# Patient Record
Sex: Female | Born: 1980
Health system: Southern US, Community
[De-identification: ages and names within clinical notes are randomized; demographics above are authoritative.]

## PROBLEM LIST (undated history)

## (undated) DIAGNOSIS — E7800 Pure hypercholesterolemia, unspecified: Secondary | ICD-10-CM

## (undated) DIAGNOSIS — Z789 Other specified health status: Secondary | ICD-10-CM

## (undated) HISTORY — PX: NO PAST SURGERIES: SHX2092

---

## 2007-06-09 ENCOUNTER — Inpatient Hospital Stay (HOSPITAL_COMMUNITY): Admission: AD | Admit: 2007-06-09 | Discharge: 2007-06-09 | Payer: Self-pay | Admitting: Obstetrics and Gynecology

## 2007-06-09 IMAGING — US US OB COMP LESS 14 WK
1 series · 14 of 28 positions shown · non-contrast
Comparison: none

OBSTETRICAL ULTRASOUND:

 This ultrasound exam was performed in the [HOSPITAL] Ultrasound Department.  The OB US report was generated in the AS system, and faxed to the ordering physician.  This report is also available in [REDACTED] PACS.

[Series 1: us ob comp less 14 wk · 0.26mm/px · 14 of 39 slices shown]
[im 2/39]
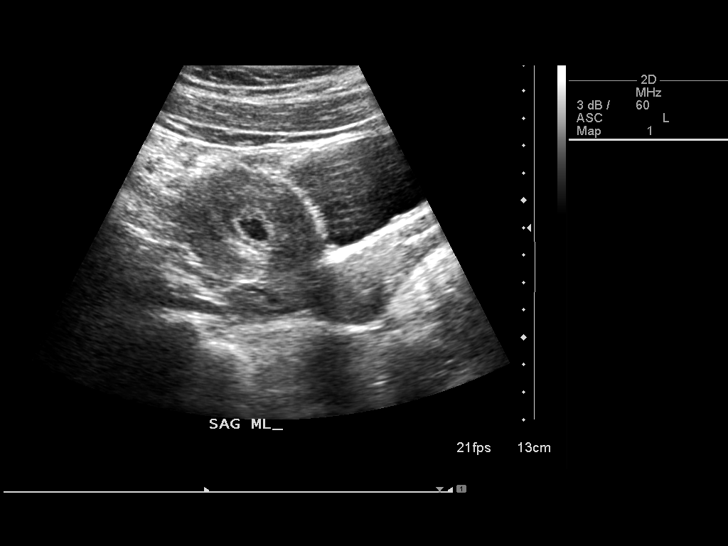
[im 5/39]
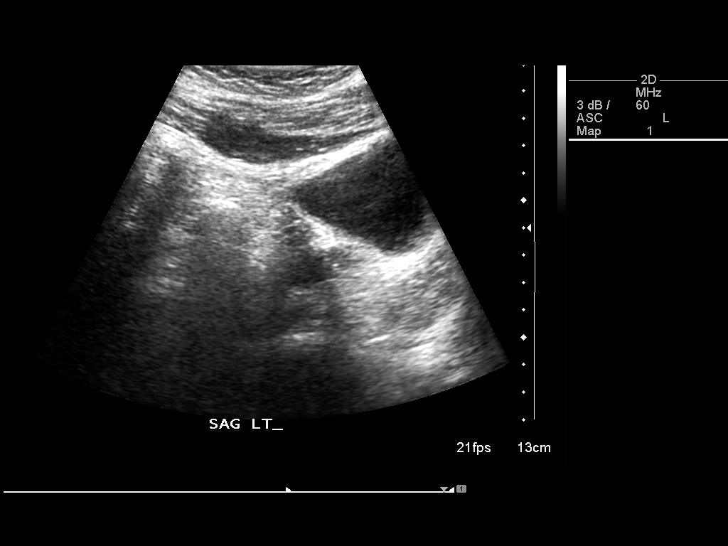
[im 8/39]
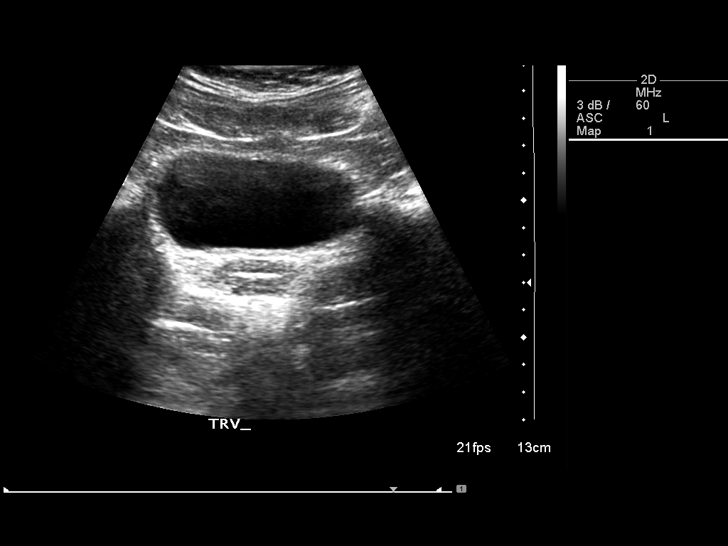
[im 10/39]
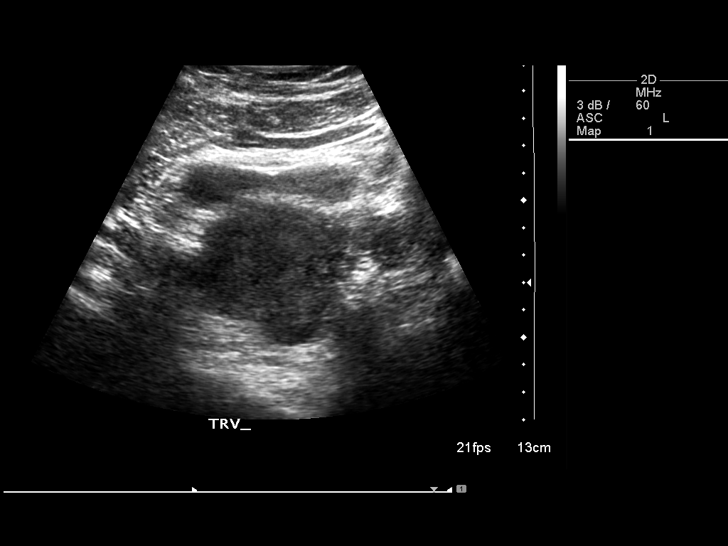
[im 13/39]
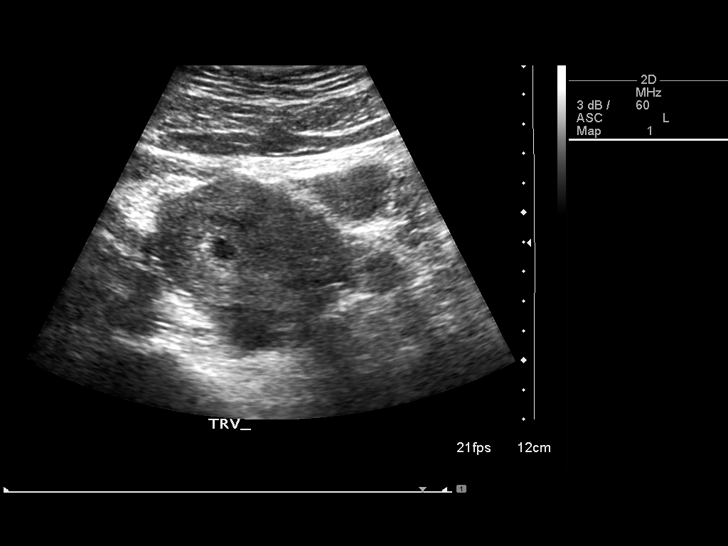
[im 16/39]
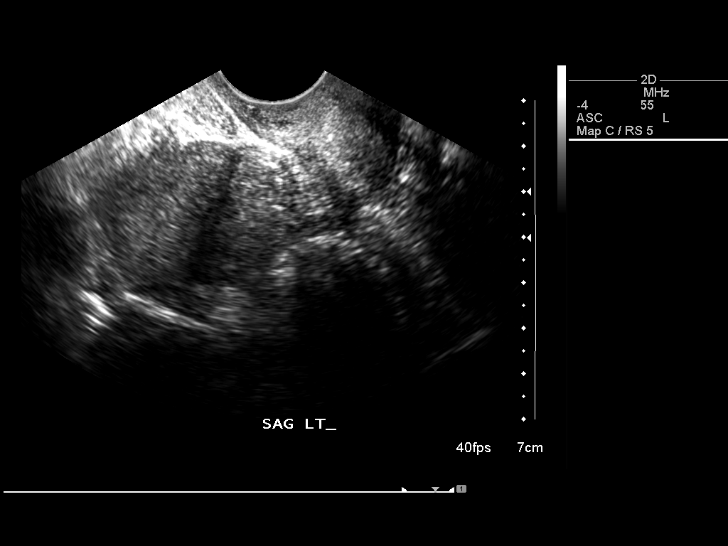
[im 19/39]
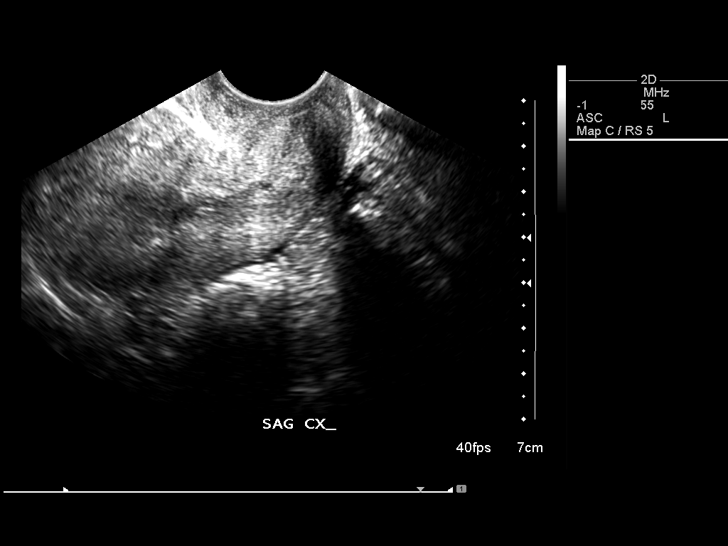
[im 22/39]
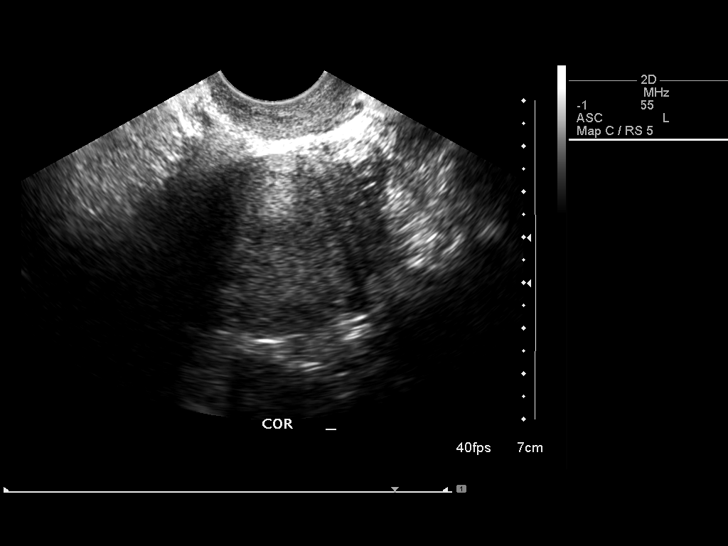
[im 24/39]
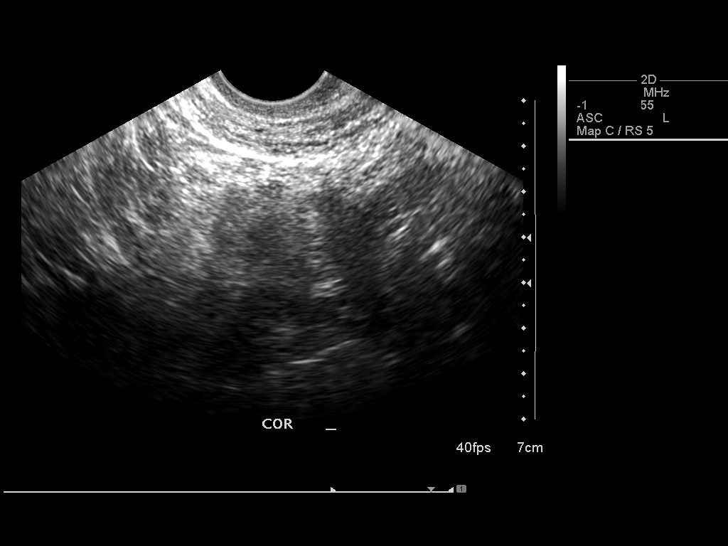
[im 27/39]
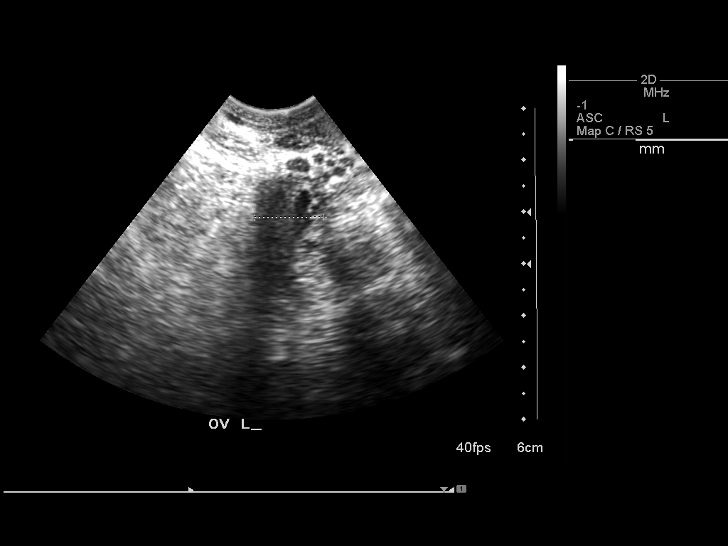
[im 30/39]
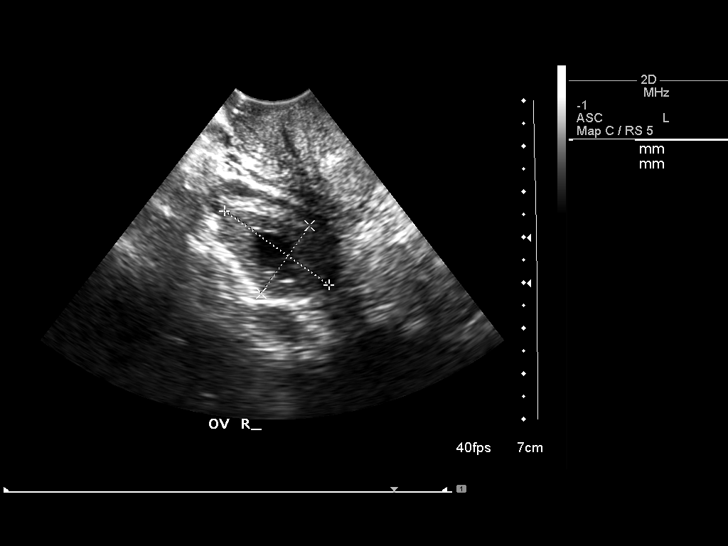
[im 33/39]
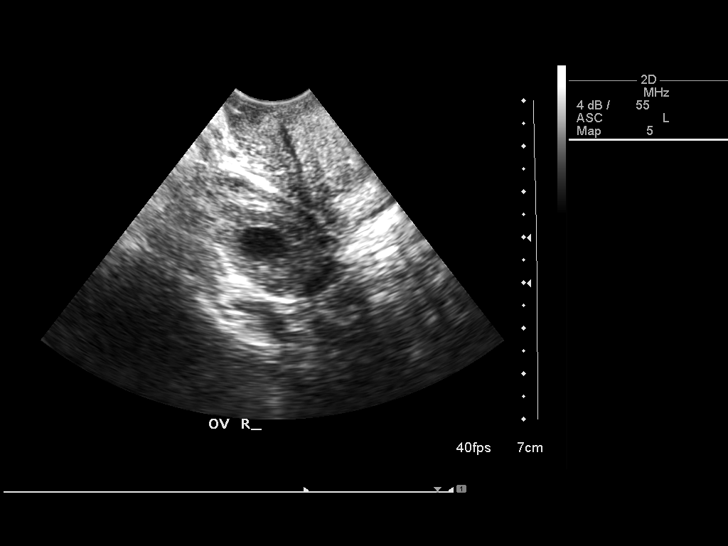
[im 36/39]
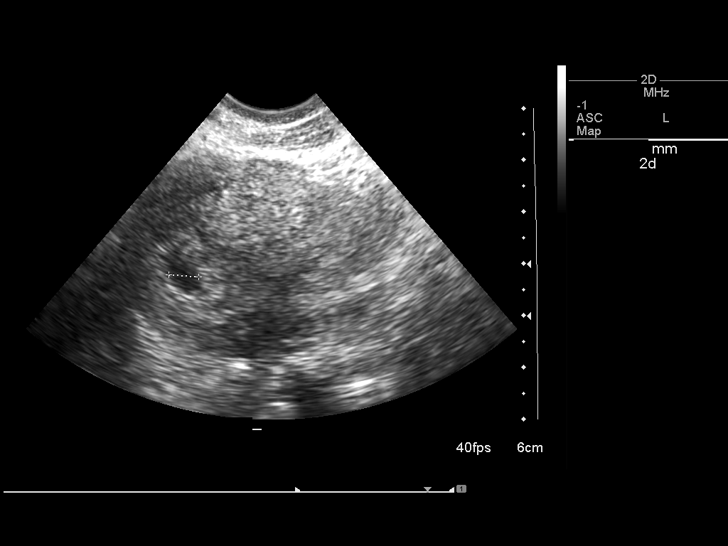
[im 39/39]
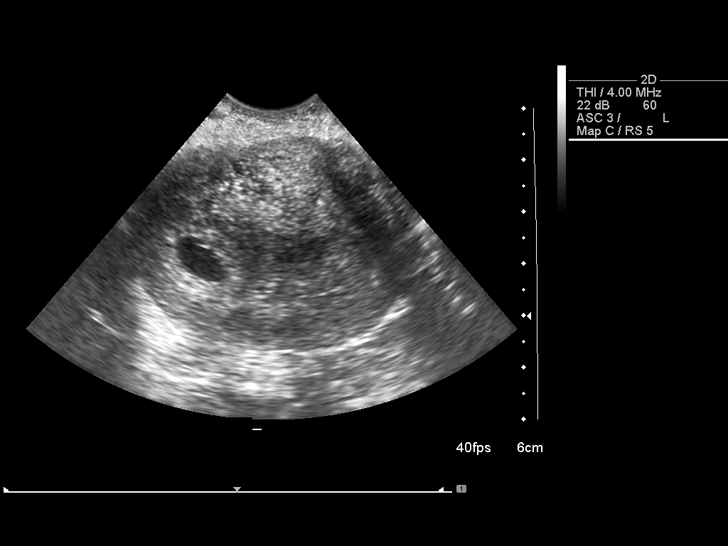

[14 of 28 positions shown; findings below may reference images not displayed]

IMPRESSION: See AS Obstetric US report.

## 2008-01-08 ENCOUNTER — Inpatient Hospital Stay (HOSPITAL_COMMUNITY): Admission: AD | Admit: 2008-01-08 | Discharge: 2008-01-11 | Payer: Self-pay | Admitting: Obstetrics

## 2009-11-13 ENCOUNTER — Emergency Department (HOSPITAL_COMMUNITY): Admission: EM | Admit: 2009-11-13 | Discharge: 2009-11-13 | Payer: Self-pay | Admitting: Emergency Medicine

## 2009-11-13 IMAGING — CR DG ABDOMEN 1V
1 series · 1 of 1 positions shown · non-contrast
Comparison: None

CLINICAL DATA: Left side abdominal pain for 2 years.

ABDOMEN - 1 VIEW

[view not recorded]
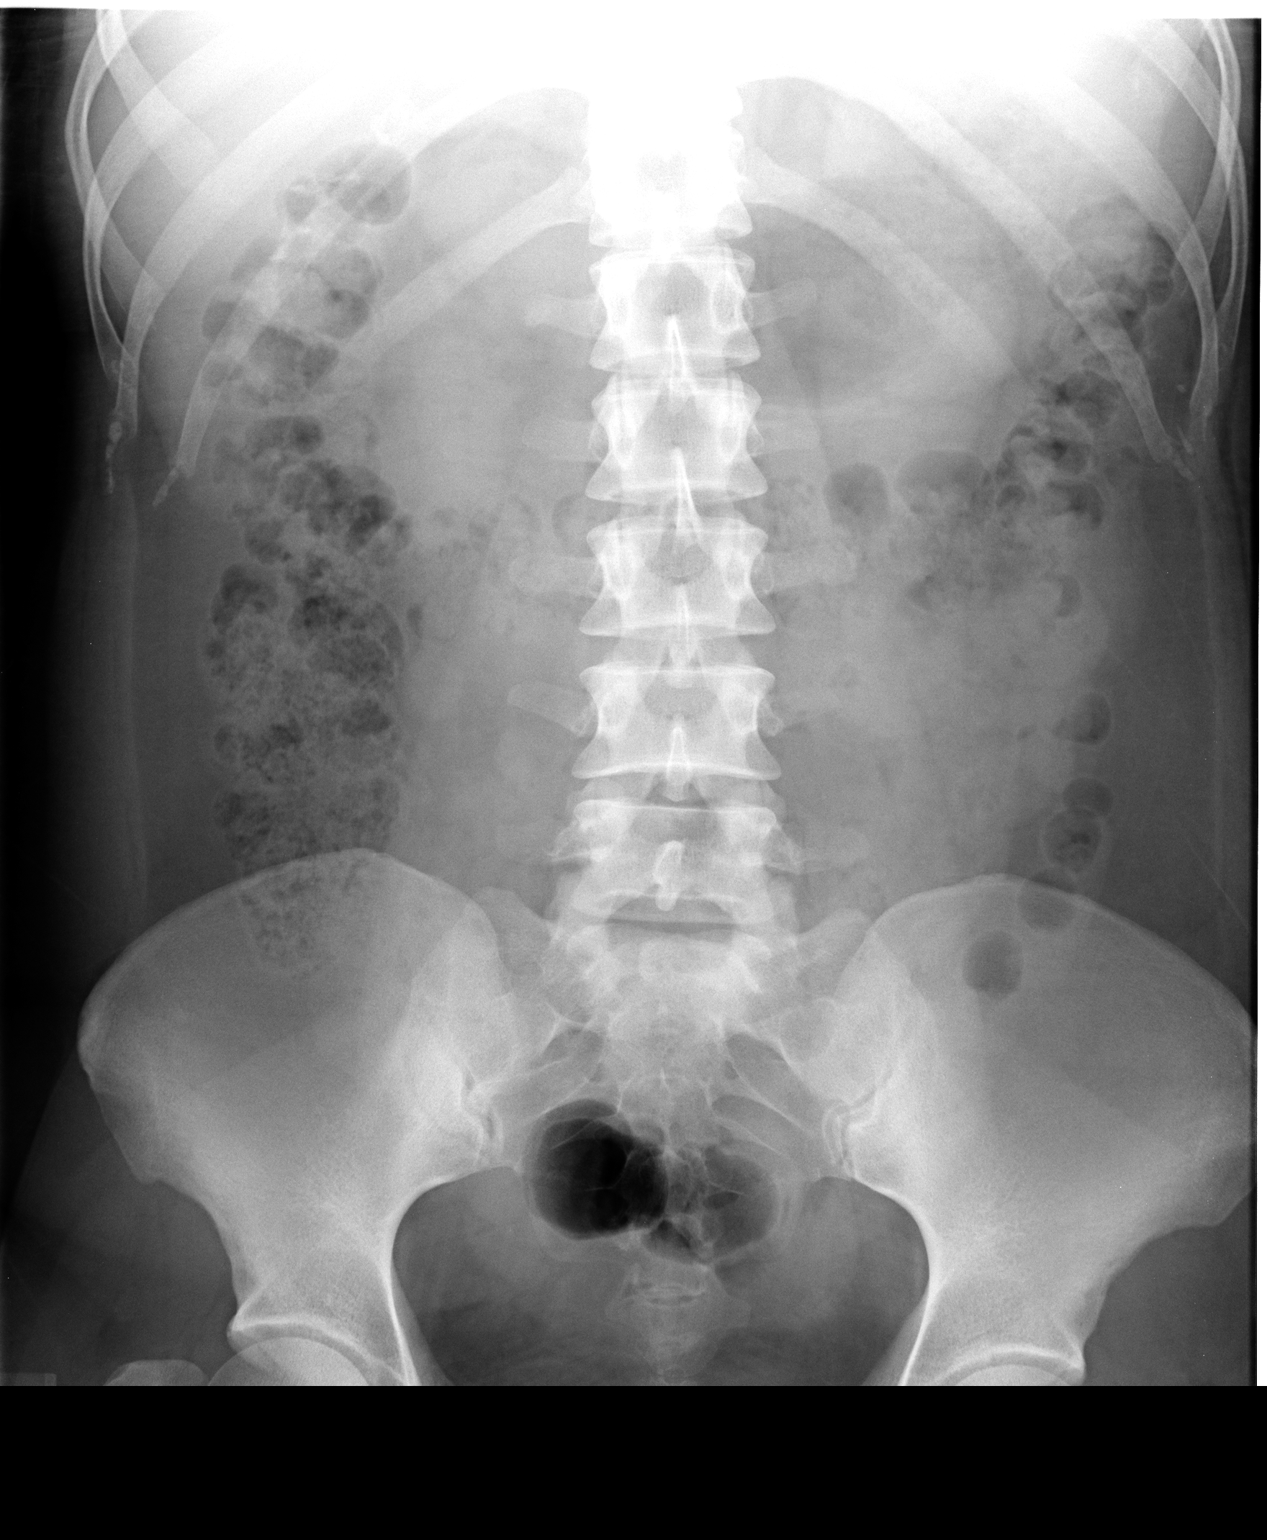

[1 of 1 positions shown; findings below may reference images not displayed]

FINDINGS: Bowel gas pattern is normal.  No ileus or bowel
obstruction.  Moderately generous amounts stool is present in the
colon.  No unusual calcification.  The properitoneal fat stripes
and psoas muscle margins are defined.
IMPRESSION: No acute abdominal process.

## 2010-03-13 ENCOUNTER — Encounter (INDEPENDENT_AMBULATORY_CARE_PROVIDER_SITE_OTHER): Payer: Self-pay | Admitting: Family Medicine

## 2010-03-13 ENCOUNTER — Ambulatory Visit: Payer: Self-pay | Admitting: Internal Medicine

## 2010-03-13 LAB — CONVERTED CEMR LAB
Chlamydia, Swab/Urine, PCR: NEGATIVE
GC Probe Amp, Urine: NEGATIVE

## 2010-03-18 ENCOUNTER — Ambulatory Visit (HOSPITAL_COMMUNITY): Admission: RE | Admit: 2010-03-18 | Discharge: 2010-03-18 | Payer: Self-pay | Admitting: Internal Medicine

## 2010-03-18 IMAGING — CT CT ABD-PELV W/O CM
1 of 2 series · 16 of 32 positions shown, 20 images · non-contrast
Comparison: None.

CLINICAL DATA: Left lower quadrant pain and hematuria.

CT ABDOMEN AND PELVIS WITHOUT CONTRAST
TECHNIQUE: Multidetector CT imaging of the abdomen and pelvis was
performed following the standard protocol without intravenous
contrast.

[Series 2: stone · axial · 0.75mm/px · z∈[-490,-40]mm · 16 of 98 slices shown, 20 images]
[im 4/98  soft-tissue]
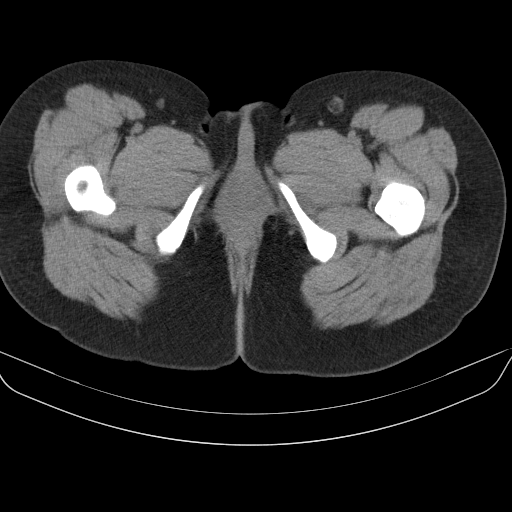
[im 4/98  bone]
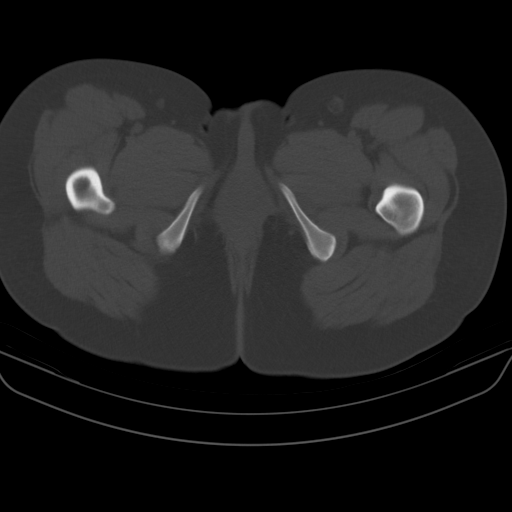
[im 12/98  soft-tissue]
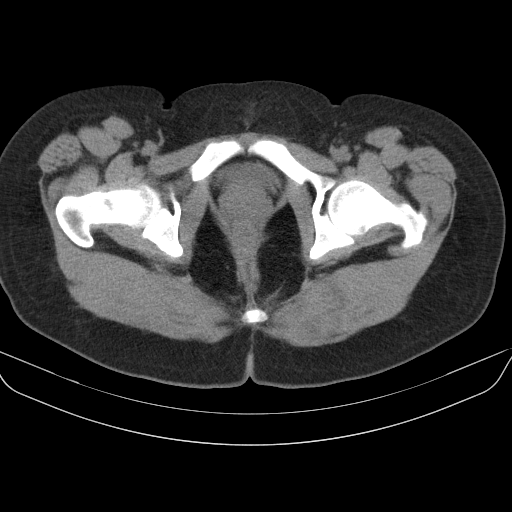
[im 19/98  soft-tissue]
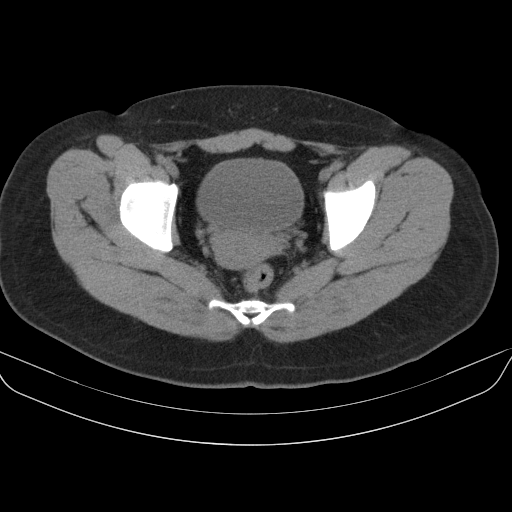
[im 27/98  soft-tissue]
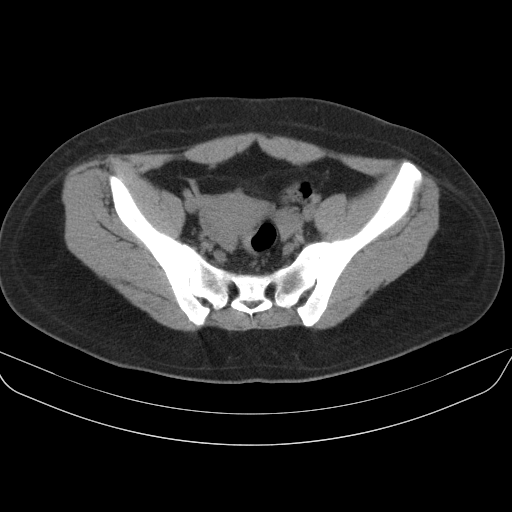
[im 34/98  soft-tissue]
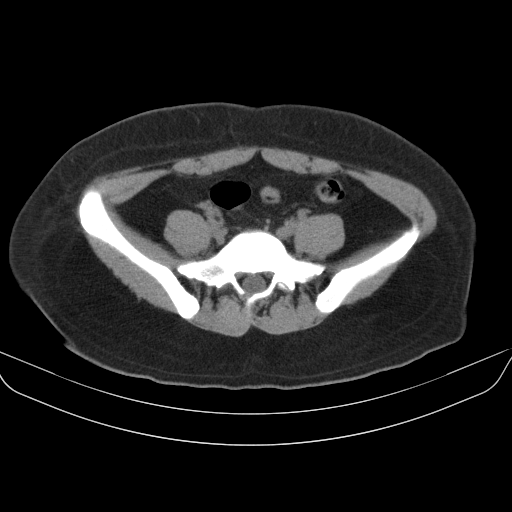
[im 38/98  soft-tissue]
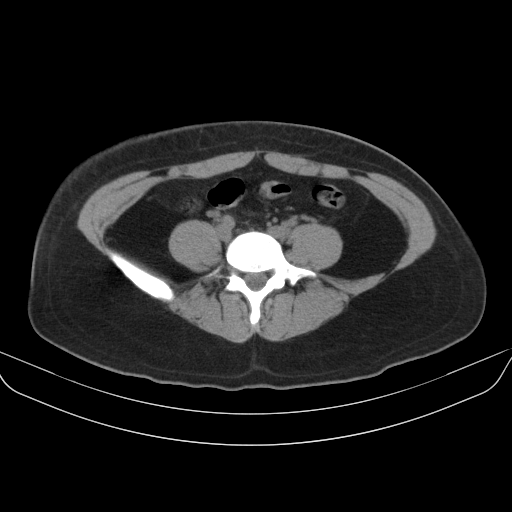
[im 45/98  soft-tissue]
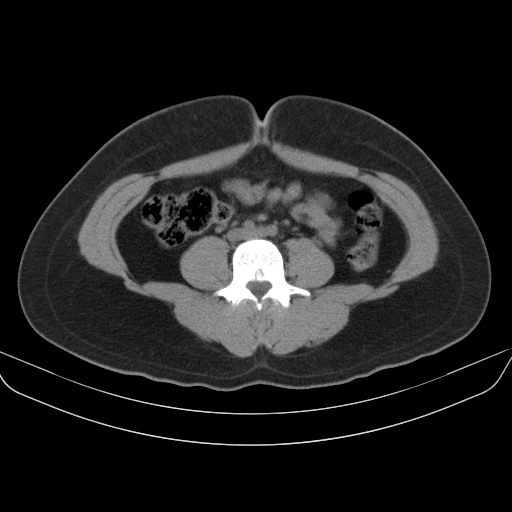
[im 53/98  soft-tissue]
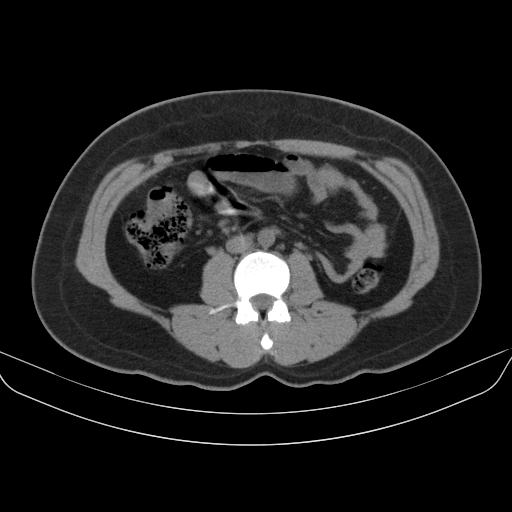
[im 60/98  soft-tissue]
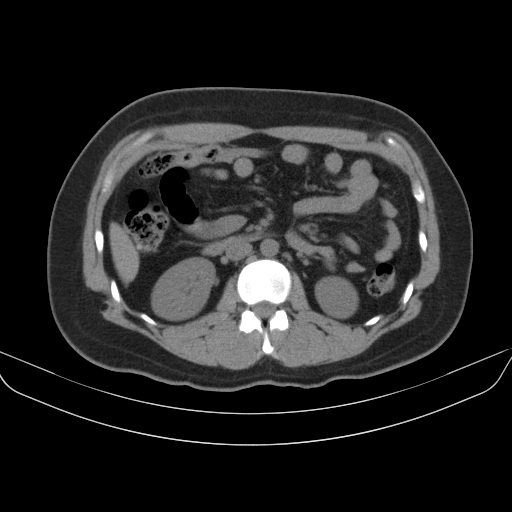
[im 60/98  bone]
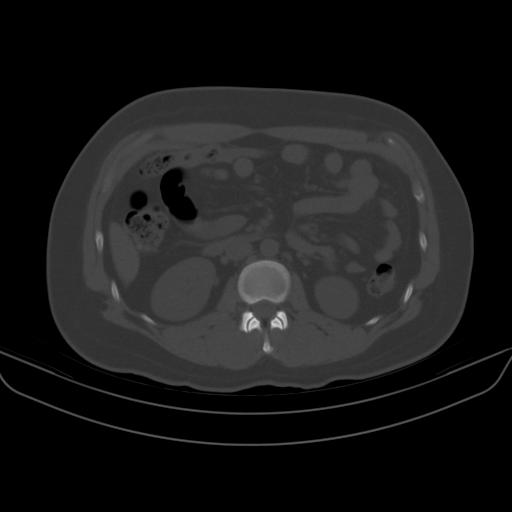
[im 64/98  soft-tissue]
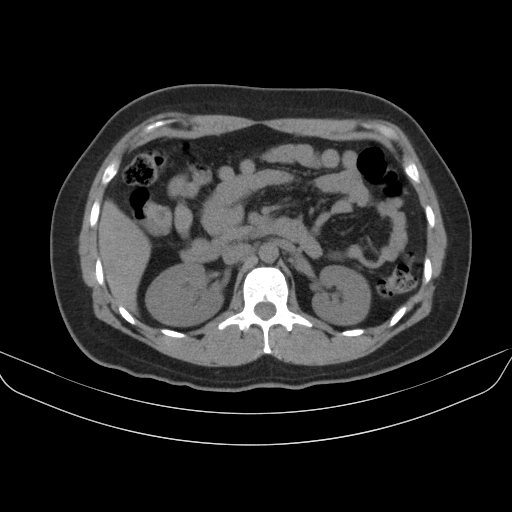
[im 71/98  soft-tissue]
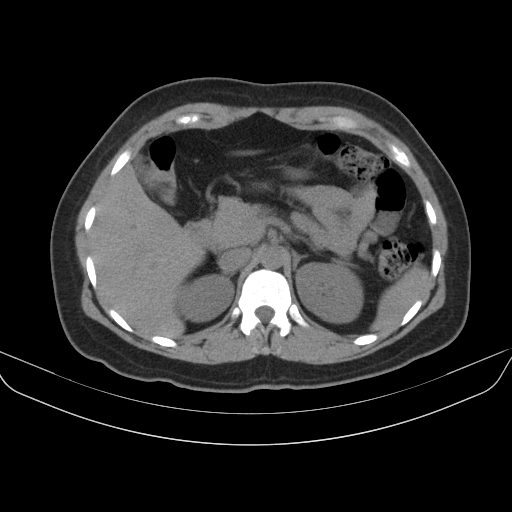
[im 79/98  soft-tissue]
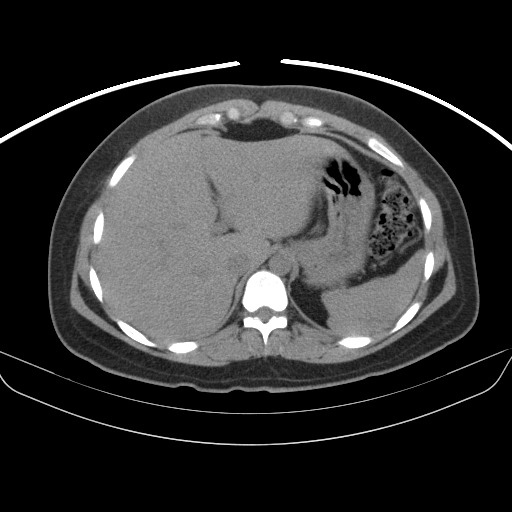
[im 83/98  lung]
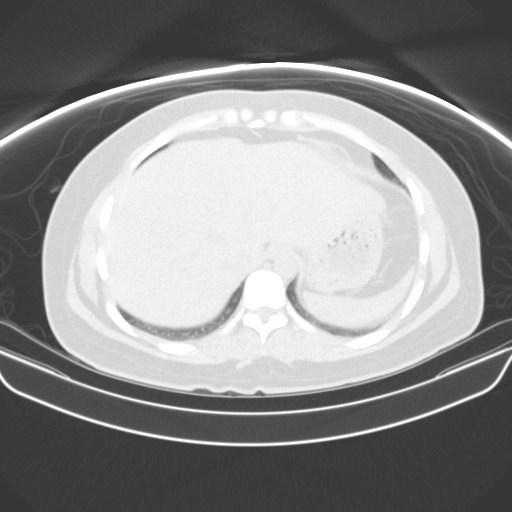
[im 86/98  soft-tissue]
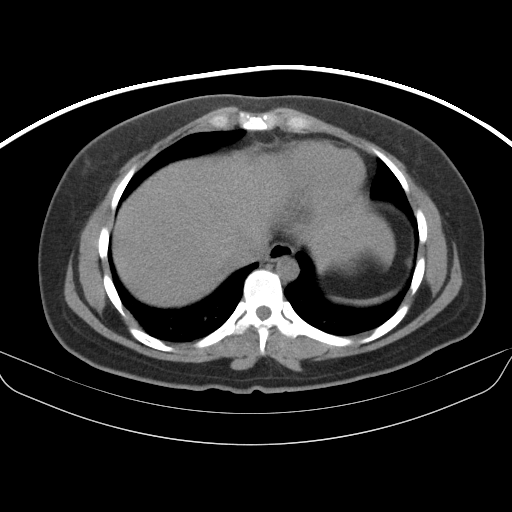
[im 86/98  lung]
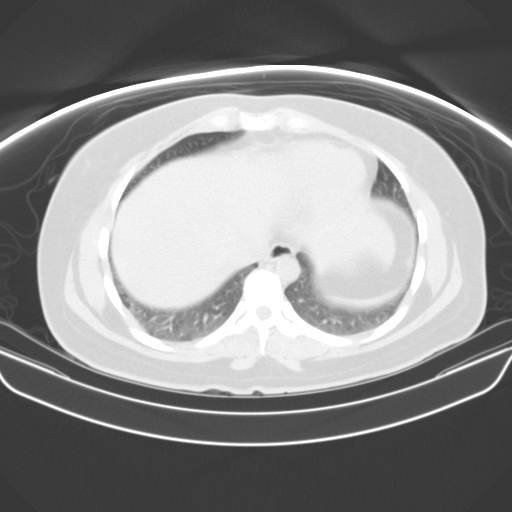
[im 90/98  lung]
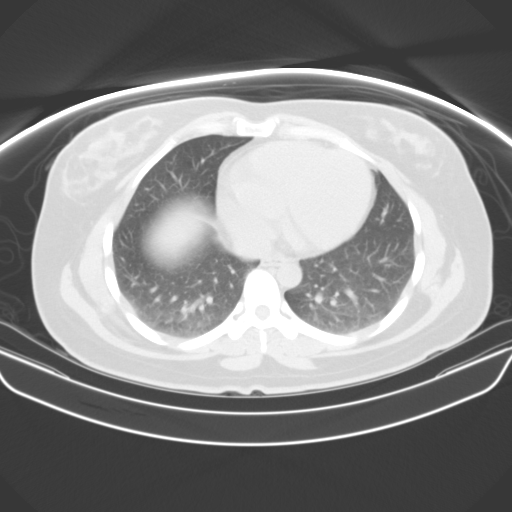
[im 94/98  soft-tissue]
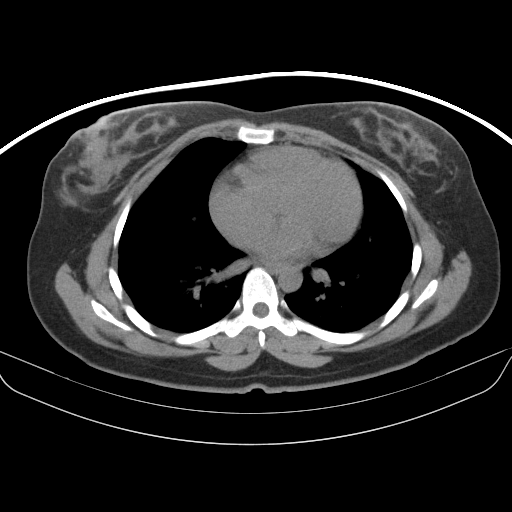
[im 94/98  lung]
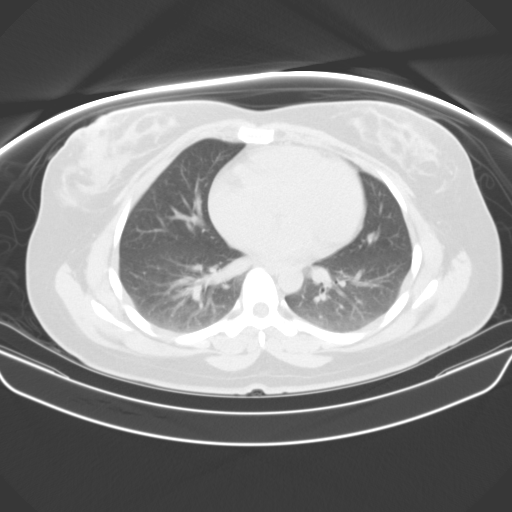

[16 of 32 positions shown; findings below may reference images not displayed]

FINDINGS: Lung bases show mild dependent atelectasis.  Heart is at
the upper limits of normal in size.  No pericardial or pleural
effusion.

Liver, gallbladder, adrenal glands, kidneys, spleen, pancreas,
stomach and bowel are unremarkable.  A 6 mm low attenuation lesion
in the region of the cervix may represent a Nabothian cyst.
Ovaries are visualized.  No pathologically enlarged lymph nodes.
No free fluid.  Bladder is unremarkable.  No worrisome lytic or
sclerotic lesions.
IMPRESSION: No acute findings.  No urinary stones or hydronephrosis.

## 2010-03-20 ENCOUNTER — Encounter (INDEPENDENT_AMBULATORY_CARE_PROVIDER_SITE_OTHER): Payer: Self-pay | Admitting: Family Medicine

## 2010-03-20 ENCOUNTER — Ambulatory Visit: Payer: Self-pay | Admitting: Internal Medicine

## 2010-03-20 LAB — CONVERTED CEMR LAB
ALT: 20 U/L
AST: 20 U/L
Albumin: 4.3 g/dL
Alkaline Phosphatase: 84 U/L
BUN: 13 mg/dL
Basophils Absolute: 0 K/uL
Basophils Relative: 0 %
CO2: 22 meq/L
Calcium: 8.9 mg/dL
Chloride: 105 meq/L
Cholesterol: 167 mg/dL
Creatinine, Ser: 0.71 mg/dL
Eosinophils Absolute: 0.1 K/uL
Eosinophils Relative: 2 %
Glucose, Bld: 90 mg/dL
HCT: 39.6 %
HDL: 43 mg/dL
Hemoglobin: 12.2 g/dL
LDL Cholesterol: 95 mg/dL
Lymphocytes Relative: 32 %
Lymphs Abs: 2.2 K/uL
MCHC: 30.8 g/dL
MCV: 87.6 fL
Monocytes Absolute: 0.6 K/uL
Monocytes Relative: 9 %
Neutro Abs: 4.1 K/uL
Neutrophils Relative %: 58 %
Platelets: 306 K/uL
Potassium: 4.2 meq/L
RBC: 4.52 M/uL
RDW: 14 %
Sed Rate: 8 mm/h
Sodium: 141 meq/L
Total Bilirubin: 0.3 mg/dL
Total CHOL/HDL Ratio: 3.9
Total Protein: 7.3 g/dL
Triglycerides: 145 mg/dL
VLDL: 29 mg/dL
WBC: 7 10*3/microliter

## 2010-03-26 ENCOUNTER — Encounter (INDEPENDENT_AMBULATORY_CARE_PROVIDER_SITE_OTHER): Payer: Self-pay | Admitting: Family Medicine

## 2010-03-26 ENCOUNTER — Ambulatory Visit: Payer: Self-pay | Admitting: Internal Medicine

## 2010-03-26 LAB — CONVERTED CEMR LAB
CRP: 3.6 mg/dL — ABNORMAL HIGH (ref <0.6)
Chlamydia, Swab/Urine, PCR: NEGATIVE
GC Probe Amp, Urine: NEGATIVE
Lipase: 21 units/L (ref 0–75)
Sed Rate: 20 mm/hr (ref 0–22)
TSH: 2.349 microintl units/mL (ref 0.350–4.500)

## 2010-04-03 ENCOUNTER — Ambulatory Visit (HOSPITAL_COMMUNITY): Admission: RE | Admit: 2010-04-03 | Discharge: 2010-04-03 | Payer: Self-pay | Admitting: Family Medicine

## 2010-04-03 IMAGING — US US PELVIS COMPLETE MODIFY
1 series · 13 of 25 positions shown · non-contrast
Comparison: None.

04/04/2010 - DUPLICATE COPY for exam association in [facility] – No change from original report.
CLINICAL DATA: Bilateral pelvic pain. Oligomenorrhea. LMP
 12/25/2009

 TRANSABDOMINAL AND TRANSVAGINAL ULTRASOUND OF PELVIS
TECHNIQUE: Both transabdominal and transvaginal ultrasound
 examinations of the pelvis were performed including evaluation of
 the uterus, ovaries, adnexal regions, and pelvic cul-de-sac.

[Series 1: us pelvis complete modify · 0.23mm/px · 13 of 60 slices shown]
[im 1/60]
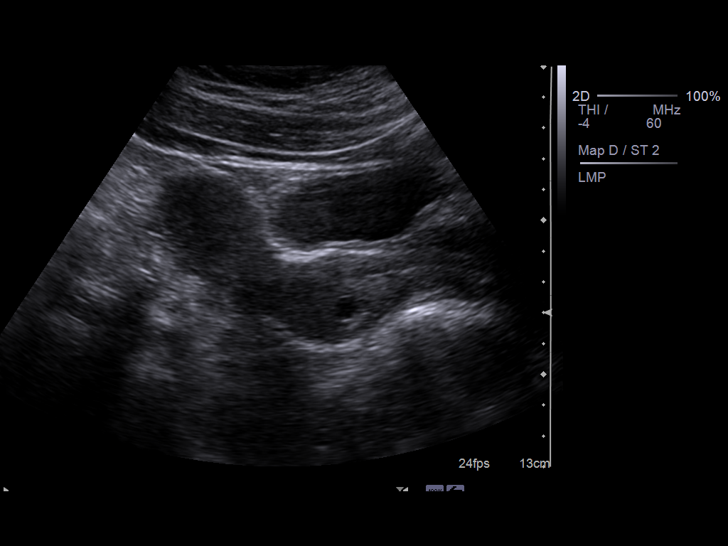
[im 5/60]
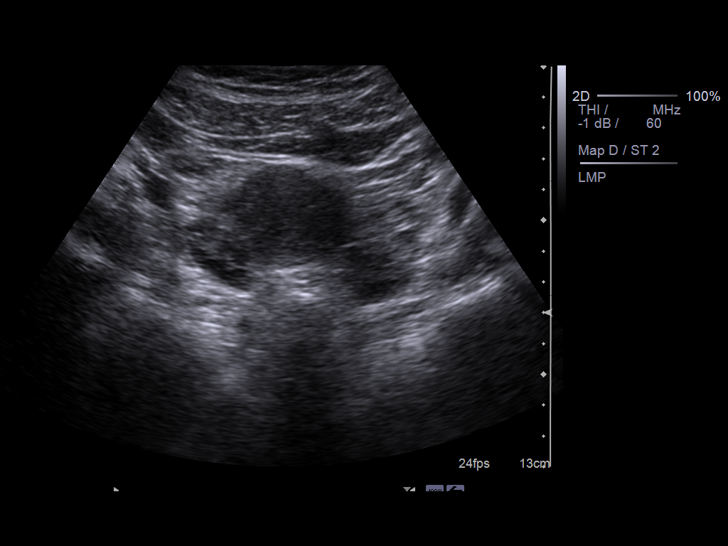
[im 10/60]
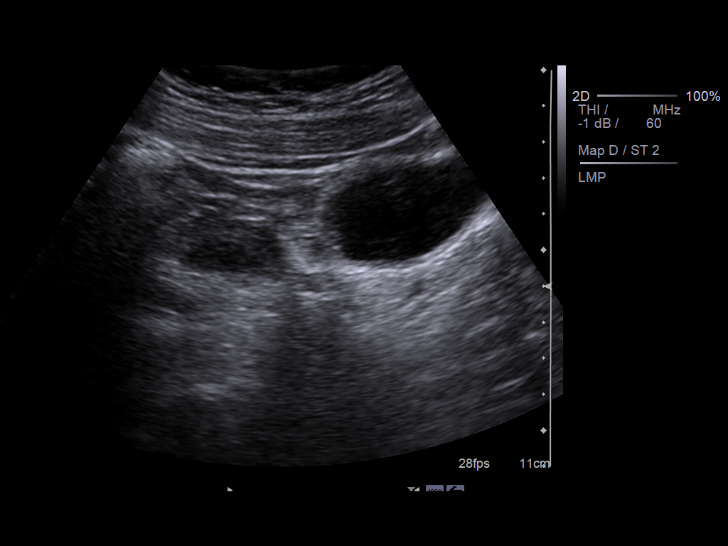
[im 15/60]
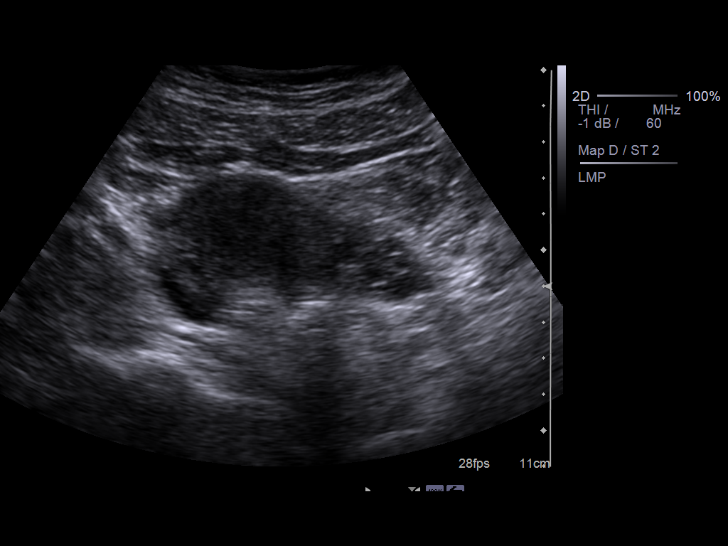
[im 20/60]
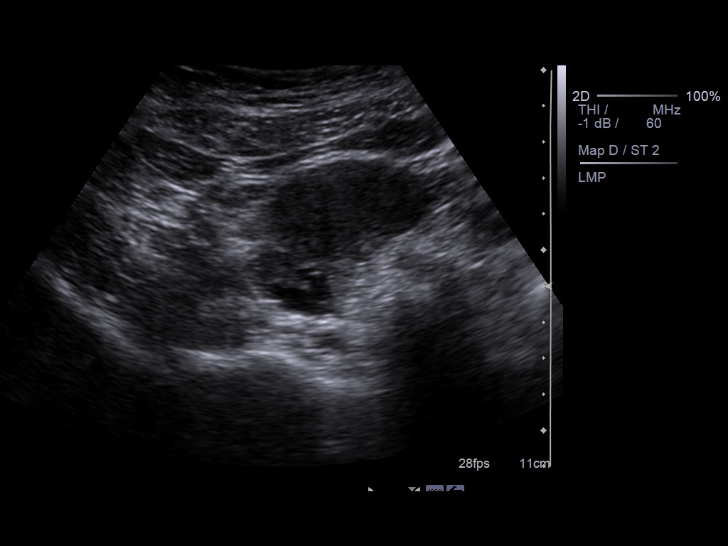
[im 25/60]
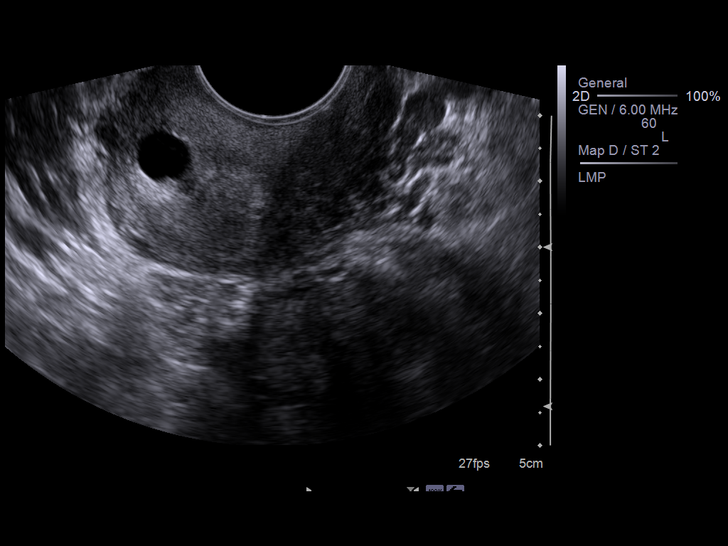
[im 30/60]
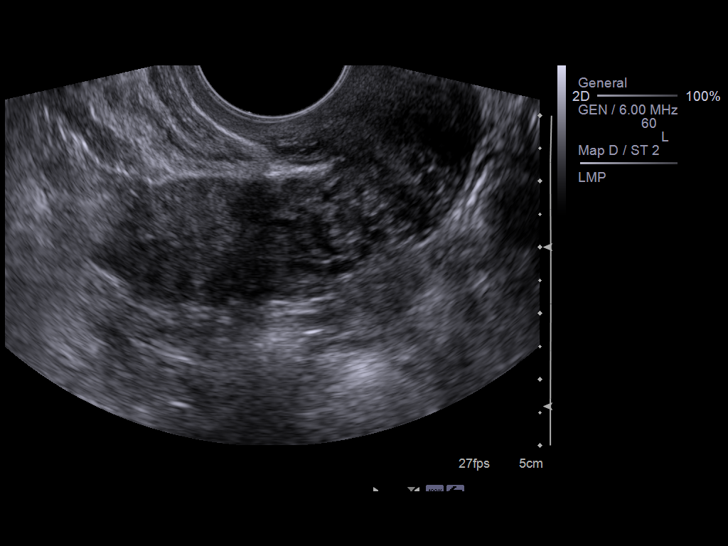
[im 35/60]
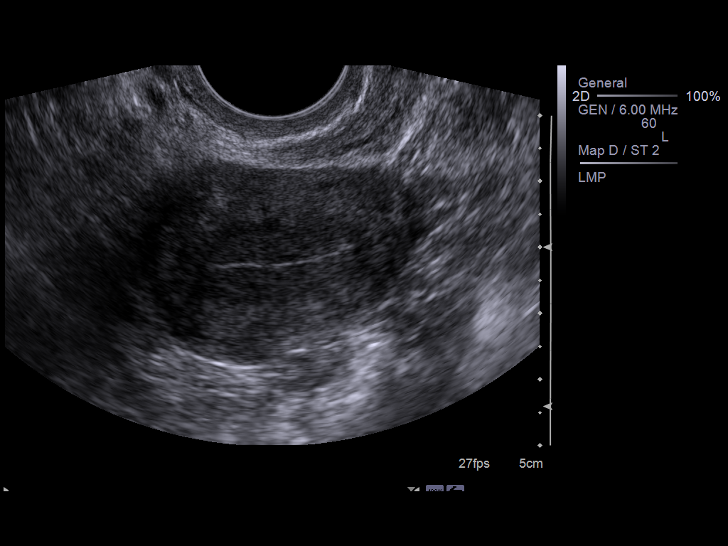
[im 40/60]
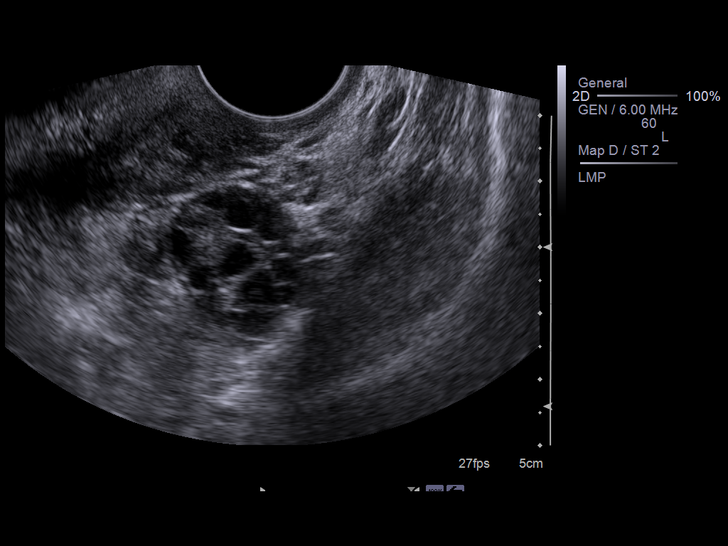
[im 45/60]
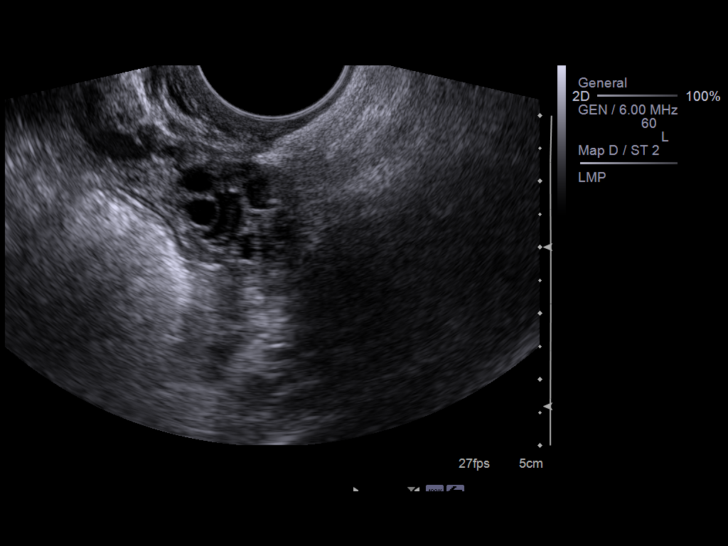
[im 50/60]
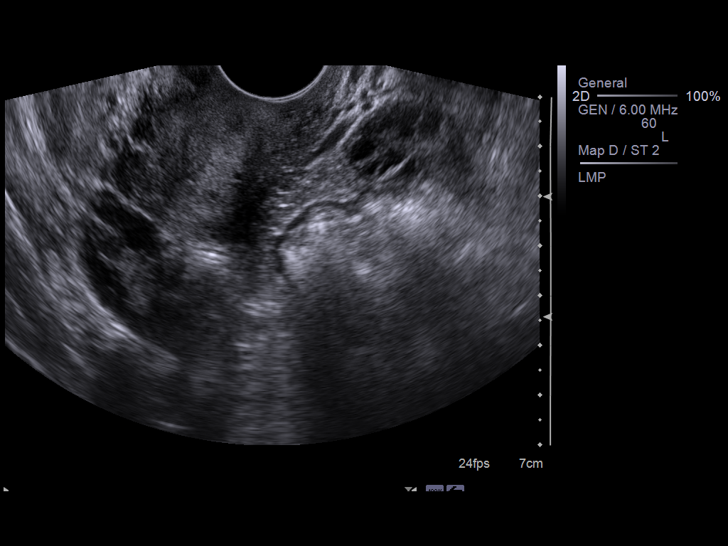
[im 55/60]
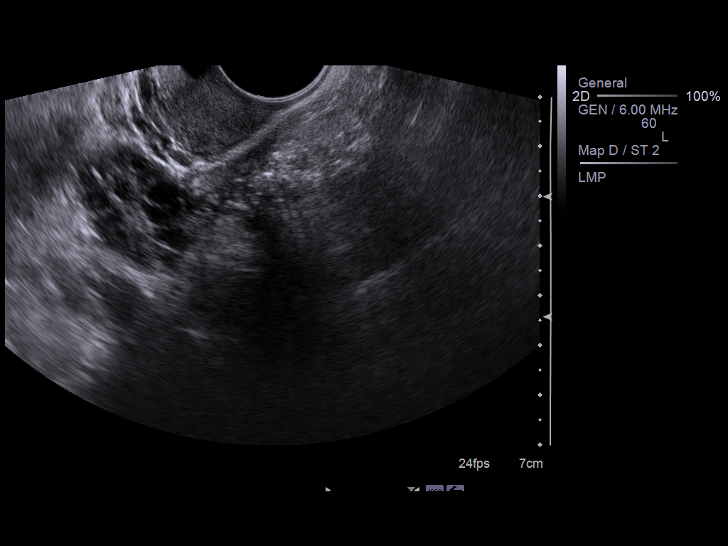
[im 60/60]
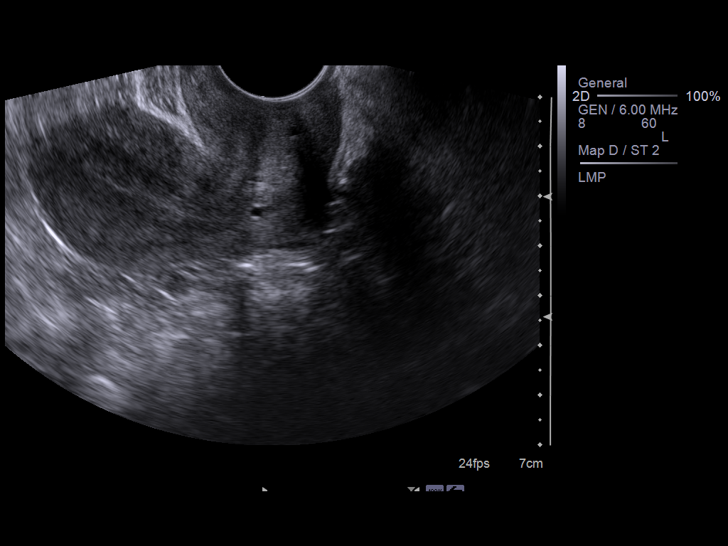

[13 of 25 positions shown; findings below may reference images not displayed]

FINDINGS: Uterus measures 7.1 x 3.0 x 4.0 cm. No fibroids or other uterine
 masses identified.

 Endometrium measures 8 mm in thickness. Within normal limits in
 appearance.

 Right Ovary measures 2.9 x 1.9 x 1.7 cm. Numerous small less than 1
 cm follicles, without dominant follicle or corpus luteum. No
 evidence of ovarian mass.

 Left Ovary measures 2.8 x 2.4 x 2.1 cm. Numerous small less than 1
 cm follicles, without dominant follicle or corpus luteum. No
 evidence of ovarian mass.

 Other Findings: No other abnormality identified.
IMPRESSION: 1. No evidence of pelvic mass or other acute findings.
 2. Numerous small bilateral ovarian follicles, without evidence of
 dominant follicle or corpus luteum. These findings can be seen
 with polycystic ovary syndrome; recommend clinical correlation and
 consider biochemical testing.

## 2010-05-20 ENCOUNTER — Ambulatory Visit: Payer: Self-pay | Admitting: Internal Medicine

## 2010-06-27 ENCOUNTER — Encounter (INDEPENDENT_AMBULATORY_CARE_PROVIDER_SITE_OTHER): Payer: Self-pay | Admitting: *Deleted

## 2010-06-27 ENCOUNTER — Ambulatory Visit: Payer: Self-pay | Admitting: Obstetrics and Gynecology

## 2010-06-27 LAB — CONVERTED CEMR LAB: hCG, Beta Chain, Quant, S: 137108.2 milliintl units/mL

## 2010-07-02 ENCOUNTER — Ambulatory Visit (HOSPITAL_COMMUNITY): Admission: RE | Admit: 2010-07-02 | Discharge: 2010-07-02 | Payer: Self-pay | Admitting: Family Medicine

## 2010-07-02 IMAGING — US US OB COMP LESS 14 WK
1 series · 14 of 28 positions shown · non-contrast
Comparison: none

OBSTETRICAL ULTRASOUND:
 This ultrasound exam was performed in the [HOSPITAL] Ultrasound Department.  The OB US report was generated in the AS system, and faxed to the ordering physician.  This report is also available in [HOSPITAL]?s Access[employer] and in [REDACTED] PACS.

[Series 1: us ob transvaginal modify · 0.17mm/px · 32 acquisitions, 14 frames shown]
[im 2/32]
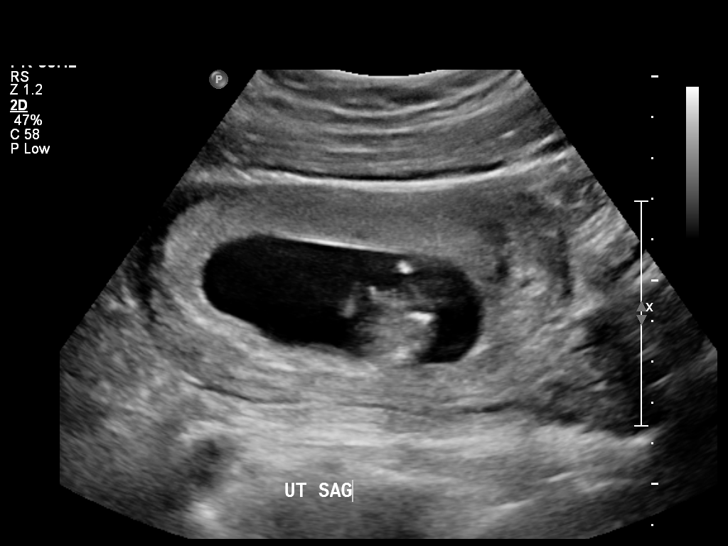
[im 4/32]
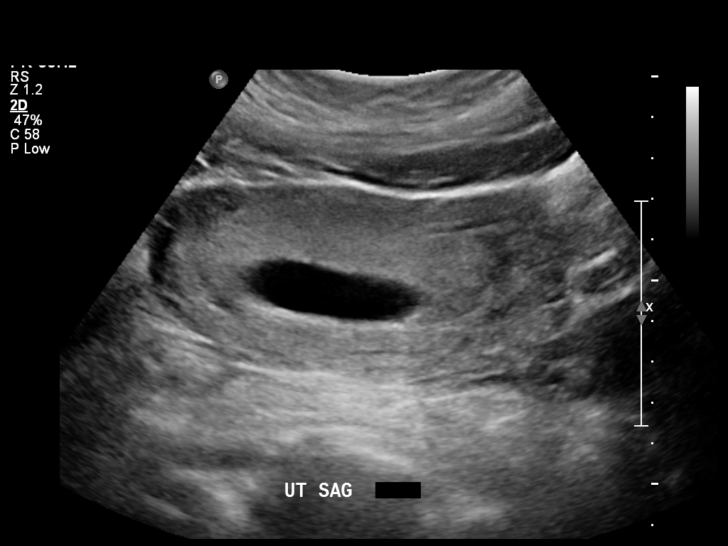
[im 6/32]
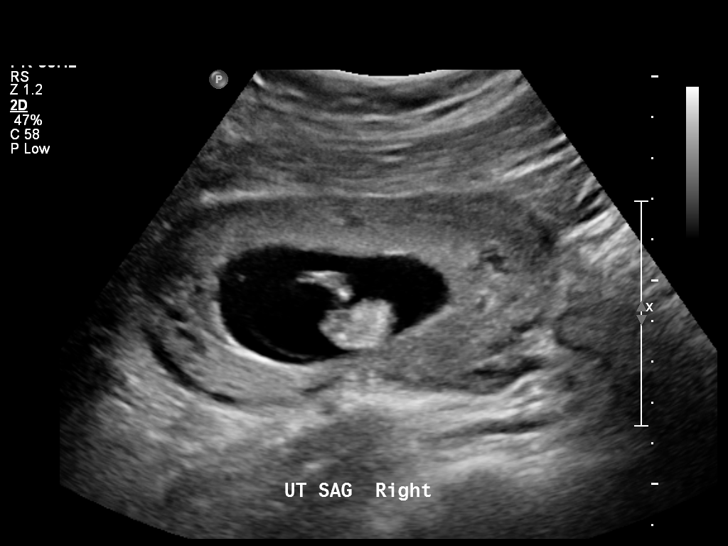
[im 9/32]
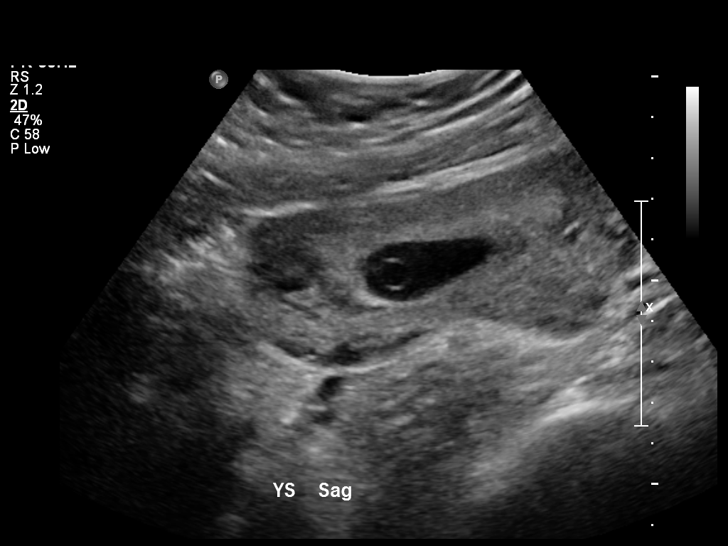
[im 11/32]
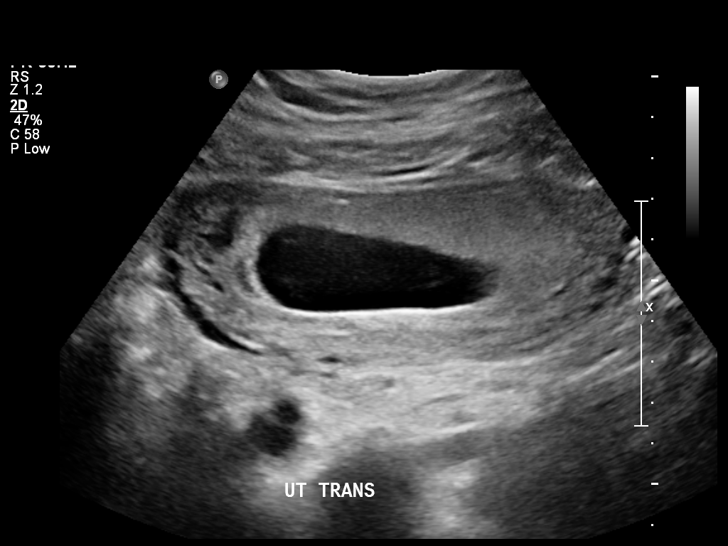
[im 13/32]
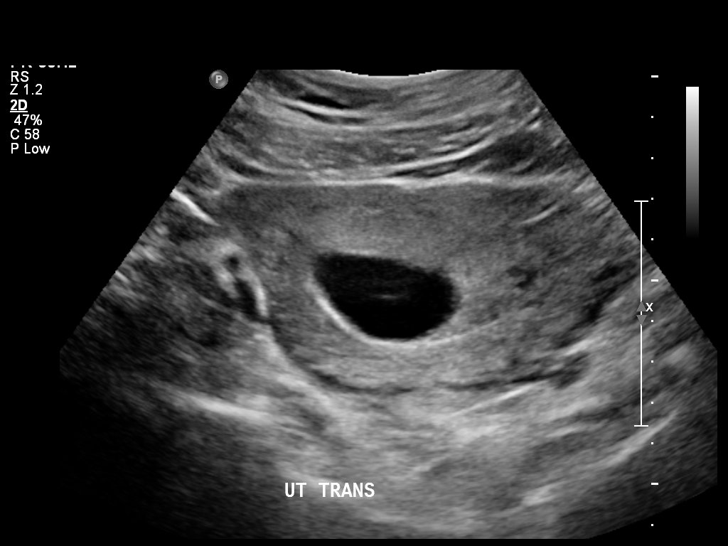
[im 15/32]
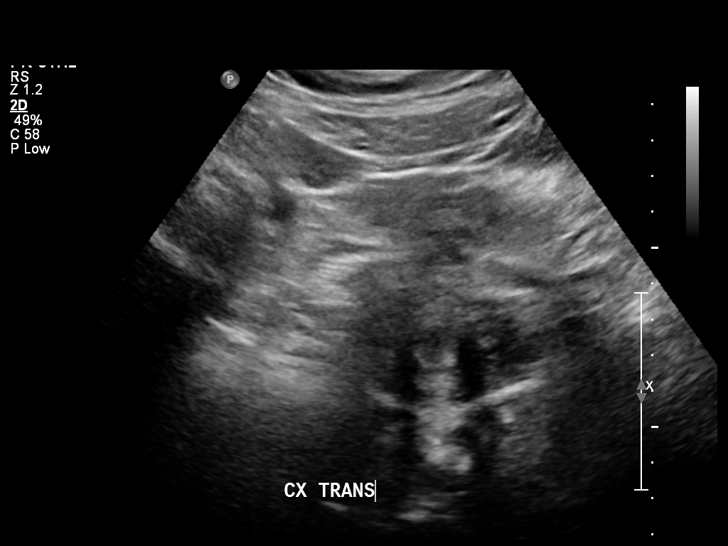
[im 18/32]
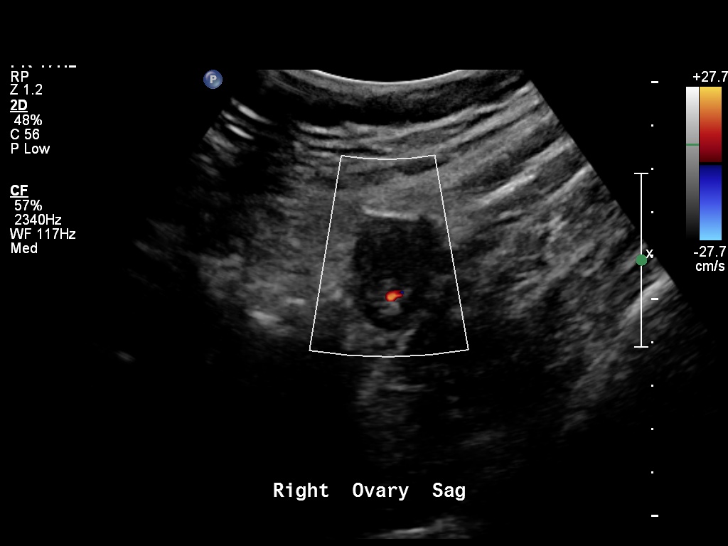
[im 20/32]
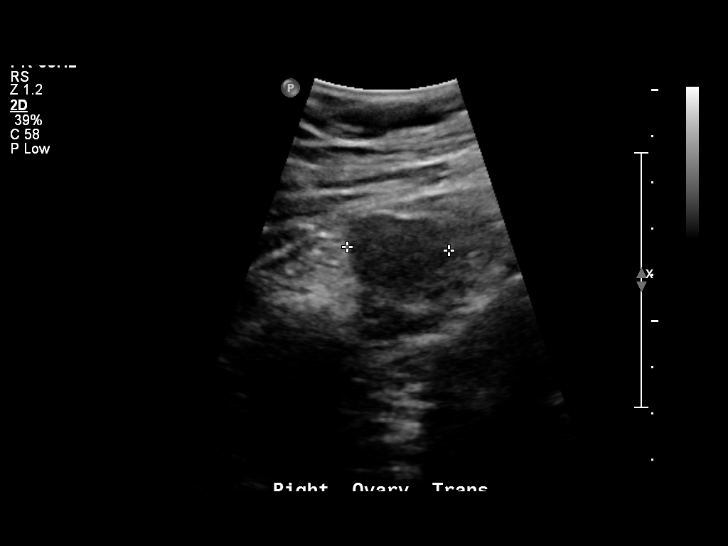
[im 22/32]
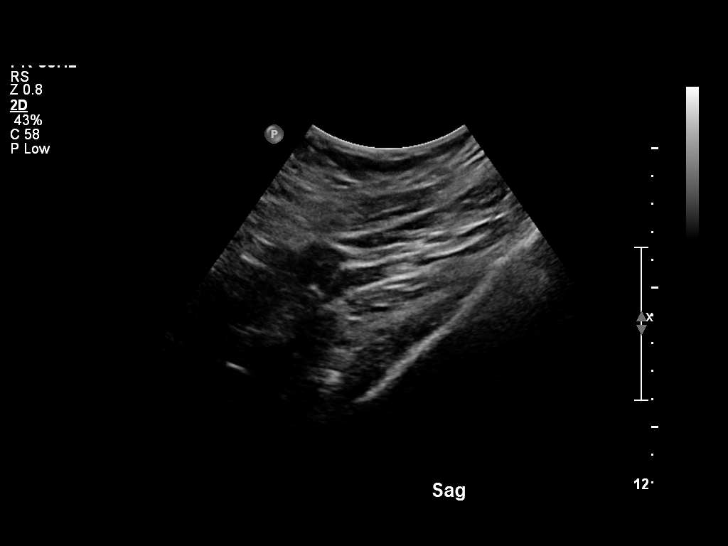
[im 25/32]
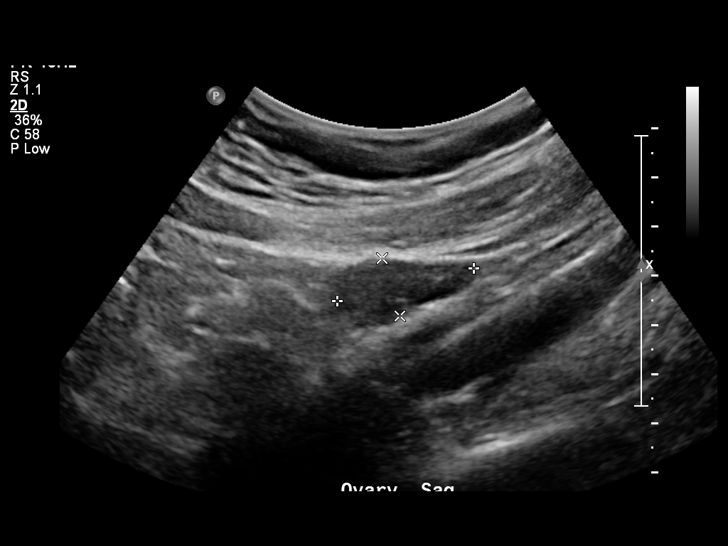
[im 27/32]
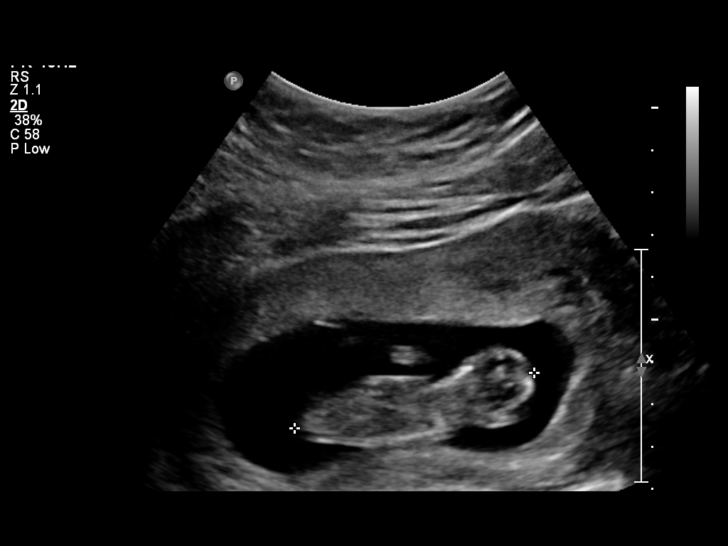
[im 29/32]
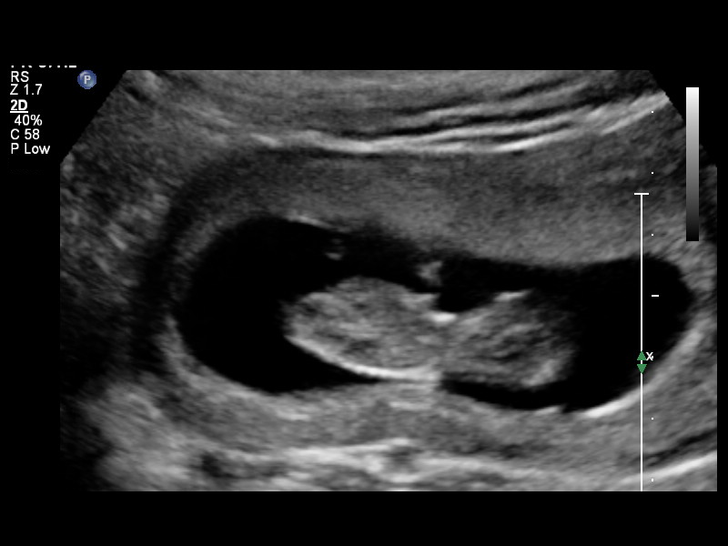
[im 32/32]
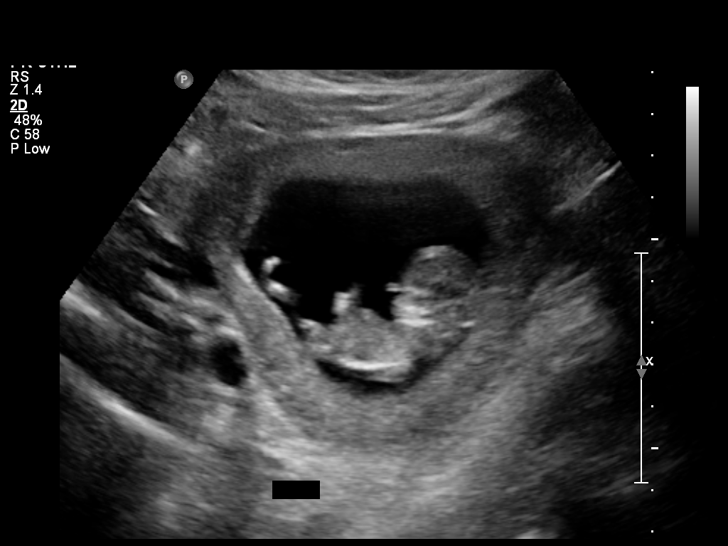

[14 of 28 positions shown; findings below may reference images not displayed]

IMPRESSION: See AS Obstetric US report.

## 2010-08-23 ENCOUNTER — Ambulatory Visit (HOSPITAL_COMMUNITY): Admission: RE | Admit: 2010-08-23 | Discharge: 2010-08-23 | Payer: Self-pay | Admitting: Family Medicine

## 2010-08-23 IMAGING — US US OB DETAIL+14 WK
1 series · 14 of 28 positions shown · non-contrast
Comparison: none

OBSTETRICAL ULTRASOUND:
 This ultrasound exam was performed in the [HOSPITAL] Ultrasound Department.  The OB US report was generated in the AS system, and faxed to the ordering physician.  This report is also available in [HOSPITAL]?s [employer] and in [REDACTED] PACS.

[Series 1: us ob detail +14 wk · 0.24mm/px · 43 acquisitions, 14 frames shown]
[im 2/43]
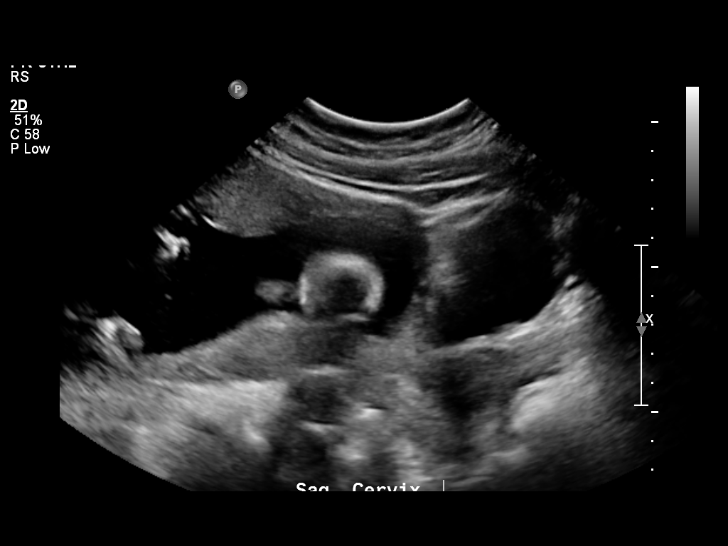
[im 5/43]
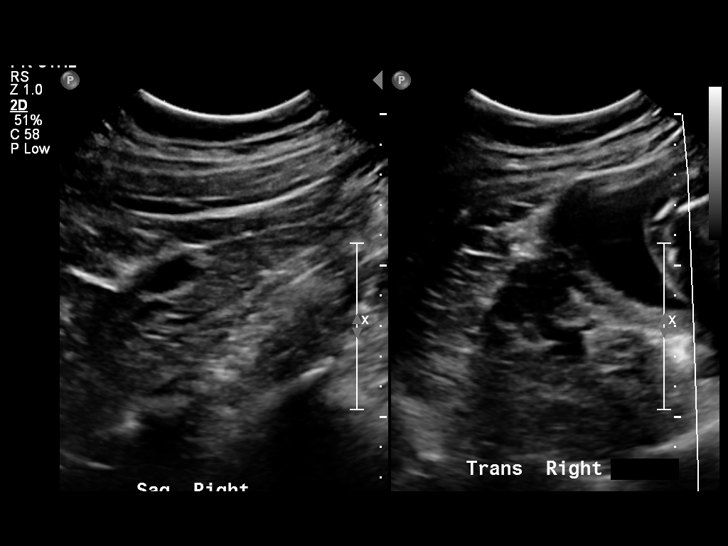
[im 8/43]
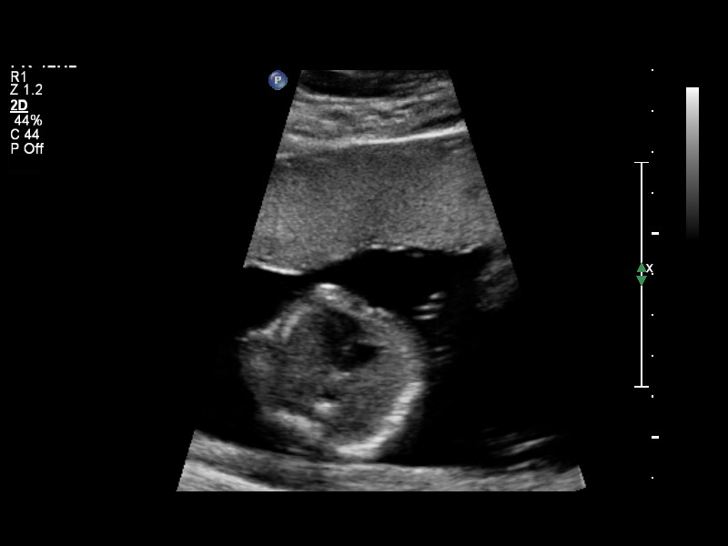
[im 11/43]
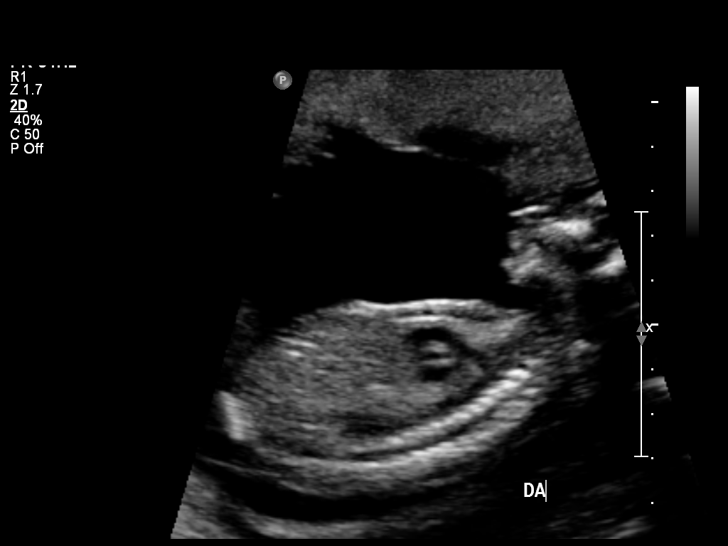
[im 15/43]
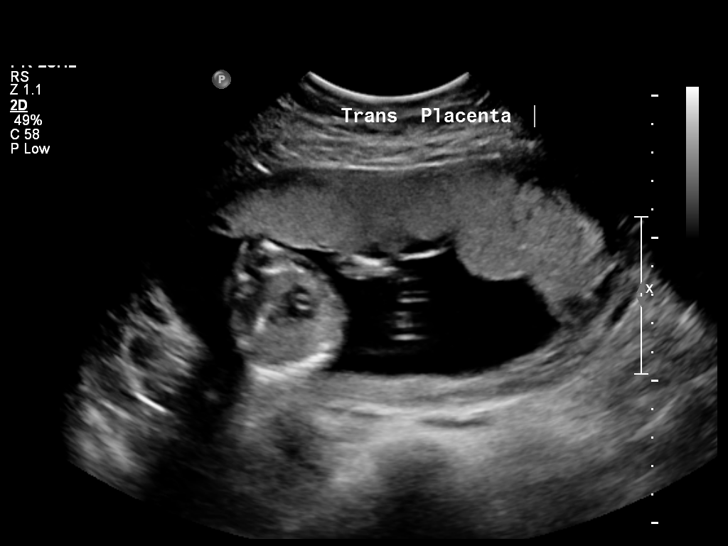
[im 18/43]
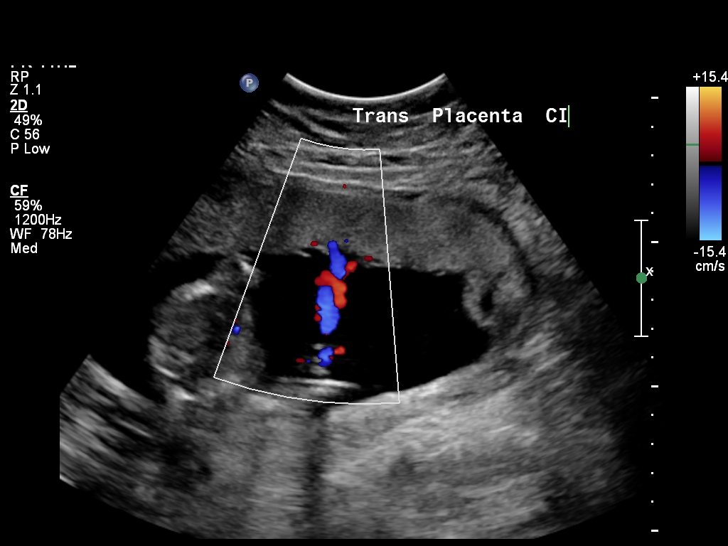
[im 21/43]
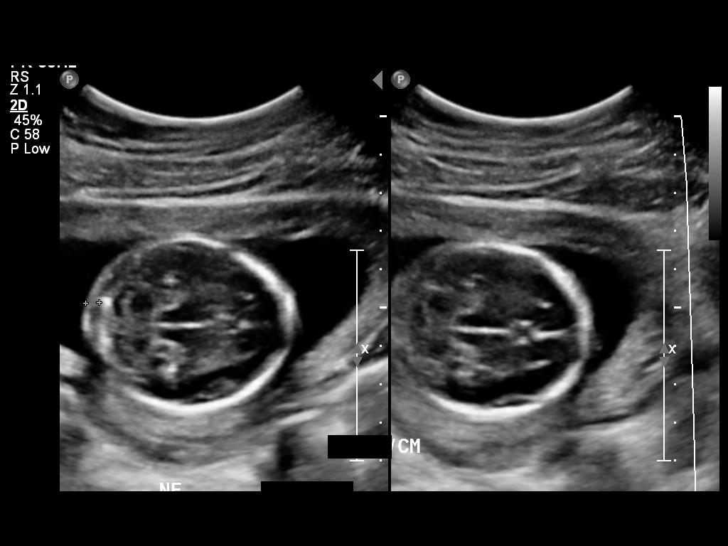
[im 24/43]
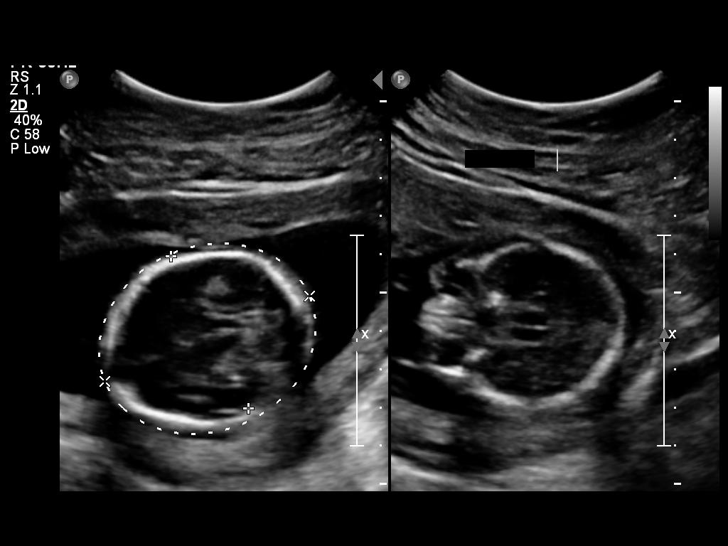
[im 27/43]
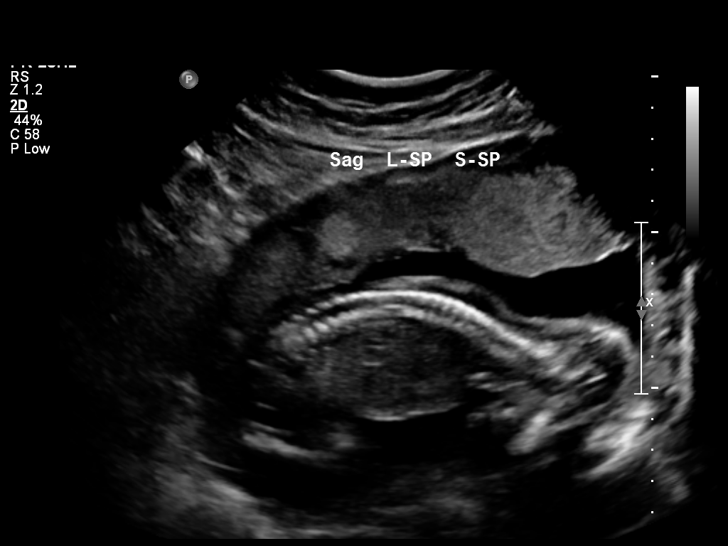
[im 30/43]
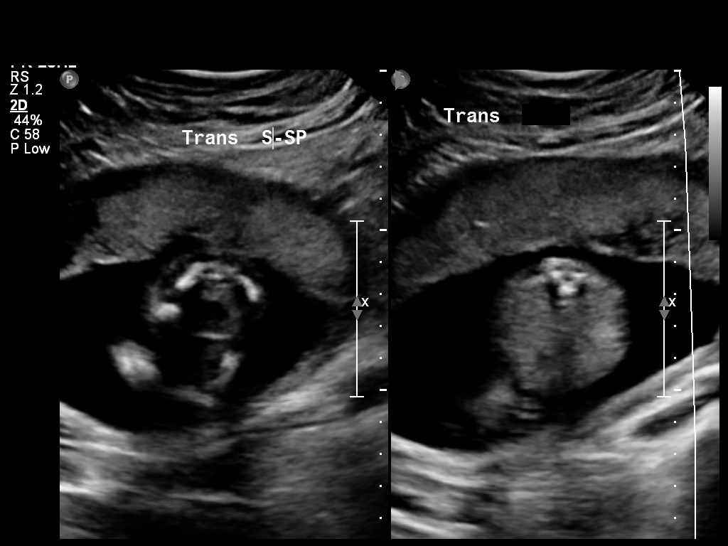
[im 33/43]
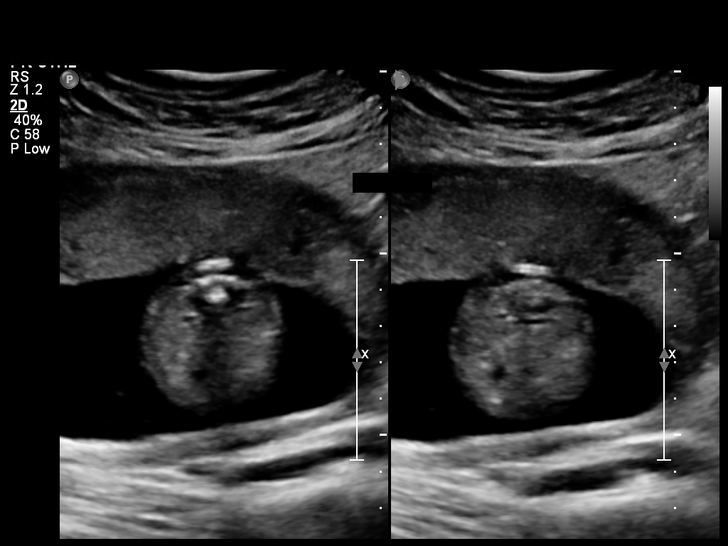
[im 36/43]
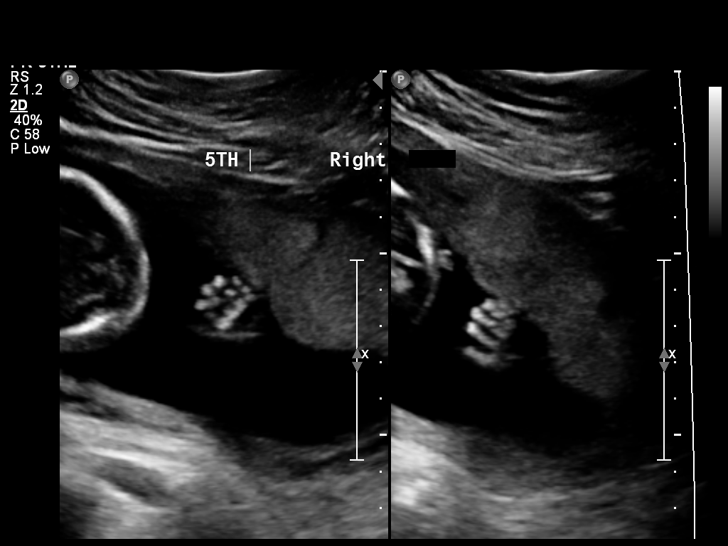
[im 39/43]
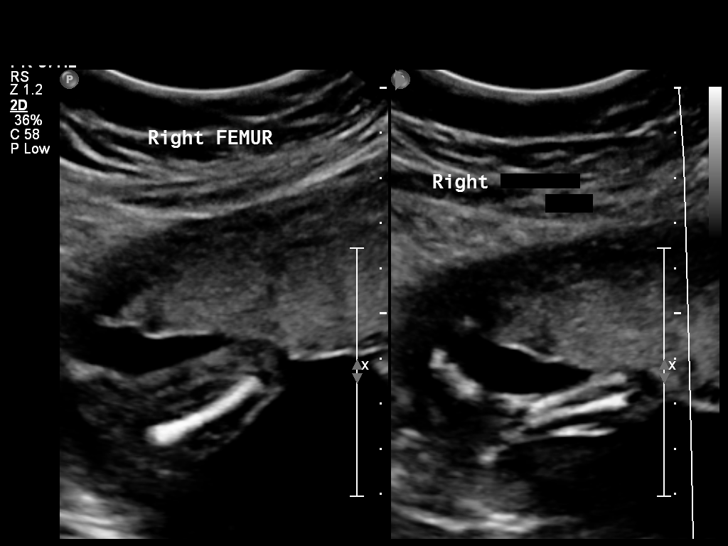
[im 43/43]
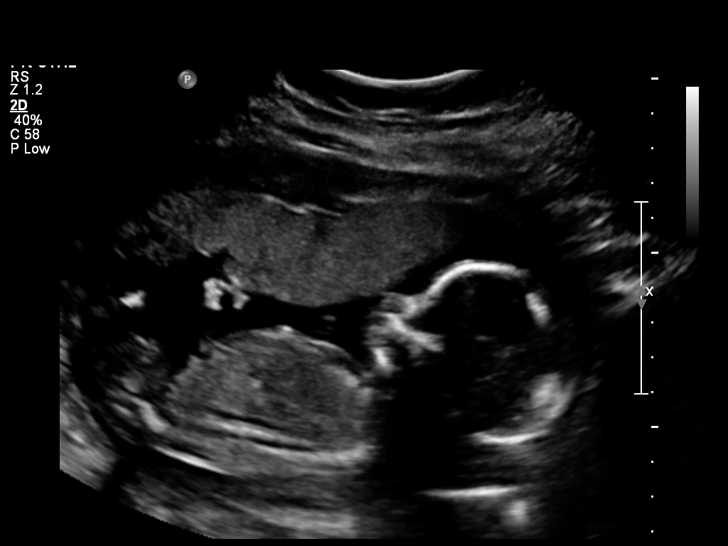

[14 of 28 positions shown; findings below may reference images not displayed]

IMPRESSION: See AS Obstetric US report.

## 2010-11-06 ENCOUNTER — Encounter: Payer: Self-pay | Admitting: Family Medicine

## 2010-11-06 ENCOUNTER — Ambulatory Visit (HOSPITAL_COMMUNITY)
Admission: RE | Admit: 2010-11-06 | Discharge: 2010-11-06 | Payer: Self-pay | Source: Home / Self Care | Admitting: Family Medicine

## 2010-11-06 IMAGING — US US OB FOLLOW-UP
1 series · 14 of 28 positions shown · non-contrast
Comparison: none

OBSTETRICAL ULTRASOUND:
 This ultrasound exam was performed in the [HOSPITAL] Ultrasound Department.  The OB US report was generated in the AS system, and faxed to the ordering physician.  This report is also available in [HOSPITAL]?s [employer] and in [REDACTED] PACS.

[Series 1: us ob follow up · 0.21mm/px · 14 of 33 slices shown]
[im 2/33]
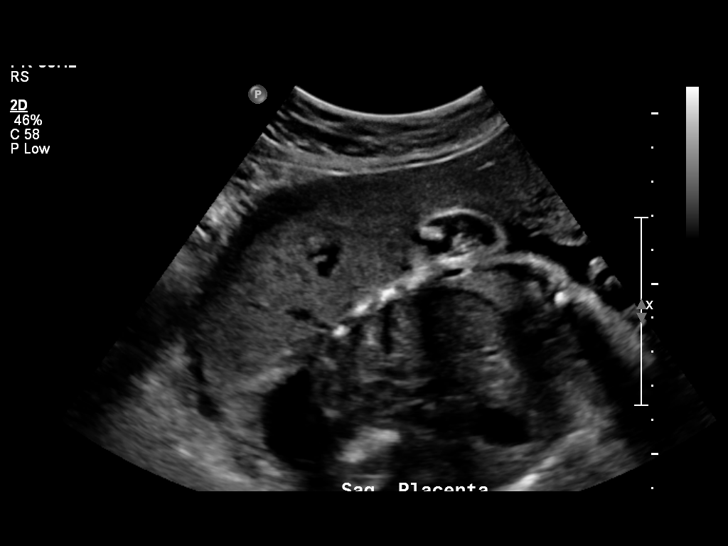
[im 4/33]
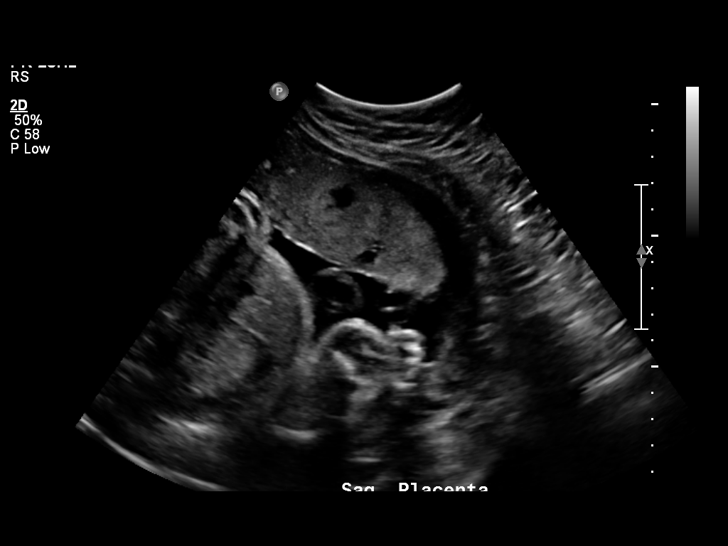
[im 6/33]
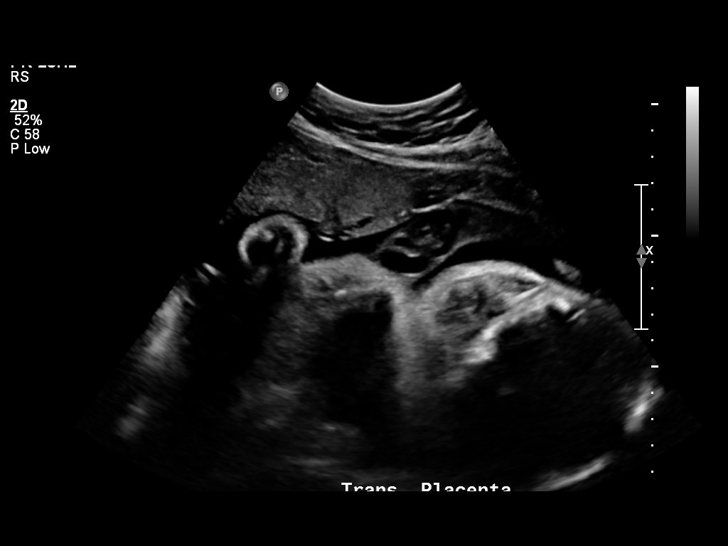
[im 9/33]
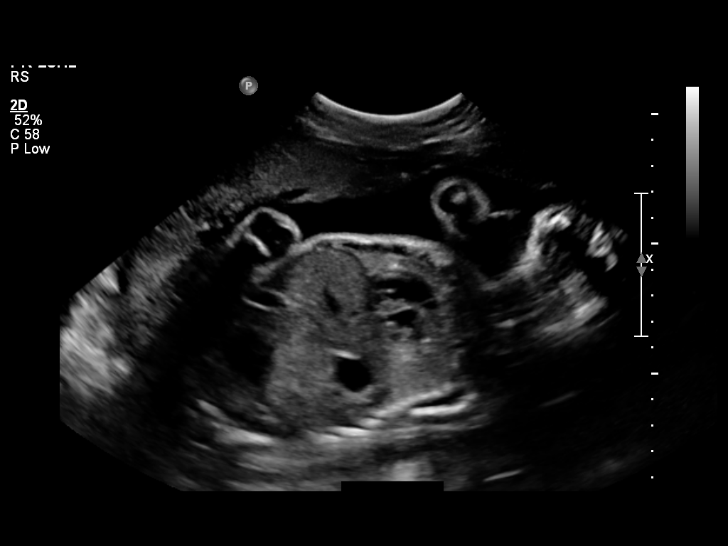
[im 11/33]
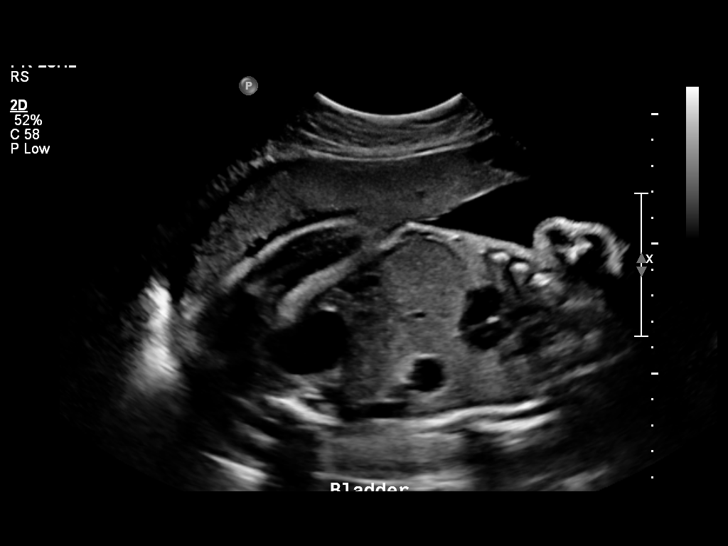
[im 14/33]
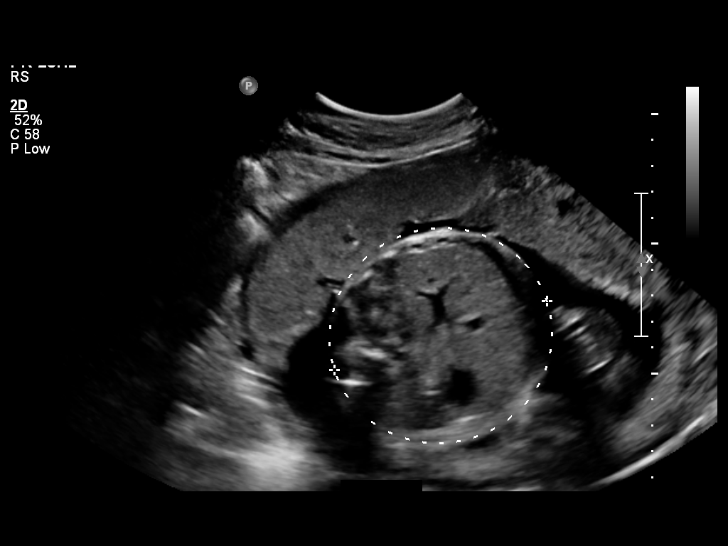
[im 16/33]
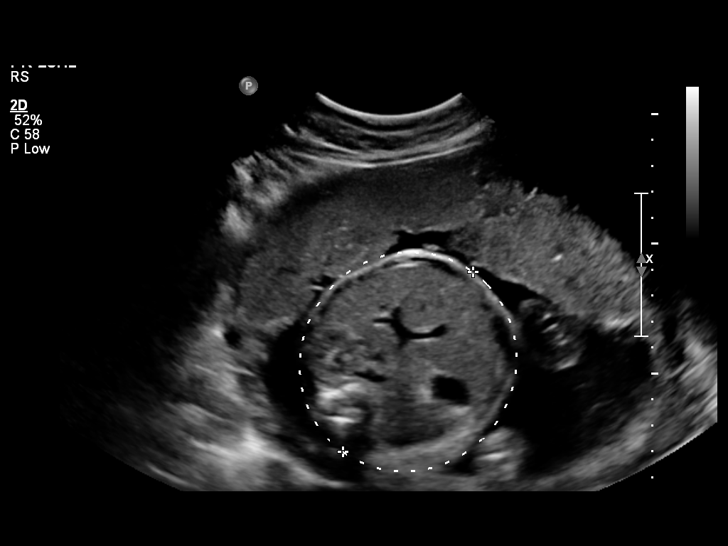
[im 18/33]
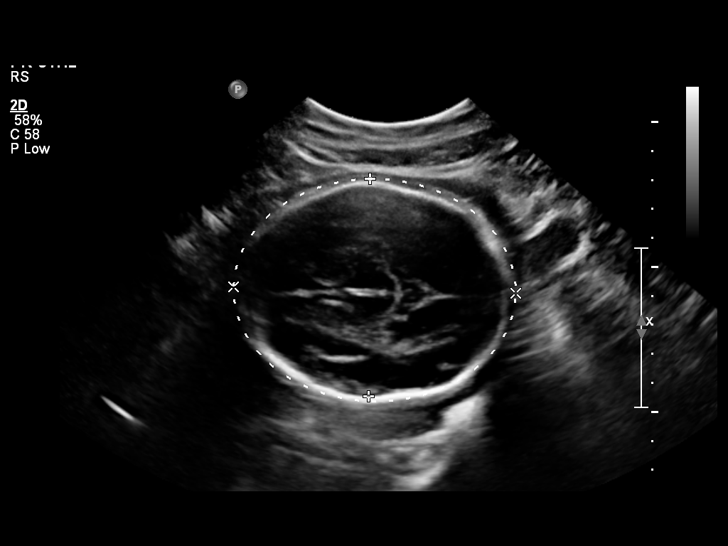
[im 21/33]
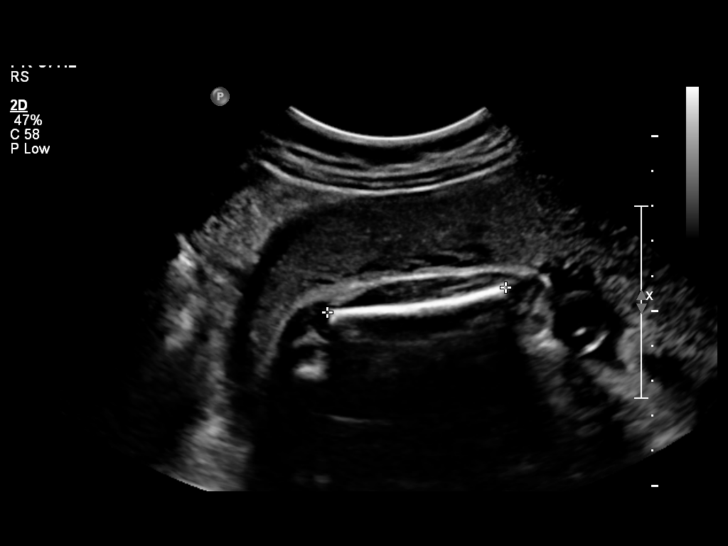
[im 23/33]
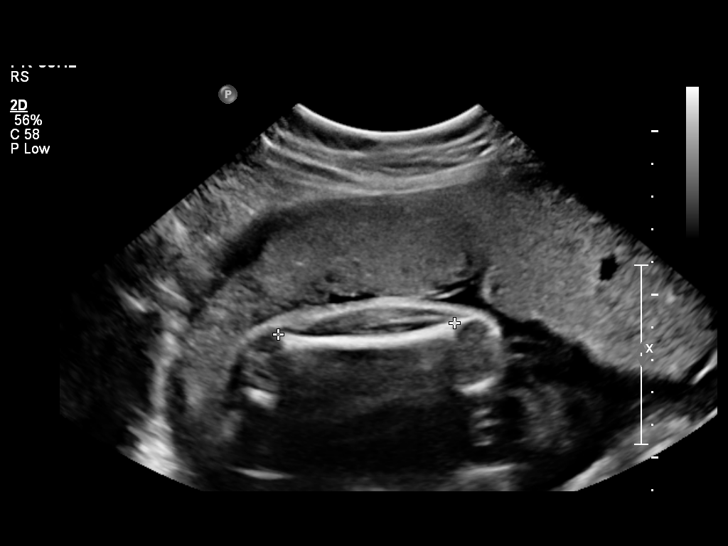
[im 25/33]
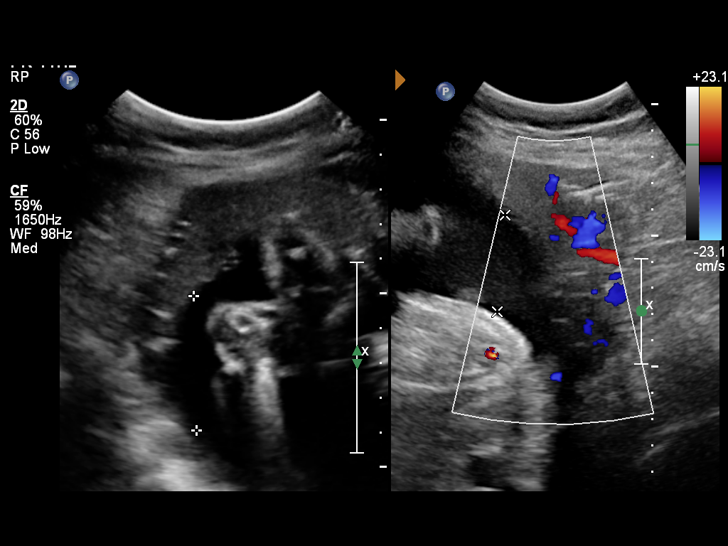
[im 28/33]
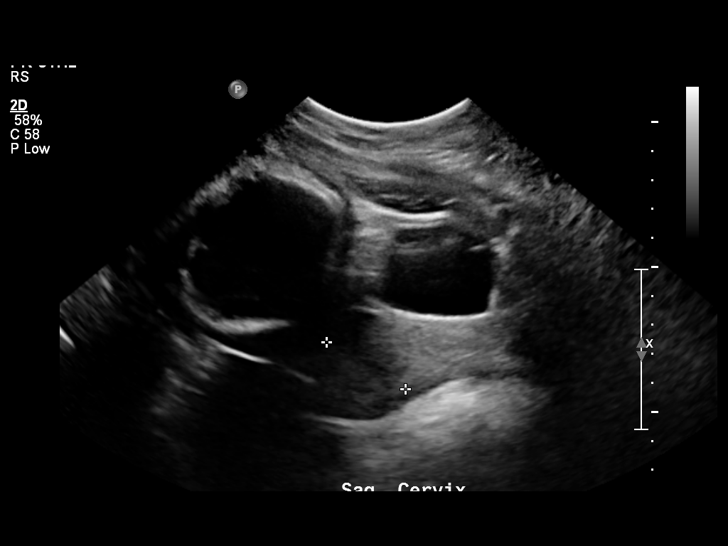
[im 30/33]
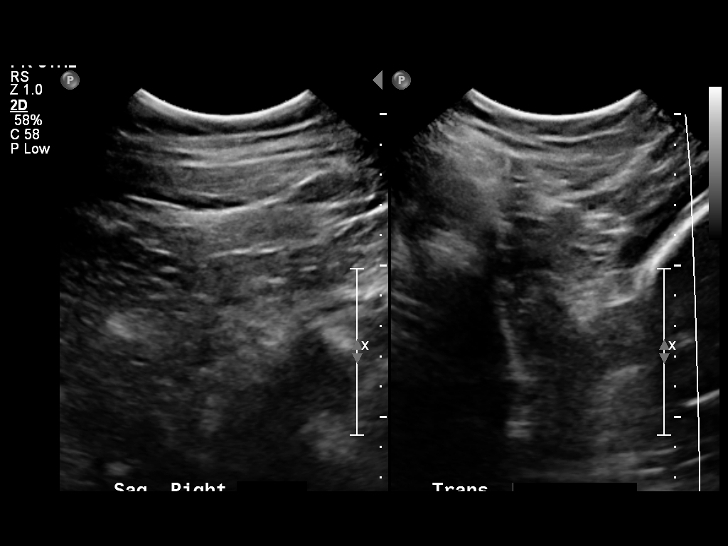
[im 33/33]
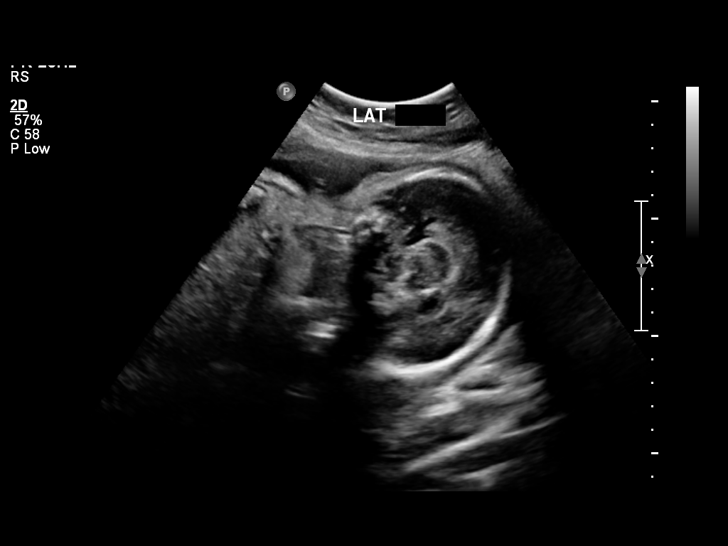

[14 of 28 positions shown; findings below may reference images not displayed]

IMPRESSION: See AS Obstetric US report.

## 2011-01-14 ENCOUNTER — Inpatient Hospital Stay (HOSPITAL_COMMUNITY)
Admission: AD | Admit: 2011-01-14 | Discharge: 2011-01-16 | Payer: Self-pay | Source: Home / Self Care | Attending: Obstetrics & Gynecology | Admitting: Obstetrics & Gynecology

## 2011-01-14 ENCOUNTER — Inpatient Hospital Stay (HOSPITAL_COMMUNITY)
Admission: AD | Admit: 2011-01-14 | Discharge: 2011-01-14 | Payer: Self-pay | Source: Home / Self Care | Attending: Family Medicine | Admitting: Family Medicine

## 2011-01-15 LAB — CBC
HCT: 38.1 % (ref 36.0–46.0)
Hemoglobin: 12.8 g/dL (ref 12.0–15.0)
MCH: 27.6 pg (ref 26.0–34.0)
MCHC: 33.6 g/dL (ref 30.0–36.0)
MCV: 82.3 fL (ref 78.0–100.0)
Platelets: 193 10*3/uL (ref 150–400)
RBC: 4.63 MIL/uL (ref 3.87–5.11)
RDW: 15.2 % (ref 11.5–15.5)
WBC: 6.7 10*3/uL (ref 4.0–10.5)

## 2011-01-15 LAB — RPR: RPR Ser Ql: NONREACTIVE

## 2011-01-19 ENCOUNTER — Encounter: Payer: Self-pay | Admitting: Family Medicine

## 2011-03-16 LAB — POCT PREGNANCY, URINE: Preg Test, Ur: POSITIVE

## 2011-04-02 LAB — DIFFERENTIAL
Basophils Absolute: 0 10*3/uL (ref 0.0–0.1)
Basophils Relative: 1 % (ref 0–1)
Eosinophils Absolute: 0.2 10*3/uL (ref 0.0–0.7)
Eosinophils Relative: 3 % (ref 0–5)
Lymphocytes Relative: 29 % (ref 12–46)
Lymphs Abs: 1.8 10*3/uL (ref 0.7–4.0)
Monocytes Absolute: 0.5 10*3/uL (ref 0.1–1.0)
Monocytes Relative: 8 % (ref 3–12)
Neutro Abs: 3.7 10*3/uL (ref 1.7–7.7)
Neutrophils Relative %: 60 % (ref 43–77)

## 2011-04-02 LAB — POCT I-STAT, CHEM 8
BUN: 13 mg/dL (ref 6–23)
Calcium, Ion: 1.2 mmol/L (ref 1.12–1.32)
Chloride: 104 mEq/L (ref 96–112)
Creatinine, Ser: 1 mg/dL (ref 0.4–1.2)
Glucose, Bld: 101 mg/dL — ABNORMAL HIGH (ref 70–99)
HCT: 42 % (ref 36.0–46.0)
Hemoglobin: 14.3 g/dL (ref 12.0–15.0)
Potassium: 3.9 mEq/L (ref 3.5–5.1)
Sodium: 143 mEq/L (ref 135–145)
TCO2: 29 mmol/L (ref 0–100)

## 2011-04-02 LAB — CBC
HCT: 39.6 % (ref 36.0–46.0)
Hemoglobin: 13.6 g/dL (ref 12.0–15.0)
MCHC: 34.3 g/dL (ref 30.0–36.0)
MCV: 85.2 fL (ref 78.0–100.0)
Platelets: 282 10*3/uL (ref 150–400)
RBC: 4.65 MIL/uL (ref 3.87–5.11)
RDW: 13.4 % (ref 11.5–15.5)
WBC: 6.2 10*3/uL (ref 4.0–10.5)

## 2011-04-02 LAB — POCT URINALYSIS DIP (DEVICE)
Bilirubin Urine: NEGATIVE
Glucose, UA: NEGATIVE mg/dL
Hgb urine dipstick: NEGATIVE
Ketones, ur: 15 mg/dL — AB
Nitrite: NEGATIVE
Protein, ur: NEGATIVE mg/dL
Specific Gravity, Urine: 1.02 (ref 1.005–1.030)
Urobilinogen, UA: 0.2 mg/dL (ref 0.0–1.0)
pH: 7 (ref 5.0–8.0)

## 2011-04-02 LAB — POCT PREGNANCY, URINE: Preg Test, Ur: NEGATIVE

## 2011-09-18 LAB — CBC
HCT: 33.8 — ABNORMAL LOW
HCT: 37
Hemoglobin: 11.6 — ABNORMAL LOW
Hemoglobin: 12.8
MCHC: 34.2
MCHC: 34.7
MCV: 84.2
MCV: 85.9
Platelets: 176
Platelets: 203
RBC: 3.93
RBC: 4.4
RDW: 16 — ABNORMAL HIGH
RDW: 16.4 — ABNORMAL HIGH
WBC: 12.1 — ABNORMAL HIGH
WBC: 7.7

## 2011-09-18 LAB — RPR: RPR Ser Ql: NONREACTIVE

## 2011-09-18 LAB — RAPID HIV SCREEN (WH-MAU): Rapid HIV Screen: NONREACTIVE

## 2011-10-16 LAB — URINALYSIS, ROUTINE W REFLEX MICROSCOPIC
Ketones, ur: NEGATIVE
Nitrite: NEGATIVE
Urobilinogen, UA: 0.2
pH: 6.5

## 2011-10-16 LAB — GC/CHLAMYDIA PROBE AMP, GENITAL: GC Probe Amp, Genital: NEGATIVE

## 2011-10-16 LAB — WET PREP, GENITAL
Clue Cells Wet Prep HPF POC: NONE SEEN
Trich, Wet Prep: NONE SEEN

## 2011-10-16 LAB — CBC
Hemoglobin: 12.2
MCHC: 34.4
RBC: 4.33
WBC: 7.6

## 2011-10-16 LAB — HCG, QUANTITATIVE, PREGNANCY: hCG, Beta Chain, Quant, S: 11859 — ABNORMAL HIGH

## 2012-12-13 LAB — OB RESULTS CONSOLE RPR: RPR: NONREACTIVE

## 2012-12-13 LAB — OB RESULTS CONSOLE HIV ANTIBODY (ROUTINE TESTING): HIV: NONREACTIVE

## 2012-12-15 ENCOUNTER — Other Ambulatory Visit (HOSPITAL_COMMUNITY): Payer: Self-pay | Admitting: Physician Assistant

## 2012-12-15 DIAGNOSIS — Z3689 Encounter for other specified antenatal screening: Secondary | ICD-10-CM

## 2012-12-15 LAB — OB RESULTS CONSOLE GC/CHLAMYDIA
Chlamydia: NEGATIVE
Gonorrhea: NEGATIVE

## 2012-12-23 ENCOUNTER — Ambulatory Visit (HOSPITAL_COMMUNITY)
Admission: RE | Admit: 2012-12-23 | Discharge: 2012-12-23 | Disposition: A | Discharge status: Home / Self Care | Payer: Medicaid Other | Source: Ambulatory Visit | Attending: Physician Assistant | Admitting: Physician Assistant

## 2012-12-23 DIAGNOSIS — O9989 Other specified diseases and conditions complicating pregnancy, childbirth and the puerperium: Secondary | ICD-10-CM | POA: Insufficient documentation

## 2012-12-23 DIAGNOSIS — Z3689 Encounter for other specified antenatal screening: Secondary | ICD-10-CM

## 2012-12-23 DIAGNOSIS — O358XX0 Maternal care for other (suspected) fetal abnormality and damage, not applicable or unspecified: Secondary | ICD-10-CM | POA: Insufficient documentation

## 2012-12-23 DIAGNOSIS — Z1389 Encounter for screening for other disorder: Secondary | ICD-10-CM | POA: Insufficient documentation

## 2012-12-23 DIAGNOSIS — Z363 Encounter for antenatal screening for malformations: Secondary | ICD-10-CM | POA: Insufficient documentation

## 2012-12-23 IMAGING — US US OB DETAIL+14 WK
2 series · 12 of 28 positions shown · non-contrast
Comparison: none

[Series 1: us ob detail +14 wk · 6 acquisitions, 1 frame shown (1 of 2)]
[im 6/6]
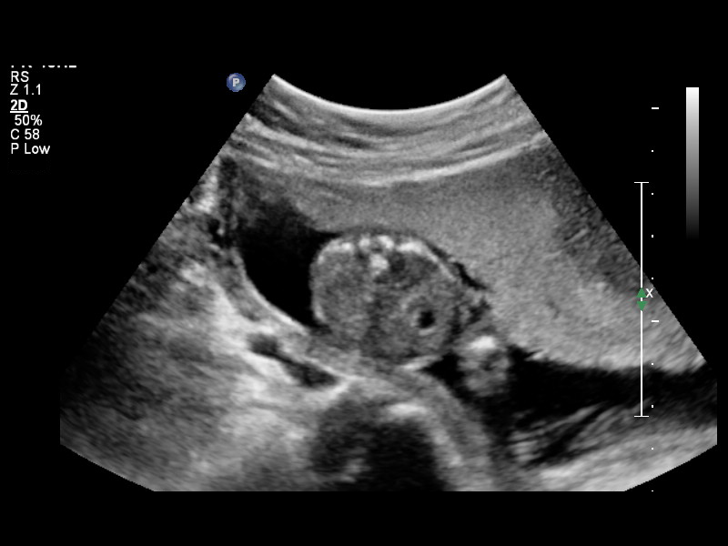

[Series 1: us ob detail +14 wk · 79 acquisitions, 11 frames shown (2 of 2)]
[im 4/79]
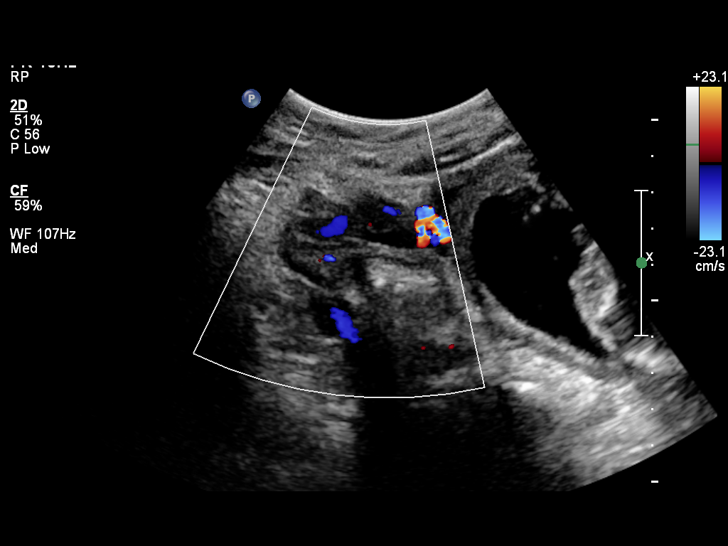
[im 10/79]
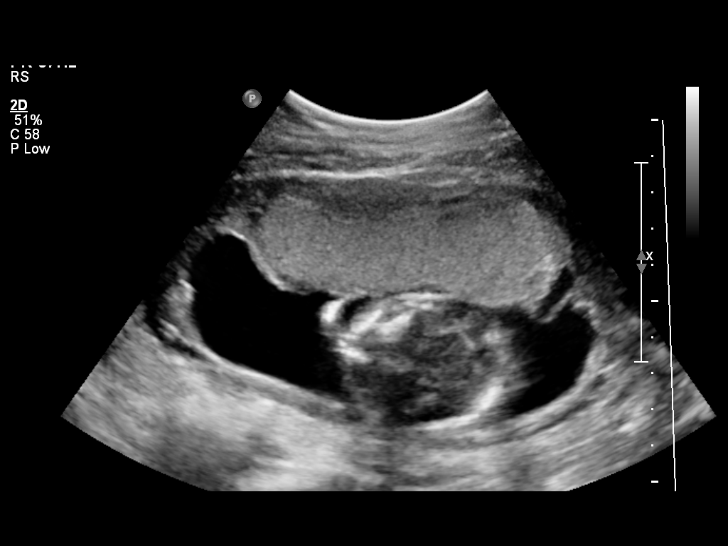
[im 19/79]
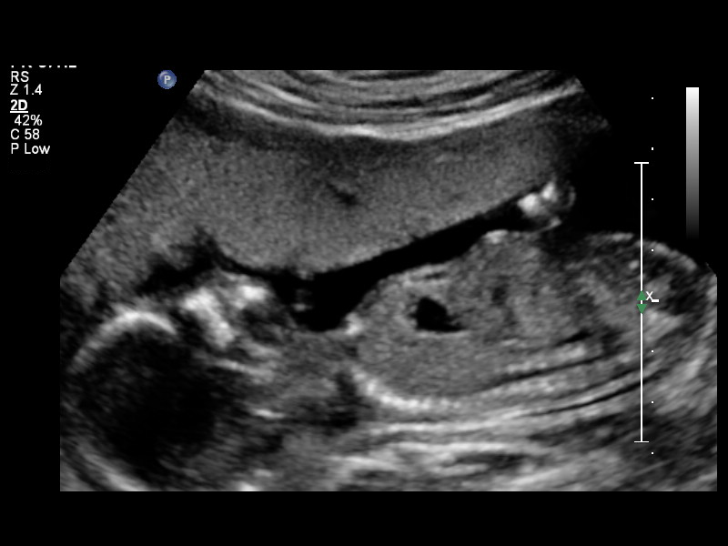
[im 25/79]
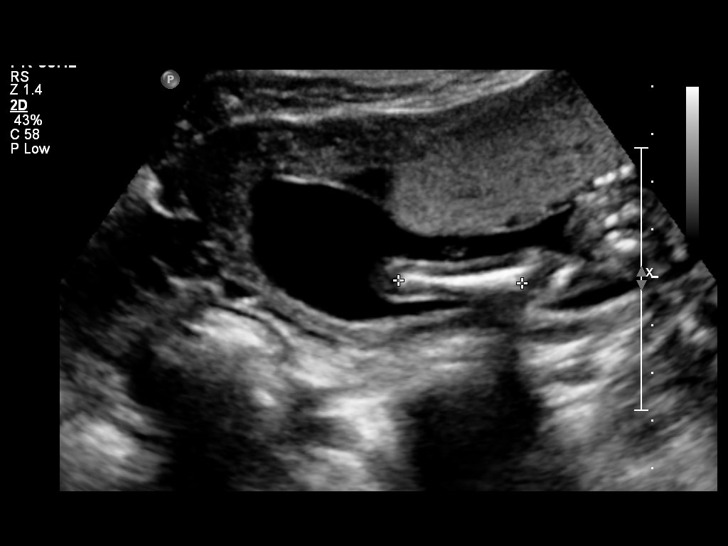
[im 32/79]
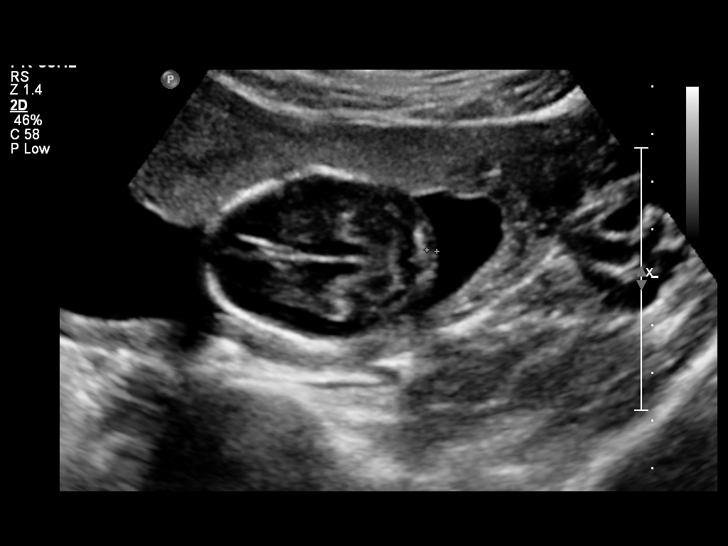
[im 41/79]
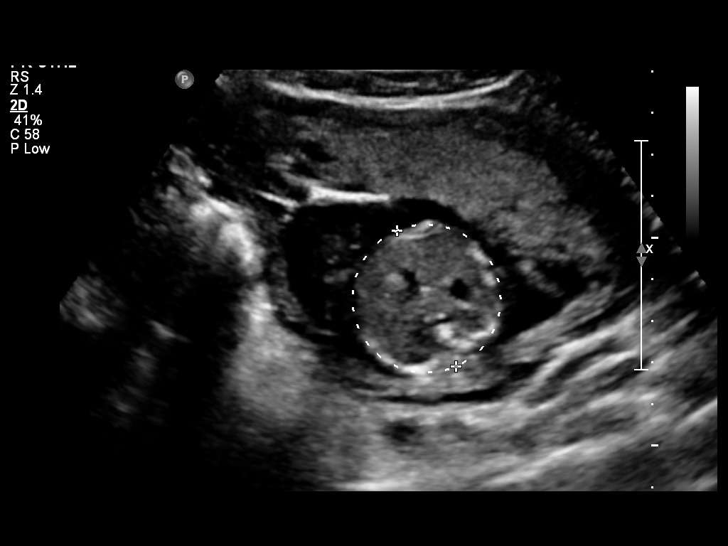
[im 47/79]
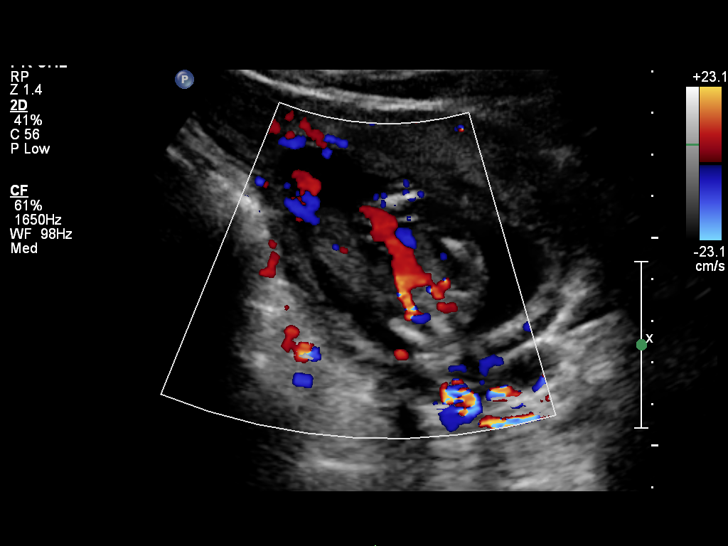
[im 54/79]
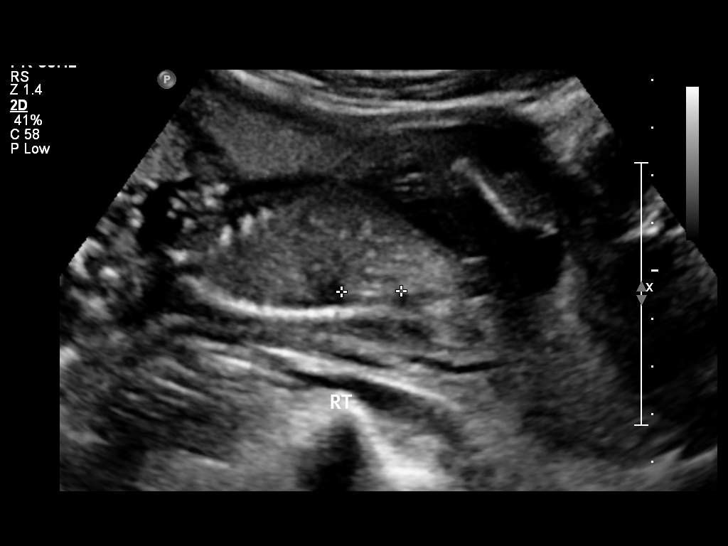
[im 63/79]
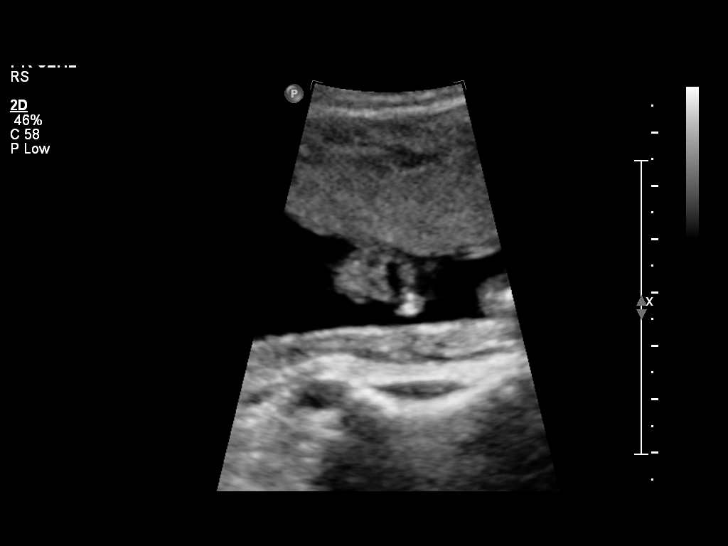
[im 69/79]
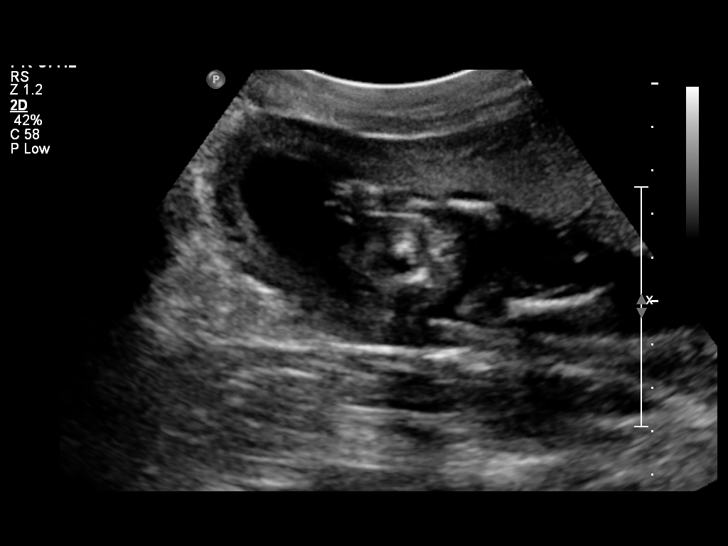
[im 75/79]
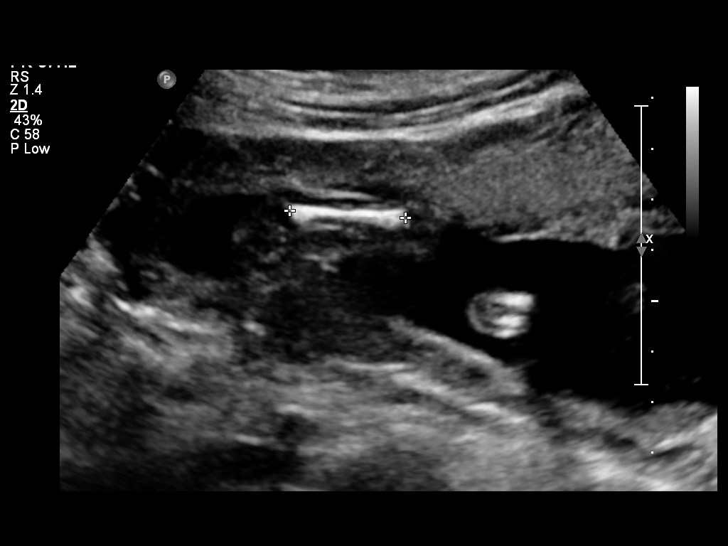

[12 of 28 positions shown; findings below may reference images not displayed]

OBSTETRICS REPORT
                      (Signed Final 12/23/2012 [DATE])

             RTOYOTA

Service(s) Provided

 US OB DETAIL + 14 WK                                  76811.0
Indications

 Detailed fetal anatomic survey
 Uncertain LMP;  Establish Gestational [AGE]
Fetal Evaluation

 Num Of Fetuses:    1
 Fetal Heart Rate:  144                         bpm
 Cardiac Activity:  Observed
 Presentation:      Breech
 Placenta:          Anterior, above cervical os
 P. Cord            Visualized, central
 Insertion:

 Amniotic Fluid
 AFI FV:      Subjectively within normal limits
                                             Larg Pckt:     4.5  cm
Biometry

 BPD:     35.7  mm    G. Age:   17w 0d                CI:        68.81   70 - 86
                                                      FL/HC:      16.7   14.6 -

 HC:     137.5  mm    G. Age:   17w 1d       49  %    HC/AC:      1.25   1.07 -

 AC:     109.9  mm    G. Age:   16w 6d       45  %    FL/BPD:
 FL:      22.9  mm    G. Age:   16w 6d       40  %    FL/AC:      20.8   20 - 24
 HUM:     25.8  mm    G. Age:   18w 0d       84  %
 NFT:      2.1  mm

 Est. FW:     173  gm      0 lb 6 oz     54  %
Gestational Age

 LMP:           19w 3d       Date:   08/09/12                 EDD:   05/16/13
 U/S Today:     17w 0d                                        EDD:   06/02/13
 Best:          17w 0d    Det. By:   U/S (12/23/12)           EDD:   06/02/13
2nd Trimester Genetic Sonogram - Trisomy 21 Screening
 Age:                                             32          Risk=1:   641

 Structural anomalies (inc. cardiac):             No
 Echogenic bowel:                                 No
 Hypoplastic / absent midphalanx 5th Digit:       No
 Wide space 3st-3nd toes:                         No
 Pyelectasis:                                     No
 2-vessel umbilical cord:                         No
 Echogenic cardiac foci:                          No
Anatomy

 Cranium:          Appears normal         Aortic Arch:      Appears normal
 Fetal Cavum:      Appears normal         Ductal Arch:      Appears normal
 Ventricles:       Appears normal         Diaphragm:        Appears normal
 Choroid Plexus:   Appears normal         Stomach:          Appears normal, left
                                                            sided
 Cerebellum:       Appears normal         Abdomen:          Appears normal
 Posterior Fossa:  Appears normal         Abdominal Wall:   Appears nml (cord
                                                            insert, abd wall)
 Nuchal Fold:      Appears normal         Cord Vessels:     Appears normal (3
                                                            vessel cord)
 Face:             Profile appears        Kidneys:          Appear normal
                   normal
 Lips:             Appears normal         Bladder:          Appears normal
 Heart:            Appears normal         Spine:            Appears normal
                   (4CH, axis, and
                   situs)
 RVOT:             Appears normal         Lower             Appears normal
                                          Extremities:
 LVOT:             Appears normal         Upper             Appears normal
                                          Extremities:

 Other:  Fetus appears to be a female. Heels and 5th digit visualized.
Cervix Uterus Adnexa

 Cervical Length:   3.3       cm

 Cervix:       Normal appearance by transabdominal scan.
 Uterus:       No abnormality visualized.

 Left Ovary:   No adnexal mass visualized.
 Right Ovary:  No adnexal mass visualized.
 Adnexa:     No abnormality visualized.
Impression

 Single living IUP with US Gest. Age of 17w 0d, and EDD of
 06/02/2013.
 No fetal anatomic abnormality is identified.
 Normal amniotic fluid volume and cervical length.

 RTOY[facility] with us.  Please do not hesitate to

## 2012-12-29 NOTE — L&D Delivery Note (Signed)
Attestation of Attending Supervision of Advanced Practitioner: Evaluation and management procedures were performed by the PA/NP/CNM/OB Fellow under my supervision/collaboration. Chart reviewed and agree with management and plan.  Amparo Donalson V 06/03/2013 7:30 AM

## 2012-12-29 NOTE — L&D Delivery Note (Signed)
Delivery Note At 8:58 AM a viable female was delivered via Vaginal, Spontaneous Delivery (Presentation: ROA ).  APGAR: 9/9 ; weight: not available at time of note  .   Placenta status: delivered spontaneously intact via [place] w/ maternal pushing efforts. App 7cmx2.5cm marginal infarction.  Cord: 3 vessels with the following complications: None.  Cord pH: not done  Anesthesia: Other  Episiotomy: n/a Lacerations: none Suture Repair: n/a Est. Blood Loss (mL):  Mom to postpartum.  Baby to nursery-stable.  Plans to breast/bottlefeed, mirena for contraception  Leslie Hess 05/28/2013, 9:17 AM

## 2013-04-27 ENCOUNTER — Other Ambulatory Visit (HOSPITAL_COMMUNITY): Payer: Self-pay | Admitting: Physician Assistant

## 2013-04-27 DIAGNOSIS — O26843 Uterine size-date discrepancy, third trimester: Secondary | ICD-10-CM

## 2013-04-28 ENCOUNTER — Encounter (HOSPITAL_COMMUNITY): Payer: Self-pay

## 2013-04-28 ENCOUNTER — Ambulatory Visit (HOSPITAL_COMMUNITY)
Admission: RE | Admit: 2013-04-28 | Discharge: 2013-04-28 | Disposition: A | Discharge status: Home / Self Care | Payer: Self-pay | Source: Ambulatory Visit | Attending: Physician Assistant | Admitting: Physician Assistant

## 2013-04-28 DIAGNOSIS — O26843 Uterine size-date discrepancy, third trimester: Secondary | ICD-10-CM

## 2013-04-28 DIAGNOSIS — O365990 Maternal care for other known or suspected poor fetal growth, unspecified trimester, not applicable or unspecified: Secondary | ICD-10-CM | POA: Insufficient documentation

## 2013-04-28 DIAGNOSIS — Z3689 Encounter for other specified antenatal screening: Secondary | ICD-10-CM | POA: Insufficient documentation

## 2013-04-28 IMAGING — US US OB FOLLOW-UP
1 series · 12 of 28 positions shown · non-contrast
Comparison: none

[Series 1: us ob follow up · 12 of 50 slices shown]
[im 2/50]
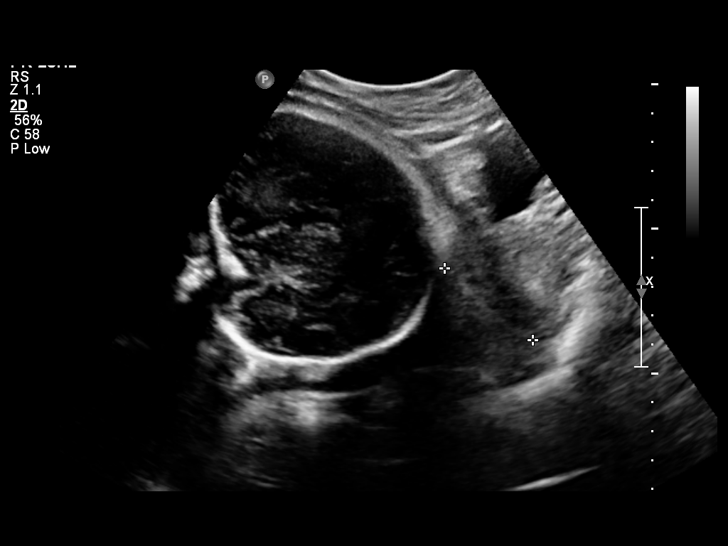
[im 6/50]
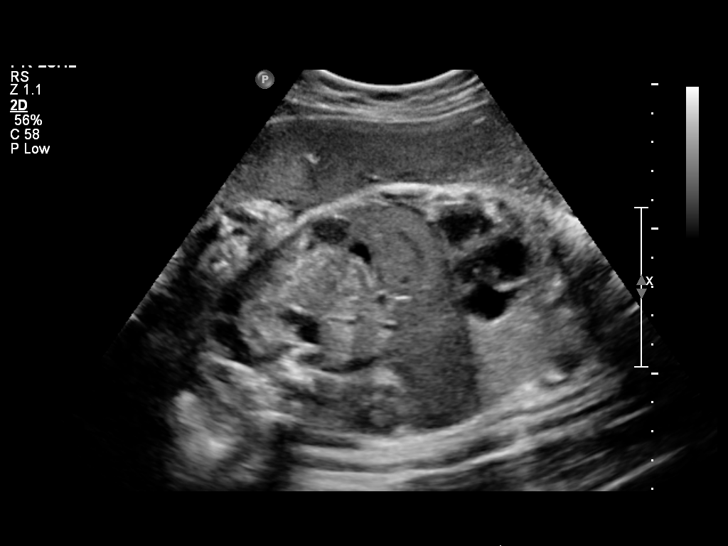
[im 10/50]
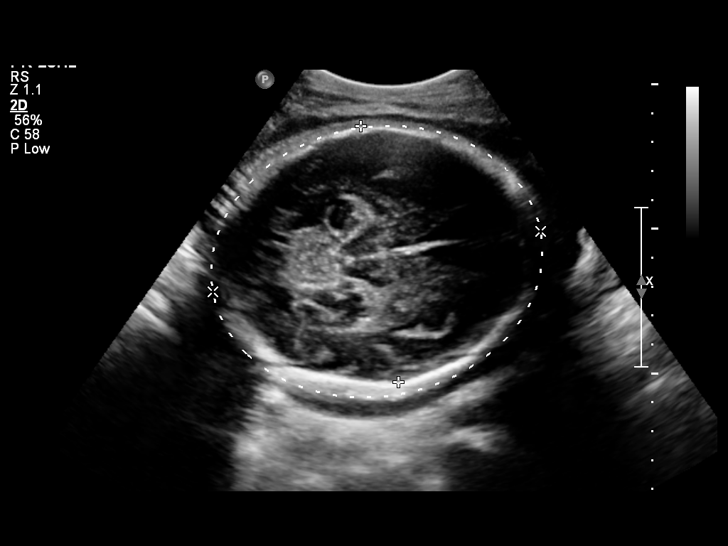
[im 15/50]
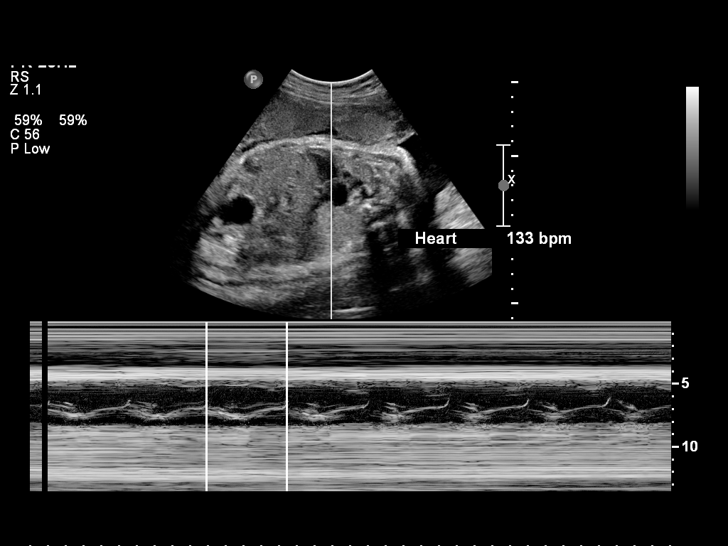
[im 19/50]
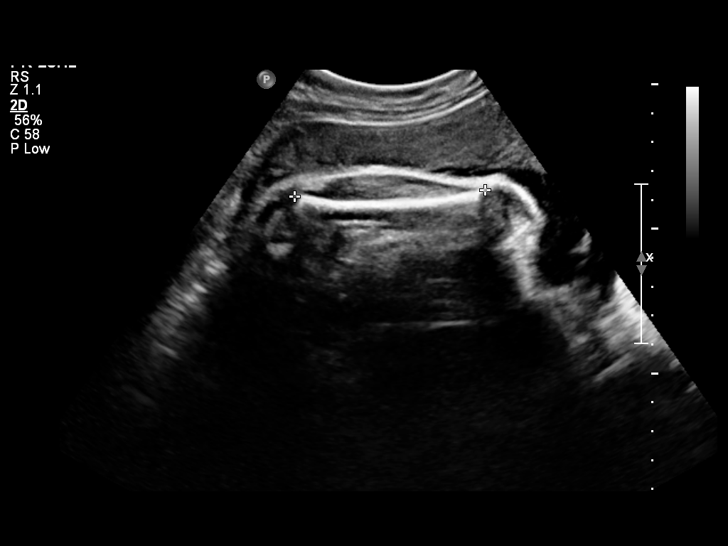
[im 22/50]
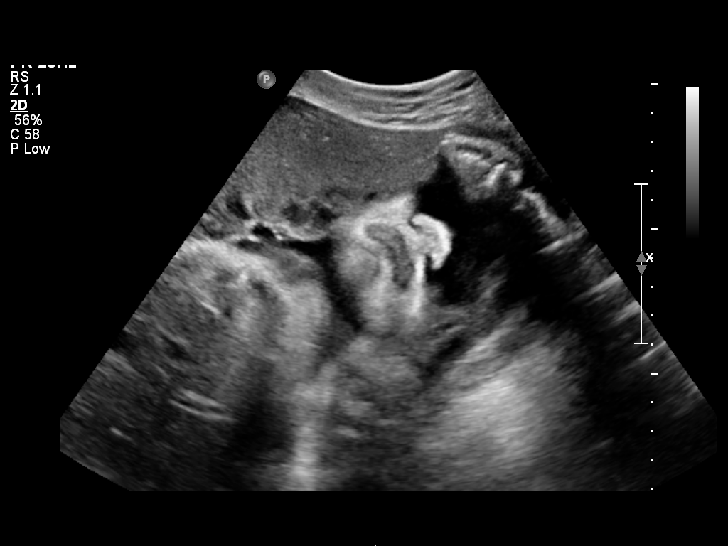
[im 28/50]
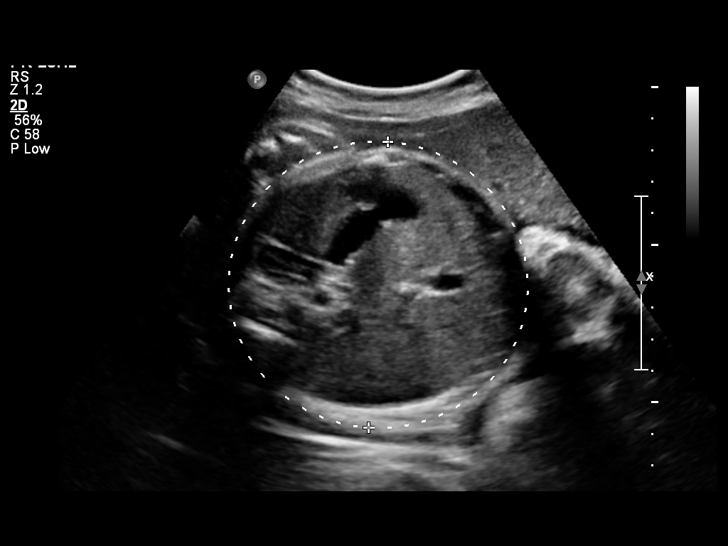
[im 31/50]
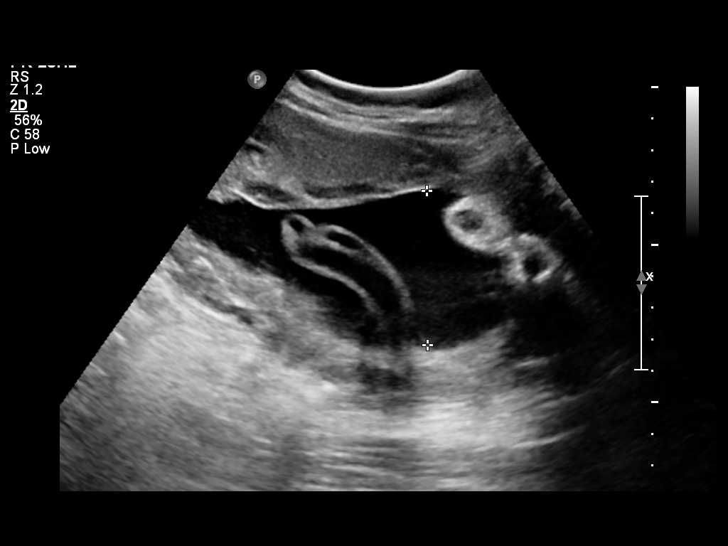
[im 35/50]
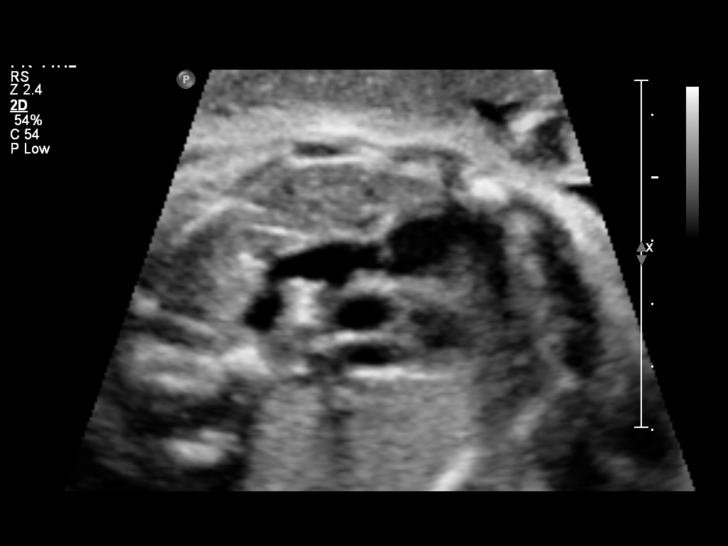
[im 40/50]
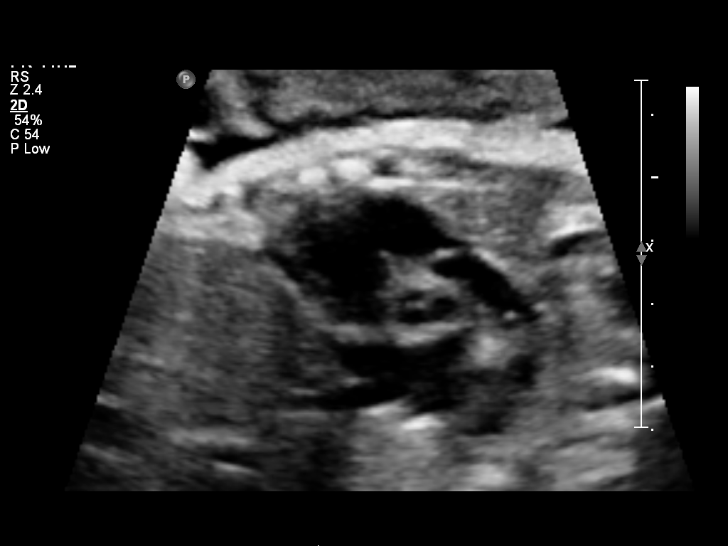
[im 44/50]
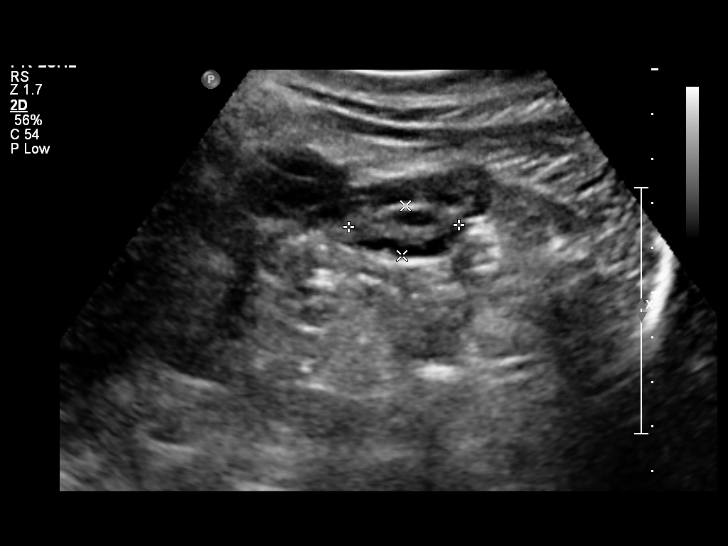
[im 48/50]
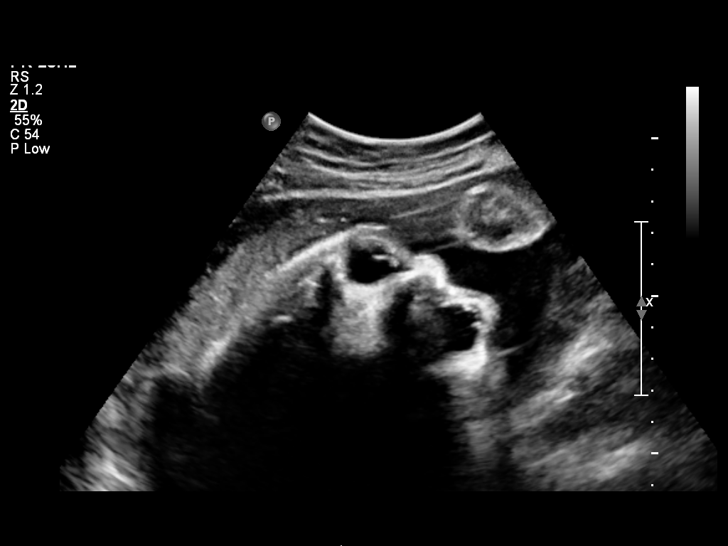

[12 of 28 positions shown; findings below may reference images not displayed]

OBSTETRICS REPORT
                      (Signed Final 04/28/2013 [DATE])

             INOCENTE

Service(s) Provided

 US OB FOLLOW UP                                       76816.1
Indications

 Size less than dates (Small for gestational [AGE]
 FGR)
Fetal Evaluation

 Num Of Fetuses:    1
 Fetal Heart Rate:  133                         bpm
 Cardiac Activity:  Observed
 Presentation:      Cephalic
 Placenta:          Anterior, above cervical os

 Amniotic Fluid
 AFI FV:      Subjectively within normal limits
 AFI Sum:     13.76   cm      48   %Tile     Larg Pckt:   4.88   cm
 RUQ:   1.48   cm    RLQ:    4.41   cm    LUQ:   4.88    cm   LLQ:    2.99   cm
Biometry

 BPD:     89.3  mm    G. Age:   36w 1d                CI:        75.87   70 - 86
                                                      FL/HC:      20.3   20.1 -

 HC:       325  mm    G. Age:   36w 6d       61  %    HC/AC:      1.11   0.93 -

 AC:     293.4  mm    G. Age:   33w 2d       14  %    FL/BPD:     73.8   71 - 87
 FL:      65.9  mm    G. Age:   34w 0d       18  %    FL/AC:      22.5   20 - 24
 HUM:     56.5  mm    G. Age:   32w 6d       22  %
 CER:     47.7  mm    G. Age:   N/A        > 95  %

 Est. FW:    2117  gm      5 lb 3 oz     44  %
Gestational Age

 LMP:           37w 3d       Date:   08/09/12                 EDD:   05/16/13
 U/S Today:     35w 1d                                        EDD:   06/01/13
 Best:          35w 0d    Det. By:   U/S (12/23/12)           EDD:   06/02/13
Anatomy
 Cranium:          Appears normal         Aortic Arch:      Previously seen
 Fetal Cavum:      Appears normal         Ductal Arch:      Previously seen
 Ventricles:       Appears normal         Diaphragm:        Appears normal
 Choroid Plexus:   Previously seen        Stomach:          Appears normal, left
                                                            sided
 Cerebellum:       Appears normal         Abdomen:          Appears normal
 Posterior Fossa:  Appears normal         Abdominal Wall:   Previously seen
 Nuchal Fold:      Previously seen        Cord Vessels:     Appears normal (3
                                                            vessel cord)
 Face:             Appears normal         Kidneys:          Appear normal
                   (orbits and profile)
 Lips:             Appears normal         Bladder:          Appears normal
 Heart:            Appears normal         Spine:            Previously seen
                   (4CH, axis, and
                   situs)
 RVOT:             Appears normal         Lower             Previously seen
                                          Extremities:
 LVOT:             Appears normal         Upper             Previously seen
                                          Extremities:

 Other:  Fetus appears to be a female. Heels and 5th digit visualized.
Cervix Uterus Adnexa

 Cervical Length:   3.9       cm

 Cervix:       Normal appearance by transabdominal scan.

 Left Ovary:   Within normal limits.
 Right Ovary:  Within normal limits.
Impression

 Single intrauterine gestation demonstrating an estimated
 gestational age by ultrasound of 35w 1d. This is correlated
 with expected estimated gestational age by prior ultrasound
 of 35w 0d. EFW is currently at the 44%.

 No late developing fetal anatomic abnormalities are noted
 associated with the lateral ventricles, four chamber heart,
 stomach, kidneys or bladder.

 Subjectively and quantitatively normal amniotic fluid volume.

 Normal cervical length and appearance.

 [facility] with us.  Please do not hesitate to

## 2013-05-12 LAB — OB RESULTS CONSOLE GBS: GBS: NEGATIVE

## 2013-05-27 ENCOUNTER — Inpatient Hospital Stay (HOSPITAL_COMMUNITY)
Admission: AD | Admit: 2013-05-27 | Discharge: 2013-05-27 | Disposition: A | Discharge status: Home / Self Care | Payer: Self-pay | Source: Ambulatory Visit | Attending: Obstetrics & Gynecology | Admitting: Obstetrics & Gynecology

## 2013-05-27 ENCOUNTER — Encounter (HOSPITAL_COMMUNITY): Payer: Self-pay

## 2013-05-27 DIAGNOSIS — N9489 Other specified conditions associated with female genital organs and menstrual cycle: Secondary | ICD-10-CM

## 2013-05-27 DIAGNOSIS — IMO0002 Reserved for concepts with insufficient information to code with codable children: Secondary | ICD-10-CM

## 2013-05-27 DIAGNOSIS — O479 False labor, unspecified: Secondary | ICD-10-CM | POA: Insufficient documentation

## 2013-05-27 HISTORY — DX: Other specified health status: Z78.9

## 2013-05-27 NOTE — Progress Notes (Signed)
I assisted Dr Adriana Simas with explanation of discharge

## 2013-05-27 NOTE — Progress Notes (Signed)
I assisted Leslie Hess with questions.

## 2013-05-27 NOTE — MAU Provider Note (Signed)
  History     CSN: [id number]  Arrival date and time: 05/27/13 7829   First Provider Initiated Contact with Patient 05/27/13 1052      Chief Complaint  Patient presents with  . Labor Eval   HPI Leslie Hess is a 32 y.o. [id number] at [redacted]w[redacted]d who presents with reports of bloody, mucous discharge this am (bloody show).  Interpreter Gerre Couch) present for history/physical examination.  Patient reports bloody show this am.  She notes that she has been having some occasional contractions (every 30 mins).  No loss of fluid.  She has no other complaints at this time. Baby moving well.   [facility]   Past Medical History  Diagnosis Date  . Medical history non-contributory     Past Surgical History  Procedure Laterality Date  . No past surgeries      History reviewed. No pertinent family history.  History  Substance Use Topics  . Smoking status: Never Smoker   . Smokeless tobacco: Never Used  . Alcohol Use: No    Allergies:  Allergies  Allergen Reactions  . Fish Allergy Itching    Blisters on skin    Prescriptions prior to admission  Medication Sig Dispense Refill  . hydrOXYzine (VISTARIL) 25 MG capsule Take 25 mg by mouth at bedtime.      . Prenatal Vit-Fe Fumarate-FA (PRENATAL MULTIVITAMIN) TABS Take 1 tablet by mouth daily at 12 noon.        Review of Systems  Respiratory: Negative for shortness of breath.   Cardiovascular: Negative for chest pain.  Gastrointestinal: Negative for nausea, vomiting and abdominal pain.    Physical Exam   Blood pressure 112/75, pulse 91, temperature 97.8 F (36.6 C), temperature source Oral, resp. rate 16, SpO2 99.00%.  Physical Exam Gen: well appearing, NAD. Heart: RRR. No m/r/g Lungs: CTAB. No rales, rhonchi, or wheeze.  Abd: gravid but otherwise soft, nontender. Ext: no appreciable lower extremity edema bilaterally Neuro: no focal deficits. GU: normal appearing external  genitalia   Cervical exam: 1cm/50%/-2   FHR: baseline 150, mod variability, 15x15 accels, no decels Toco: Every 10-15   MAU Course  Procedures  Assessment and Plan  Leslie Hess is a 32 y.o. [id number] at [redacted]w[redacted]d who presents with bloody show and contractions. - Patient contracting ~ every 10-15 mins. - FHT - Category 1 - Patient in latent stage of labor without complications.  Will discharge home with strict return instructions (loss of fluid, more frequent contractions, vaginal bleeding).   Everlene Other 05/27/2013, 11:02 AM   I saw and examined patient and agree with above resident note. I reviewed history, imaging, labs, and vitals. I personally reviewed the fetal heart tracing, and it is reactive. Napoleon Form, MD

## 2013-05-27 NOTE — MAU Note (Signed)
?  vag d/c - bloody mucous.  Small amt of pain

## 2013-05-28 ENCOUNTER — Encounter (HOSPITAL_COMMUNITY): Payer: Self-pay | Admitting: *Deleted

## 2013-05-28 ENCOUNTER — Inpatient Hospital Stay (HOSPITAL_COMMUNITY)
Admission: AD | Admit: 2013-05-28 | Discharge: 2013-05-29 | DRG: 775 | Disposition: A | Discharge status: Home / Self Care | Payer: Medicaid Other | Source: Ambulatory Visit | Attending: Obstetrics and Gynecology | Admitting: Obstetrics and Gynecology

## 2013-05-28 LAB — TYPE AND SCREEN
ABO/RH(D): O POS
Antibody Screen: NEGATIVE

## 2013-05-28 LAB — CBC
MCV: 85.6 fL (ref 78.0–100.0)
Platelets: 171 10*3/uL (ref 150–400)
RBC: 4.58 MIL/uL (ref 3.87–5.11)
WBC: 9.6 10*3/uL (ref 4.0–10.5)

## 2013-05-28 LAB — ABO/RH: ABO/RH(D): O POS

## 2013-05-28 MED ORDER — BISACODYL 10 MG RE SUPP: 10.0000 mg | Freq: Every day | RECTAL | Status: DC | PRN

## 2013-05-28 MED ORDER — OXYTOCIN BOLUS FROM INFUSION: 500.0000 mL | INTRAVENOUS | Status: DC

## 2013-05-28 MED ORDER — SODIUM CHLORIDE 0.9 % IJ SOLN: 3.0000 mL | INTRAMUSCULAR | Status: DC | PRN

## 2013-05-28 MED ORDER — ONDANSETRON HCL 4 MG/2ML IJ SOLN: 4.0000 mg | Freq: Four times a day (QID) | INTRAMUSCULAR | Status: DC | PRN

## 2013-05-28 MED ORDER — LANOLIN HYDROUS EX OINT: TOPICAL_OINTMENT | CUTANEOUS | Status: DC | PRN

## 2013-05-28 MED ORDER — SIMETHICONE 80 MG PO CHEW: 80.0000 mg | CHEWABLE_TABLET | ORAL | Status: DC | PRN

## 2013-05-28 MED ORDER — FLEET ENEMA 7-19 GM/118ML RE ENEM: 1.0000 | ENEMA | Freq: Every day | RECTAL | Status: DC | PRN

## 2013-05-28 MED ORDER — ACETAMINOPHEN 325 MG PO TABS: 650.0000 mg | ORAL_TABLET | ORAL | Status: DC | PRN

## 2013-05-28 MED ORDER — TETANUS-DIPHTH-ACELL PERTUSSIS 5-2.5-18.5 LF-MCG/0.5 IM SUSP: 0.5000 mL | Freq: Once | INTRAMUSCULAR | Status: DC

## 2013-05-28 MED ORDER — IBUPROFEN 600 MG PO TABS: 600.0000 mg | ORAL_TABLET | Freq: Four times a day (QID) | ORAL | Status: DC | PRN

## 2013-05-28 MED ORDER — SODIUM CHLORIDE 0.9 % IJ SOLN: 3.0000 mL | Freq: Two times a day (BID) | INTRAMUSCULAR | Status: DC

## 2013-05-28 MED ORDER — OXYCODONE-ACETAMINOPHEN 5-325 MG PO TABS
1.0000 | ORAL_TABLET | ORAL | Status: DC | PRN
  Administered 2013-05-29: 1 via ORAL
  Filled 2013-05-28: qty 1

## 2013-05-28 MED ORDER — DIBUCAINE 1 % RE OINT: 1.0000 "application " | TOPICAL_OINTMENT | RECTAL | Status: DC | PRN

## 2013-05-28 MED ORDER — PRENATAL MULTIVITAMIN CH
1.0000 | ORAL_TABLET | Freq: Every day | ORAL | Status: DC
  Administered 2013-05-28 – 2013-05-29 (×2): 1 via ORAL
  Filled 2013-05-28 (×2): qty 1

## 2013-05-28 MED ORDER — OXYTOCIN 40 UNITS IN LACTATED RINGERS INFUSION - SIMPLE MED
62.5000 mL/h | INTRAVENOUS | Status: DC
  Administered 2013-05-28: 999 mL/h via INTRAVENOUS
  Filled 2013-05-28: qty 1000

## 2013-05-28 MED ORDER — DIPHENHYDRAMINE HCL 25 MG PO CAPS: 25.0000 mg | ORAL_CAPSULE | Freq: Four times a day (QID) | ORAL | Status: DC | PRN

## 2013-05-28 MED ORDER — ONDANSETRON HCL 4 MG PO TABS: 4.0000 mg | ORAL_TABLET | ORAL | Status: DC | PRN

## 2013-05-28 MED ORDER — SODIUM CHLORIDE 0.9 % IV SOLN: 250.0000 mL | INTRAVENOUS | Status: DC | PRN

## 2013-05-28 MED ORDER — BENZOCAINE-MENTHOL 20-0.5 % EX AERO
1.0000 "application " | INHALATION_SPRAY | CUTANEOUS | Status: DC | PRN
  Administered 2013-05-29: 1 via TOPICAL
  Filled 2013-05-28: qty 56

## 2013-05-28 MED ORDER — CITRIC ACID-SODIUM CITRATE 334-500 MG/5ML PO SOLN: 30.0000 mL | ORAL | Status: DC | PRN

## 2013-05-28 MED ORDER — ZOLPIDEM TARTRATE 5 MG PO TABS: 5.0000 mg | ORAL_TABLET | Freq: Every evening | ORAL | Status: DC | PRN

## 2013-05-28 MED ORDER — LACTATED RINGERS IV SOLN: INTRAVENOUS | Status: DC

## 2013-05-28 MED ORDER — MEASLES, MUMPS & RUBELLA VAC ~~LOC~~ INJ
0.5000 mL | INJECTION | Freq: Once | SUBCUTANEOUS | Status: DC
  Filled 2013-05-28: qty 0.5

## 2013-05-28 MED ORDER — FENTANYL CITRATE 0.05 MG/ML IJ SOLN
100.0000 ug | INTRAMUSCULAR | Status: DC | PRN
  Administered 2013-05-28 (×2): 100 ug via INTRAVENOUS
  Filled 2013-05-28 (×2): qty 2

## 2013-05-28 MED ORDER — SENNOSIDES-DOCUSATE SODIUM 8.6-50 MG PO TABS
2.0000 | ORAL_TABLET | Freq: Every day | ORAL | Status: DC
  Administered 2013-05-28: 2 via ORAL

## 2013-05-28 MED ORDER — LACTATED RINGERS IV SOLN
500.0000 mL | INTRAVENOUS | Status: DC | PRN
  Administered 2013-05-28: 500 mL via INTRAVENOUS

## 2013-05-28 MED ORDER — OXYCODONE-ACETAMINOPHEN 5-325 MG PO TABS: 1.0000 | ORAL_TABLET | ORAL | Status: DC | PRN

## 2013-05-28 MED ORDER — IBUPROFEN 600 MG PO TABS
600.0000 mg | ORAL_TABLET | Freq: Four times a day (QID) | ORAL | Status: DC
  Administered 2013-05-28 – 2013-05-29 (×5): 600 mg via ORAL
  Filled 2013-05-28 (×5): qty 1

## 2013-05-28 MED ORDER — WITCH HAZEL-GLYCERIN EX PADS: 1.0000 "application " | MEDICATED_PAD | CUTANEOUS | Status: DC | PRN

## 2013-05-28 MED ORDER — ONDANSETRON HCL 4 MG/2ML IJ SOLN: 4.0000 mg | INTRAMUSCULAR | Status: DC | PRN

## 2013-05-28 MED ORDER — OXYTOCIN 40 UNITS IN LACTATED RINGERS INFUSION - SIMPLE MED: 62.5000 mL/h | INTRAVENOUS | Status: DC | PRN

## 2013-05-28 MED ORDER — LIDOCAINE HCL (PF) 1 % IJ SOLN
30.0000 mL | INTRAMUSCULAR | Status: DC | PRN
  Filled 2013-05-28 (×2): qty 30

## 2013-05-28 NOTE — MAU Provider Note (Signed)
Attestation of Attending Supervision of Advanced Practitioner (CNM/NP): Evaluation and management procedures were performed by the Advanced Practitioner under my supervision and collaboration.  I have reviewed the Advanced Practitioner's note and chart, and I agree with the management and plan.  HARRAWAY-SMITH, Yesena Reaves 8:41 AM

## 2013-05-28 NOTE — H&P (Signed)
Leslie Hess is a 32 y.o. female G3P2002 at 39.2wks presenting for eval of labor. Denies leak or bldg. [facility] and it has been essentially unremarkable. She was here yesterday for a labor eval and her cx was 1/50/-2. History OB History   Grav Para Term Preterm Abortions TAB SAB Ect Mult Living   3 2 2       2      Past Medical History  Diagnosis Date  . Medical history non-contributory    Past Surgical History  Procedure Laterality Date  . No past surgeries     Family History: family history is not on file. Social History:  reports that she has never smoked. She has never used smokeless tobacco. She reports that she does not drink alcohol or use illicit drugs.   Prenatal Transfer Tool  Maternal Diabetes: No Genetic Screening: Normal Maternal Ultrasounds/Referrals: Normal Fetal Ultrasounds or other Referrals:  None Maternal Substance Abuse:  No Significant Maternal Medications:  None Significant Maternal Lab Results:  Lab values include: Group B Strep negative Other Comments:  None  ROS  Dilation: 4 Effacement (%): 80 Station: -2 Exam by:: M.Topp,RN (initially cx was 2.5/80/-2 and then changed after one hour) Blood pressure 115/74, pulse 58, temperature 97.9 F (36.6 C), temperature source Oral, resp. rate 15, height 5' (1.524 m), weight 59.421 kg (131 lb). Maternal Exam:  Uterine Assessment: Ctx q 4-6 mins     Fetal Exam Fetal Monitor Review: Baseline rate: 130.  Variability: moderate (6-25 bpm).   Pattern: accelerations present and no decelerations.       Physical Exam  Constitutional: She is oriented to person, place, and time. She appears well-developed.  HENT:  Head: Normocephalic.  Cardiovascular: Normal rate.   Respiratory: Effort normal.  Musculoskeletal: Normal range of motion.  Neurological: She is alert and oriented to person, place, and time.  Skin: Skin is warm and dry.  Psychiatric: She has a normal mood and  affect. Her behavior is normal. Thought content normal.    Prenatal labs: ABO, Rh: O/Positive/-- (12/16 0000) Antibody: Negative (12/16 0000) Rubella: Immune (12/16 0000) RPR: Nonreactive (12/16 0000)  HBsAg: Negative (12/16 0000)  HIV: Non-reactive (12/16 0000)  GBS: Negative (05/15 0000)   Assessment/Plan: IUP at term Latent phase  Admit to [facility] due to cx change while in MAU Expectant management Anticipate SVD   Cam Hai 05/28/2013, 5:49 AM

## 2013-05-29 MED ORDER — IBUPROFEN 600 MG PO TABS: 600.0000 mg | ORAL_TABLET | Freq: Four times a day (QID) | ORAL | Status: DC | PRN

## 2013-05-29 NOTE — Discharge Summary (Signed)
Obstetric Discharge Summary Reason for Admission: onset of labor Prenatal Procedures: ultrasound Intrapartum Procedures: spontaneous vaginal delivery Postpartum Procedures: none Complications-Operative and Postpartum: none Eating, drinking, voiding, ambulating well.  +flatus.  Lochia and pain wnl.  Denies dizziness, lightheadedness, or sob. No complaints.  Desires early d/c  Hemoglobin  Date Value Range Status  05/28/2013 13.4  12.0 - 15.0 g/dL Final     HCT  Date Value Range Status  05/28/2013 39.2  36.0 - 46.0 % Final    Physical Exam:  General: alert, cooperative and no distress Lochia: appropriate Uterine Fundus: firm Incision: n/a DVT Evaluation: No evidence of DVT seen on physical exam. Negative Homan's sign. No cords or calf tenderness. No significant calf/ankle edema.  Discharge Diagnoses: Term Pregnancy-delivered  Discharge Information: Date: 05/29/2013 Activity: pelvic rest Diet: routine Medications: PNV and Ibuprofen Condition: stable Instructions: refer to practice specific booklet Discharge to: home Follow-up Information   Follow up with [facility] [place]. Schedule an appointment as soon as possible for a visit in 6 weeks. (for postpartum visit)    Contact information:   [address] [place] [address] [phone]      Newborn Data: Live born female  Birth Weight: 6 lb 6.1 oz (2895 g) APGAR: 9, 9  Home with mother. Breast/bottlefeeding, IUD-thinks it is paragard for contraception  Marge Duncans 05/29/2013, 7:27 AM

## 2013-05-29 NOTE — H&P (Signed)
Attestation of Attending Supervision of Advanced Practitioner: Evaluation and management procedures were performed by the PA/NP/CNM/OB Fellow under my supervision/collaboration. Chart reviewed and agree with management and plan.  Stephnie Parlier V 05/29/2013 6:31 PM   

## 2013-05-29 NOTE — Discharge Summary (Signed)
Attestation of Attending Supervision of Advanced Practitioner (CNM/NP): Evaluation and management procedures were performed by the Advanced Practitioner under my supervision and collaboration.  I have reviewed the Advanced Practitioner's note and chart, and I agree with the management and plan.  HARRAWAY-SMITH, Bronco Mcgrory 7:53 AM     

## 2013-05-30 NOTE — Progress Notes (Signed)
Post discharge chart review completed.  

## 2014-06-06 ENCOUNTER — Encounter (HOSPITAL_COMMUNITY): Payer: Self-pay | Admitting: Emergency Medicine

## 2014-06-06 ENCOUNTER — Emergency Department (HOSPITAL_COMMUNITY)
Admission: EM | Admit: 2014-06-06 | Discharge: 2014-06-06 | Disposition: A | Discharge status: Home / Self Care | Payer: Medicaid Other | Attending: Emergency Medicine | Admitting: Emergency Medicine

## 2014-06-06 ENCOUNTER — Emergency Department (HOSPITAL_COMMUNITY): Payer: Medicaid Other

## 2014-06-06 DIAGNOSIS — S92309A Fracture of unspecified metatarsal bone(s), unspecified foot, initial encounter for closed fracture: Secondary | ICD-10-CM | POA: Insufficient documentation

## 2014-06-06 DIAGNOSIS — Y9302 Activity, running: Secondary | ICD-10-CM | POA: Diagnosis not present

## 2014-06-06 DIAGNOSIS — X500XXA Overexertion from strenuous movement or load, initial encounter: Secondary | ICD-10-CM | POA: Diagnosis not present

## 2014-06-06 DIAGNOSIS — S92909A Unspecified fracture of unspecified foot, initial encounter for closed fracture: Secondary | ICD-10-CM

## 2014-06-06 DIAGNOSIS — S82409A Unspecified fracture of shaft of unspecified fibula, initial encounter for closed fracture: Secondary | ICD-10-CM

## 2014-06-06 DIAGNOSIS — S82899A Other fracture of unspecified lower leg, initial encounter for closed fracture: Secondary | ICD-10-CM | POA: Diagnosis not present

## 2014-06-06 DIAGNOSIS — S8990XA Unspecified injury of unspecified lower leg, initial encounter: Secondary | ICD-10-CM | POA: Diagnosis present

## 2014-06-06 DIAGNOSIS — Y929 Unspecified place or not applicable: Secondary | ICD-10-CM | POA: Insufficient documentation

## 2014-06-06 IMAGING — CR DG ANKLE COMPLETE 3+V*R*
3 series · 3 of 3 positions shown · non-contrast
Comparison: None.

CLINICAL DATA: Pain post fall 3 days ago

EXAM:
RIGHT ANKLE - COMPLETE 3+ VIEW

[t ankle joint lat right]
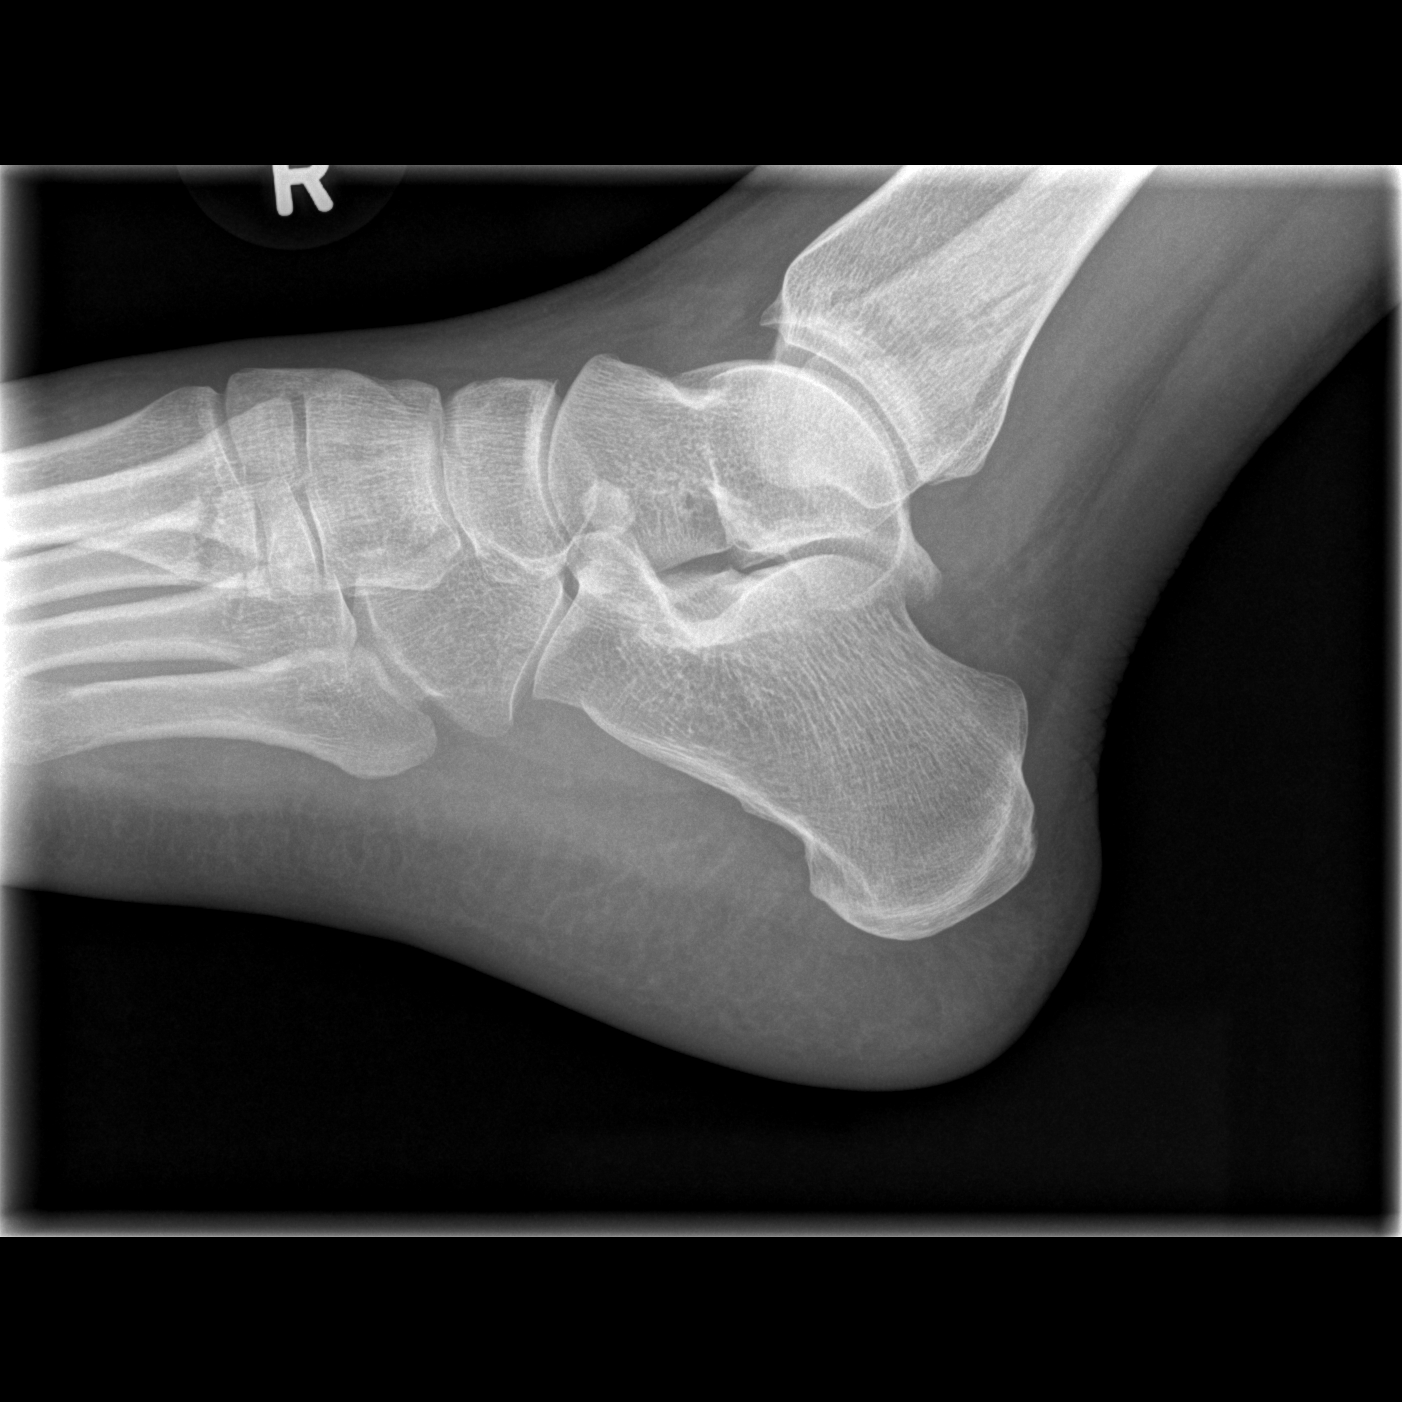

[t ankle joint ap right]
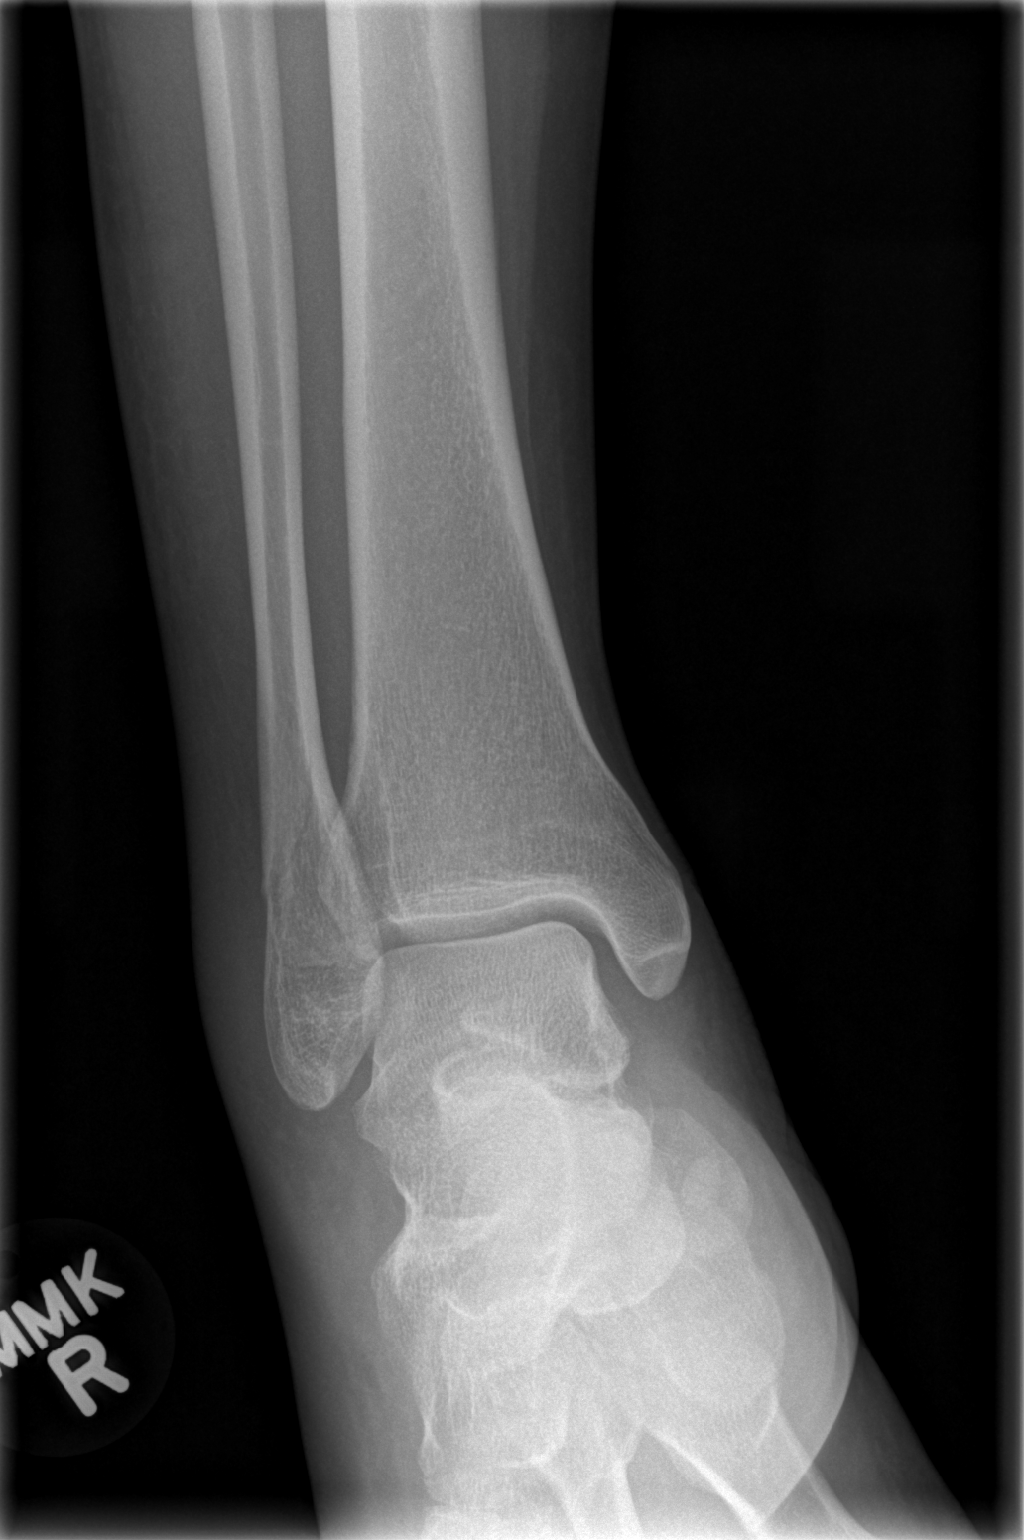

[t ankle joint oblique right]
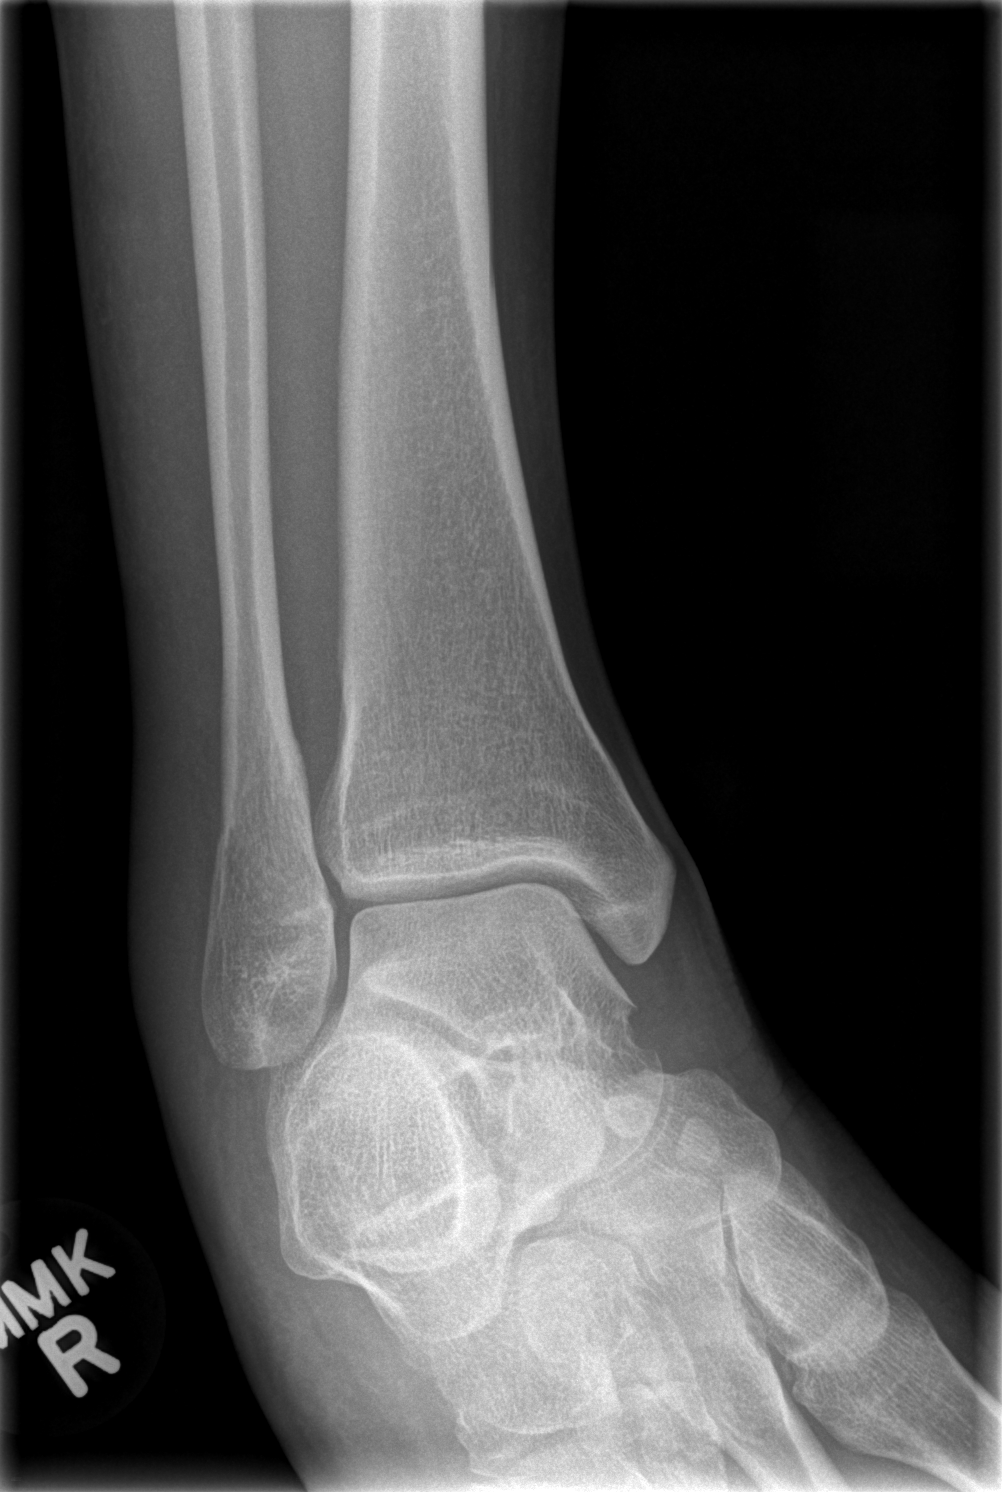

[3 of 3 positions shown; findings below may reference images not displayed]

FINDINGS: Three views of the right ankle submitted. There is oblique
nondisplaced fracture in distal right fibula with minimal cortical
step-off. Mild disruption of Ankle mortise with mild widening of
medial tibiotalar space. Soft tissue swelling adjacent to lateral
malleolus. Fracture at the base of third and fourth metatarsal.
IMPRESSION: There is oblique nondisplaced fracture in distal right fibula with
minimal cortical step-off. Mild disruption of Ankle mortise with
mild widening of medial tibiotalar space. Soft tissue swelling
adjacent to lateral malleolus. Fracture at the base of third and
fourth metatarsal.

## 2014-06-06 IMAGING — CR DG FOOT COMPLETE 3+V*R*
3 series · 3 of 3 positions shown · non-contrast
Comparison: None.

CLINICAL DATA: Pain post trauma

EXAM:
RIGHT FOOT COMPLETE - 3+ VIEW

[t foot ap right]
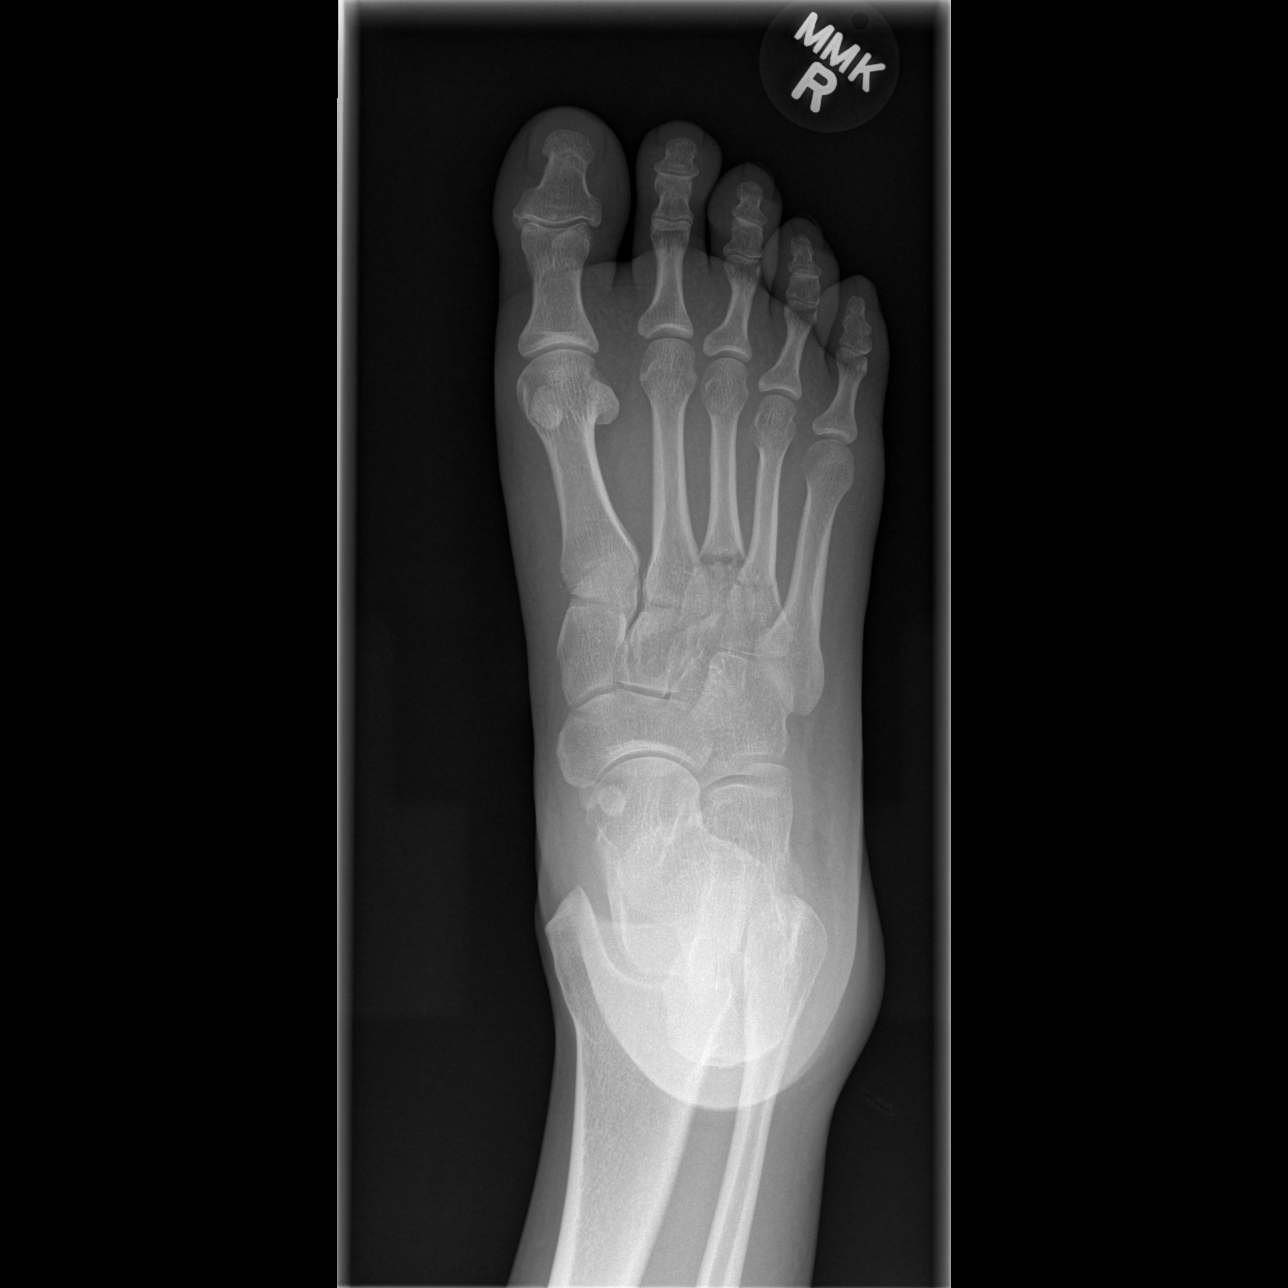

[t foot oblique right]
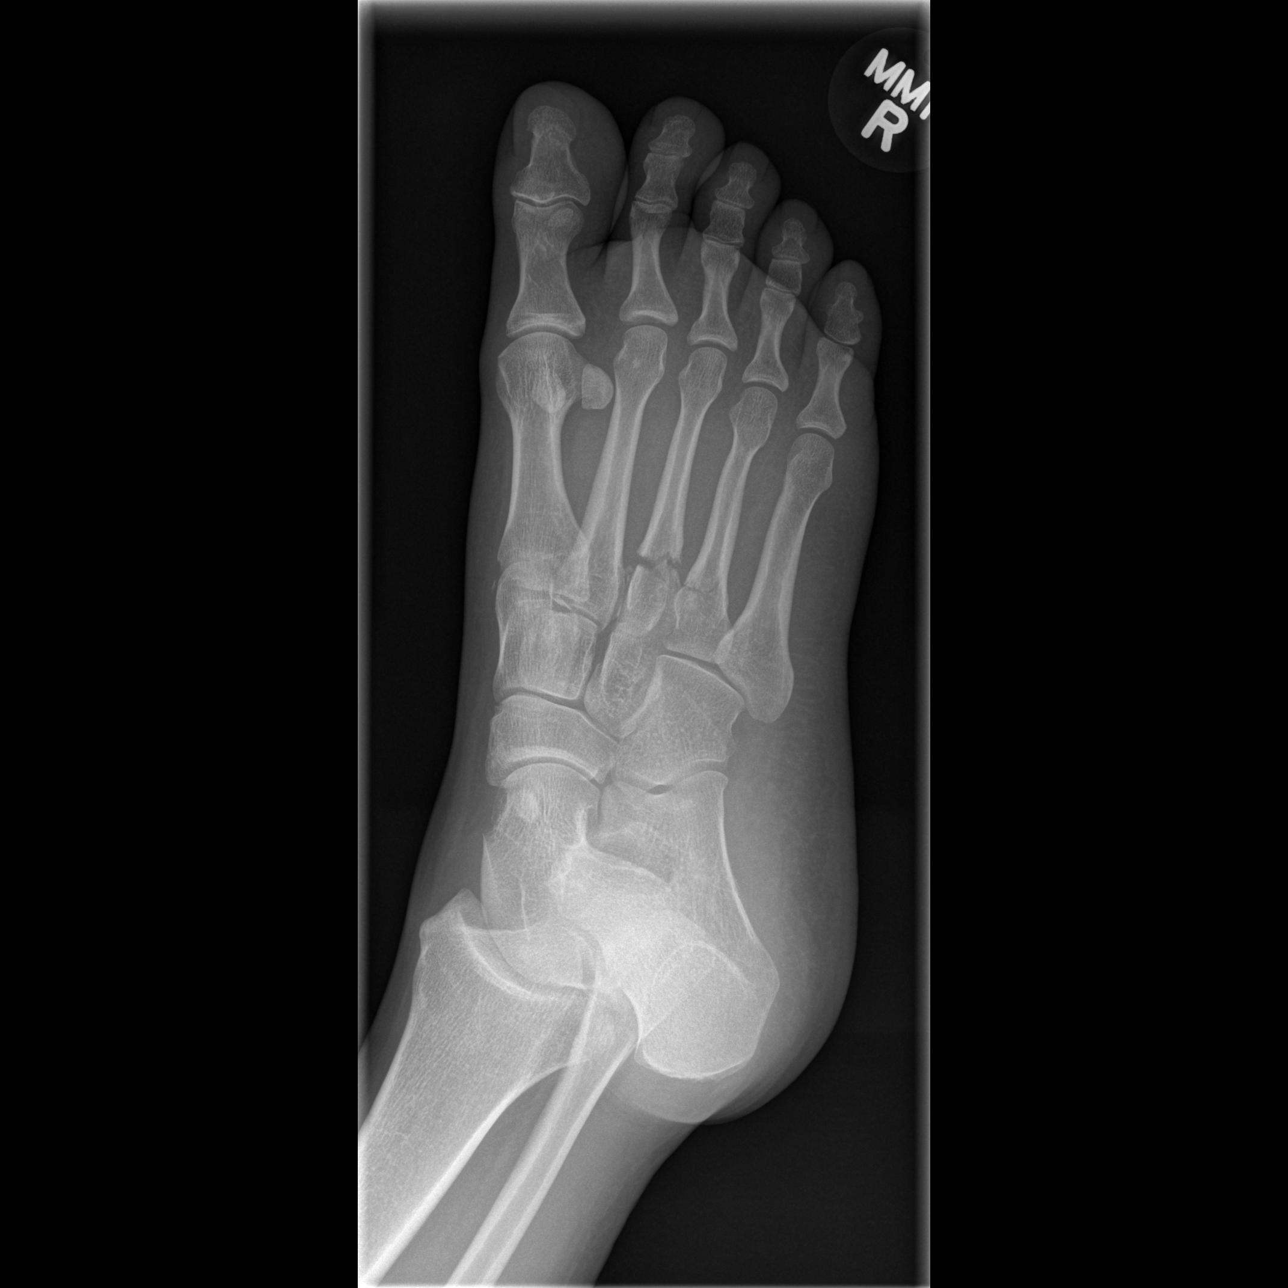

[t foot lat right]
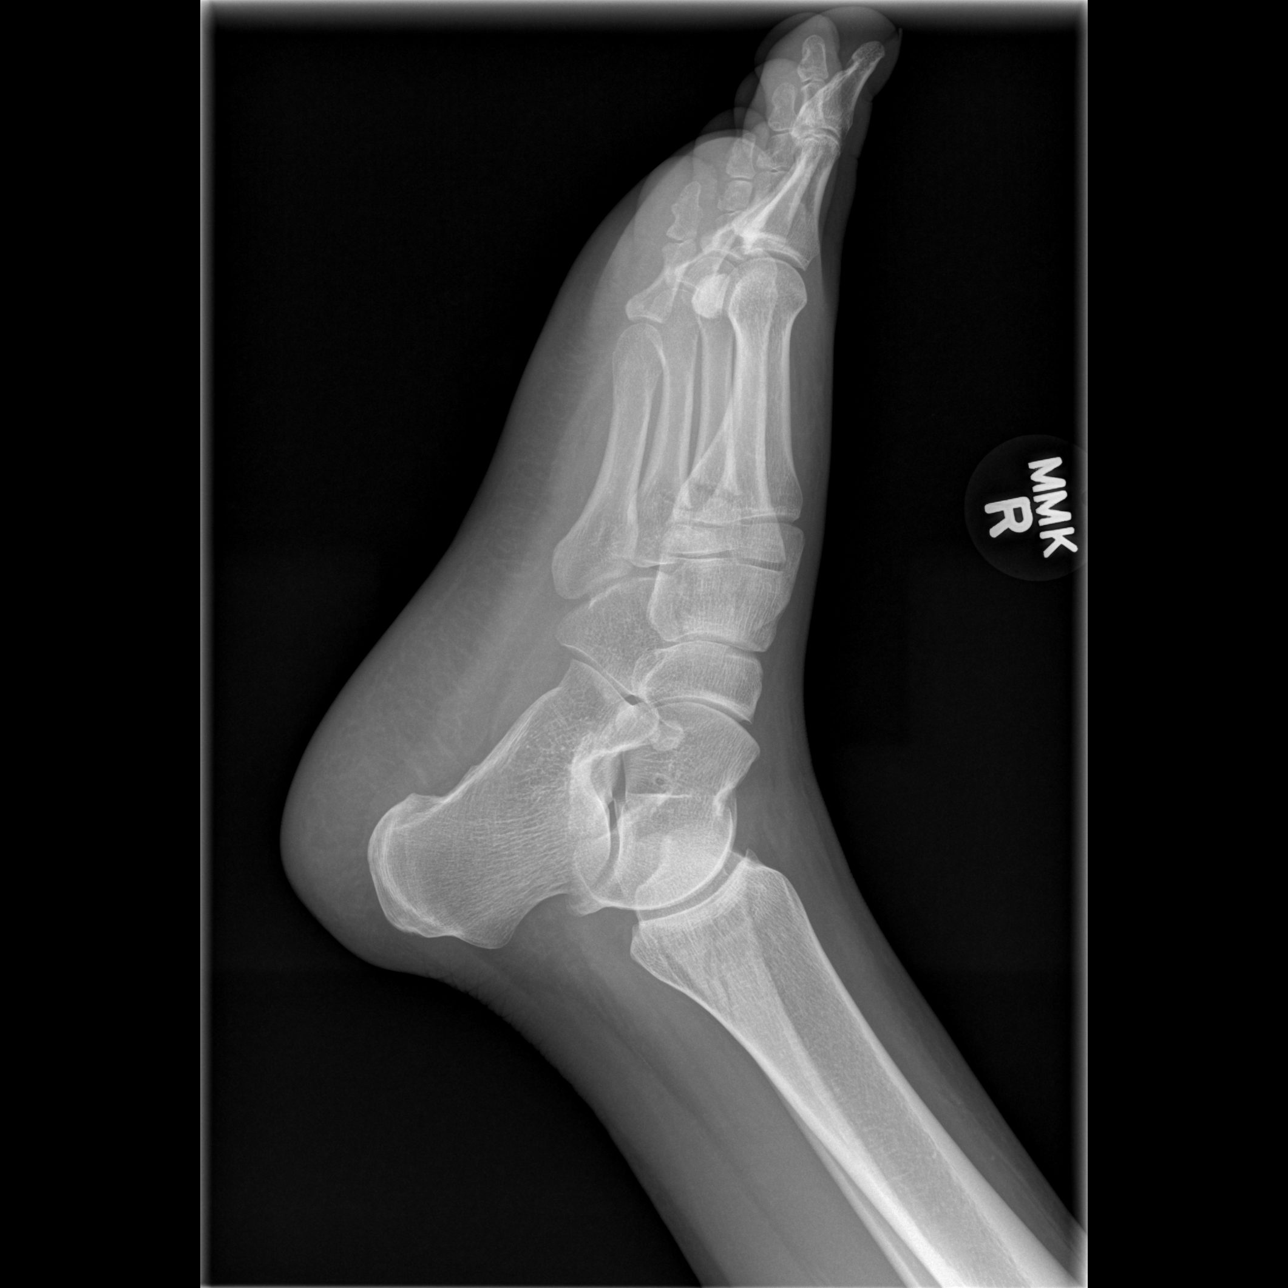

[3 of 3 positions shown; findings below may reference images not displayed]

FINDINGS: Frontal, oblique, and lateral views were obtained. There is a
fracture of the third proximal metatarsal with displaced fracture
fragments. There are nondisplaced fractures of the proximal second
and fourth metatarsals. The fracture of the distal fibula is also
noted on the lateral view. No other fractures are apparent. No
dislocation. The joint spaces appear intact.
IMPRESSION: Displaced fracture of the proximal third metatarsal. Nondisplaced
fractures of the second and fourth proximal metatarsals. Fracture
distal fibula, better seen on ankle images.

## 2014-06-06 MED ORDER — HYDROCODONE-ACETAMINOPHEN 5-325 MG PO TABS: 1.0000 | ORAL_TABLET | ORAL | Status: DC | PRN

## 2014-06-06 MED ORDER — HYDROCODONE-ACETAMINOPHEN 5-325 MG PO TABS
1.0000 | ORAL_TABLET | Freq: Once | ORAL | Status: AC
  Administered 2014-06-06: 1 via ORAL
  Filled 2014-06-06: qty 1

## 2014-06-06 NOTE — Discharge Instructions (Signed)
Cuidados del yeso o la férula °(Cast or Splint Care) °El yeso y las férulas sostienen los miembros lesionados y evitan que los huesos se muevan hasta que se curen. Es importante que cuide el yeso o la férula cuando se encuentre en su casa.   °INSTRUCCIONES PARA EL CUIDADO EN EL HOGAR °· Mantenga el yeso o la férula al descubierto durante el tiempo de secado. Puede tardar entre 24 y 48 horas para secarse si está hecho de yeso. La fibra de vidrio se seca en menos de 1 hora. °· No apoye el yeso sobre nada que sea más duro que una almohada durante 24 horas. °· No aplique peso sobre el miembro lesionado ni haga presión sobre el yeso hasta que el médico lo autorice. °· Mantenga el yeso o la férula secos. Al mojarse pueden perder la forma y podría ocurrir que no soporten el miembro. Un yeso mojado que ha perdido su forma puede presionar de manera peligrosa en la piel al secarse. Además, la piel mojada podría infectarse. °· Cubra el yeso o la férula con una bolsa plástica cuando tome un baño o cuando salga al exterior en días de lluvia o nieve. Si el yeso está colocado sobre el tronco, deberá bañarse pasando una esponja por el cuerpo, hasta que se lo retiren. °· Si el yeso se moja, séquelo con una toalla o con un secador de cabello sólo en posición de aire frío. °· Mantenga el yeso o la férula limpios. Si el yeso se ensucia, puede limpiarlo con un paño húmedo. °· No coloque objetos extraños duros o blandos debajo del yeso o cabestrillo, como algodón, papel higiénico, loción o talco. °· No se rasque la piel por debajo del molde con ningún objeto. Podría quedar adherido al yeso. Además, el rascado puede causar una infección. Si siente picazón, use un secador de cabello con aire frío sobre la zona que pica para aliviar las molestias. °· No recorte ni quite el relleno acolchado que se encuentra debajo del yeso. °· Ejercite todas las articulaciones que no estén inmovilizadas por el yeso o férula. Por ejemplo, si tiene un yeso  largo de pierna, ejercite la articulación de la cadera y los dedos de los pies. Si tiene un brazo enyesado o entablillado, ejercite el hombro, el codo, el pulgar y los dedos de la mano. °· Eleve el brazo o la pierna sobre 1 ó 2 almohadas durante los primeros 3 días para disminuir la hinchazón y el dolor.Es mejor si puede elevar cómodamente el yeso para que quede más arriba del nivel del corazón. °SOLICITE ATENCIÓN MÉDICA SI:  °· El yeso o la férula se quiebran. °· Siente que el yeso o la férula están muy apretados o muy flojos. °· Tiene una picazón insoportable debajo del yeso. °· El yeso se moja o tiene una zona blanda. °· Siente un feo olor que proviene del interior del yeso. °· Algún objeto se queda atascado bajo el yeso. °· La piel que rodea el yeso enrojece o se vuelve sensible. °· Siente un dolor nuevo o el dolor que sentía empeora luego de la aplicación del yeso. °SOLICITE ATENCIÓN MÉDICA DE INMEDIATO SI:  °· Observa un líquido que sale por el yeso. °· No puede mover el dedo lesionado. °· Los dedos le cambian de color (blancos o azules), siente frío, dolor o por fuera del yeso los dedos están muy inflamados. °· Siente hormigueo o adormecimiento alrededor de la zona de la lesión. °· Siente un dolor o presión intensos debajo del yeso. °·   Presenta dificultad para respirar o Company secretary.  Siente dolor en el pecho. Document Released: 12/15/2005 Document Revised: 10/05/2013 [facility] Patient Information 2014 [place], [place]. Crioterapia  (Cryotherapy)  El trmino crioterapia significa tratamiento mediante el fro. Bolsas con hielo o gel se utilizan para reducir Chief Technology Officer y la inflamacin. El hielo es ms efectivo dentro de las primeras 24 a 48 horas despus de una lesin o trastornos por uso excesivo de un msculo o Risk analyst. El hielo puede calmar esguinces, distensiones, espasmos, ardor, dolor punzante y [employer]. Tambin puede usarse para la recuperacin luego de [place]. El hielo es  [place], tiene muy pocos efectos adversos y es seguro para que lo utilicen la mayora de [employer].  PRECAUCIONES  El hielo no es una opcin segura de tratamiento para las personas con:   Fenmeno de [place]. Este es un trastorno que afecta los vasos sanguneos pequeos en las extremidades. La exposicin al fro [employer] problemas vuelvan.  Hipersensibilidad al fro. Hay diferentes tipos de hipersensibilidad al fro, [employer] se incluyen:  Urticaria por el fro. Ronchas rojas y que pican que aparecen en la piel cuando los tejidos comienzan a calentarse despus de recibir el fro.  Eritema por fro. Se trata de una erupcin de color rojo y que pica, causada por la exposicin al fro.  Hemoglobinuria por fro. Los glbulos rojos se destruyen cuando los tejidos comienzan a calentarse despus de enfriarse. La hemoglobina que transporta oxgeno pasa a la orina debido a que no se puede combinar con protenas de la sangre lo suficientemente rpido.  Entumecimiento o alteracin de la sensibilidad en el rea que se enfra. Si usted tiene [employer], no utilice hielo hasta que haya hablado con su mdico:   Enfermedades cardacas, como arritmias, angina o enfermedad cardaca crnica.  Hipertensin arterial.  Heridas que se estn curando o abiertas en la zona en la que va a aplicar el hielo.  Infecciones actuales.  Artritis reumatoidea.  Mala circulacin.  Diabetes. El hielo disminuye el flujo de sangre en la regin en la que se aplica. Esto es beneficioso cuando se trata de evitar que se propaguen ciertas sustancias qumicas irritantes desde los tejidos inflamados a los tejidos circundantes. Sin embargo, si se expone la piel a las temperaturas fras durante demasiado tiempo o sin la proteccin [place], puede daarse la piel o los nervios. Observe si hay seales de dao en la piel debido al fro.  INSTRUCCIONES PARA EL CUIDADO EN EL HOGAR  Siga  estos consejos para usar hielo y compresas fras con seguridad.   Coloque una toalla seca o hmeda entre el hielo y la piel. Una toalla hmeda se enfriar ms rpidamente la piel, lo que puede hacer necesario acortar el tiempo que se utiliza el hielo.  Para obtener una respuesta ms rpida, puede comprimir suavemente el hielo.  Aplique el hielo durante no ms de 10 a 20 minutos a la vez. Cuanto ms hueso haya en la zona en la que aplique el hielo, menos tiempo se necesitar para obtener los beneficios.  Revise su piel despus de 5 minutos para asegurarse de que no hay seales de [employer] al fro o un dao en la piel.  Descanse 20 minutos o ms [employer].  Una vez que la piel est adormecida, puede finalizar el [place]. Puede probar si hay adormecimiento tocando ligeramente la piel. El toque debe ser tan ligero que no deje un hoyuelo en  la piel por la presin hecha con la punta del dedo. Al aplicar hielo, la [employer] de las personas sentirn sensaciones normales en este orden: fro, ardor, dolor y entumecimiento.  No use hielo sobre alguien que no puede comunicar sus respuestas al dolor, como los nios pequeos o personas con demencia. CMO HACER UNA COMPRESA DE HIELO  Las compresas de hielo son el modo ms frecuente de Chemical engineer la terapia con hielo. Otros mtodos son los masajes con hielo, baos de hielo, y aerosol fro. Las cremas musculares que producen fro, sensacin de hormigueo no ofrecen los mismos beneficios que ofrece el hielo y no debe ser utilizado como un sustituto excepto que lo recomiende su mdico.  Para hacer una compresa de hielo, haga lo siguiente:   Ponga hielo picado o una bolsa de verduras congeladas en una bolsa de plstico con cierre. Extraiga el exceso de [place]. Coloque esta bolsa dentro de [place] bolsa de plstico. Deslice la bolsa en una funda de almohada o coloque una toalla hmeda entre su piel y la [place].  Mezcle 3 partes de agua con 1 parte de  alcohol fino. Congelar la mezcla en una bolsa plstica con cierre. Cuando se retira Set designer del Electrical engineer, tendr un aspecto fangoso. Extraiga el exceso de [place]. Coloque esta bolsa dentro de [place] bolsa de plstico. Deslice la bolsa en una funda de almohada o coloque una toalla hmeda entre su piel y la [place]. SOLICITE ATENCIN MDICA SI:   Tiene manchas blancas en la piel. Esto puede dar a la piel una apariencia (moteada).  Su piel se vuelve azul o plida.  Tiene un aspecto ceroso o est dura.  La hinchazn empeora. ASEGRESE DE QUE:   Comprende estas instrucciones.  Controlar su enfermedad.  Solicitar ayuda de inmediato si no mejora o si empeora. Document Released: 12/04/2011 Document Revised: 03/08/2012 [facility] Patient Information 2014 [place], [place]. Fractura de peron (en el tobillo), no desplazada, tratada con inmovilizacin, en adultos (Fibular Fracture, Ankle, Adult, Undisplaced, Treated With Immobilization) Una fractura simple del hueso debajo de la rodilla en la parte externa de la pierna (peron) normalmente se cura sin problema. CAUSAS Por lo general, una fractura de peron ocurre debido a un traumatismo. Un golpe en la zona lateral de la pierna o un movimiento brusco de torsin Naval architect. Las fracturas de peron a menudo ocurren como consecuencia de lesiones de ftbol americano, ftbol o esqu. SNTOMAS Estos pueden ser [employer] de los sntomas de una fractura de peron:  Engineer, mining.  Acortamiento de Regulatory affairs officer, o alineacin anormal, de la parte inferior de la pierna (angulacin). DIAGNSTICO Un mdico deber examinar la pierna. Le solicitarn radiografas para confirmar la fractura y Development worker, community la magnitud de la lesin. TRATAMIENTO  Normalmente se coloca un yeso o inmovilizador. Algunas veces se coloca una frula en estas fracturas a fin de brindar mayor comodidad, o si los huesos se encuentran fuera de Environmental consultant. Es posible que necesite muletas para que  pueda movilizarse.  INSTRUCCIONES PARA EL CUIDADO EN EL HOGAR   Aplique hielo sobre la zona lesionada.  Ponga el hielo en una bolsa plstica.  Coloque una toalla entre la piel y la bolsa de hielo.  Deje el hielo durante 20 minutos, 2 a 3 veces por da.  Utilice las [employer] del modo en que se lo indicaron. Reanude las caminatas sin muletas cuando el mdico se lo indique o cuando se sienta cmodo para hacerlo.  Utilice los medicamentos de venta libre o recetados para Primary school teacher,  el malestar o la fiebre, segn se lo indique el mdico.  Si mantiene la pierna [place], quizs disminuya la hinchazn.  Si tiene una frula o bota extrable, no se las saque, a menos que as se lo indique su mdico.  No conduzca hasta que el mdico le indique especficamente que ya no corre Surveyor, minerals. SOLICITE ATENCIN MDICA DE INMEDIATO SI:   Su yeso se daa o se rompe.  Siente un dolor intenso y continuo o est ms hinchado que antes de colocarle el yeso o el dolor no se alivia con los medicamentos.  La piel o las uas que se encuentren por debajo de la lesin se vuelven azules o grises, o estn fras o adormecidas.  Siente un mal olor u observa pus que proviene del interior del [place].  Comienza a sentir un dolor intenso en el tobillo o en el pie. ASEGRESE DE QUE:   Comprende estas instrucciones.  Controlar su afeccin.  Recibir ayuda de inmediato si no mejora o si empeora. Document Released: 12/15/2005 Document Revised: 10/05/2013 [facility] Patient Information 2014 [place], [place]. Fractura del metatarso sin desplazamiento (Metatarsal Fracture, Undisplaced) Usted ha sufrido una fractura (quebradura) en uno o ms huesos del pie. Estos huesos [employer] dedos con los huesos del tobillo. DIAGNSTICO El diagnstico (conocer el problema) de estas fracturas generalmente se realiza con facilidad, por medio de radiografas. Si hay problemas en el antepi y las radiografas son normales, un  control posterior con gammagrafa sea puede asegurar el diagnstico.  TRATAMIENTO E INSTRUCCIONES PARA EL CUIDADO DOMICILIARIO   El tratamiento podr incluir o no un yeso o una bota. Cuando es Programmer, applications un yeso, generalmente se [place] por un perodo breve para no hacer ms lenta la curacin por la atrofia muscular (prdida del msculo).  Debe suspender las actividades hasta que el profesional que lo asiste se lo indique.  Use zapatos que permitan amortiguar los impactos.  Podr Charity fundraiser espera que el hueso se cure. Entre ellos se incluyen el ciclismo y la natacin, o aquellos que el profesional le aconseje.  Es importante concurrir a todas las visitas de seguimiento o a las derivaciones a Geophysical data processor. Si no cumple con el seguimiento podr resultar en la curacin incorrecta del hueso, dolor crnico o discapacidad. SI NO LE HAN COLOCADO UN YESO O UNA TABLILLA:  Podr caminar con el pie lesionado segn lo tolere o como se lo hayan aconsejado.  No apoye el peso sobre el pie lesionado durante el tiempo que se lo indique su mdico. Aumente lentamente la cantidad de tiempo que camina sobre el pie, hasta que el dolor se lo permita, o segn se lo hayan indicado.  Use muletas hasta que pueda soportar el peso sin dolor. Un aumento gradual del [address]  Aplique hielo sobre la lesin durante 15 a 20 minutos por hora mientras se encuentre despierto, durante los 2 primeros [place]. Ponga el hielo en una bolsa plstica y coloque una toalla entre la bolsa y la piel.  Utilice los medicamentos de venta libre o de prescripcin para Chief Technology Officer, Environmental health practitioner o la [place], segn se lo indique el profesional que lo asiste. SOLICITE ATENCIN MDICA DE INMEDIATO SI:  El yeso se daa o se rompe.  Siente un dolor intenso y continuo o est ms hinchado que antes de colocarle el yeso o el dolor no se alivia con los medicamentos.  La piel o las uas que se encuentran  por debajo de la lesin se vuelven  azules o grises, o siente fro o entumecimiento.  Hay mal olor o aparecen nuevas manchas o un drenaje purulento (similar al pus) por debajo del yeso. EST SEGURO QUE:   Comprende las instrucciones para el alta mdica.  Controlar su enfermedad.  Solicitar atencin mdica de inmediato segn las indicaciones. Document Released: 09/24/2005 Document Revised: 03/08/2012 [facility] Patient Information 2014 [place].

## 2014-06-06 NOTE — Progress Notes (Signed)
Orthopedic Tech Progress Note Patient Details:  Leslie Hess 11-Jul-1981 [id number]  Ortho Devices Type of Ortho Device: Crutches;Stirrup splint;Post (short leg) splint Ortho Device/Splint Interventions: Application   Mickie Bail Cammer 06/06/2014, 2:16 PM

## 2014-06-06 NOTE — ED Notes (Signed)
Patient transported to X-ray 

## 2014-06-06 NOTE — ED Notes (Signed)
Pt in c/o pain to right foot after slipping on Sunday, swelling noted

## 2014-06-06 NOTE — ED Notes (Signed)
Paged Ortho Tech for splint and crutches.

## 2014-06-06 NOTE — ED Provider Notes (Signed)
CSN: [id number]     Arrival date & time 06/06/14  1216 History  This chart was scribed for non-physician practitioner, Elpidio AnisShari Hever Castilleja, PA-C working with Suzi RootsKevin E Steinl, MD by Greggory StallionKayla Andersen, ED scribe. This patient was seen in room TR08C/TR08C and the patient's care was started at 12:29 PM.   Chief Complaint  Patient presents with  . Foot Injury   The history is provided by the patient. A language interpreter was used.   HPI Comments: Leslie Hess is a 33 y.o. female who presents to the Emergency Department complaining of right foot and ankle injury that occurred 2 days ago. Pt was running around with her children, accidentally bent her foot backwards and landed on her right hip and leg. States she heard a pop in her foot when it happened. She has sudden onset right foot and ankle pain with associated swelling and right hip and leg pain. Bearing weight worsens the pain. She has taken ibuprofen with little relief.   Past Medical History  Diagnosis Date  . Medical history non-contributory    Past Surgical History  Procedure Laterality Date  . No past surgeries     Family History  Problem Relation Age of Onset  . Diabetes Mother   . Diabetes Maternal Grandmother    History  Substance Use Topics  . Smoking status: Never Smoker   . Smokeless tobacco: Never Used  . Alcohol Use: No   OB History   Grav Para Term Preterm Abortions TAB SAB Ect Mult Living   3 3 3  0 0 0 0 0 0 3     Review of Systems  Musculoskeletal: Positive for arthralgias, joint swelling and myalgias.  All other systems reviewed and are negative.  Allergies  Fish allergy  Home Medications   Prior to Admission medications   Medication Sig Start Date End Date Taking? Authorizing Provider  ibuprofen (ADVIL,MOTRIN) 600 MG tablet Take 1 tablet (600 mg total) by mouth every 6 (six) hours as needed for pain. 05/29/13   Marge DuncansKimberly Randall Booker, CNM  Prenatal Vit-Fe Fumarate-FA (PRENATAL MULTIVITAMIN) TABS Take  1 tablet by mouth daily at 12 noon.    Historical Provider, MD   BP 105/61  Pulse 82  Temp(Src) 98.3 F (36.8 C) (Oral)  Resp 18  SpO2 97%  Physical Exam  Nursing note and vitals reviewed. Constitutional: She is oriented to person, place, and time. She appears well-developed and well-nourished. No distress.  HENT:  Head: Normocephalic and atraumatic.  Eyes: Conjunctivae and EOM are normal.  Neck: Normal range of motion. No tracheal deviation present.  Cardiovascular: Normal rate.   Pulmonary/Chest: Effort normal. No respiratory distress.  Musculoskeletal: Normal range of motion.  Right foot swollen over entire dorsal surface with mild ecchymotic changes. Generalized tenderness, greatest over the medial ankle.   Neurological: She is alert and oriented to person, place, and time.  Neurovascularly intact.   Skin: Skin is warm and dry.  Psychiatric: She has a normal mood and affect. Her behavior is normal.    ED Course  Procedures (including critical care time)  DIAGNOSTIC STUDIES: Oxygen Saturation is 97% on RA, normal by my interpretation.    COORDINATION OF CARE: 12:32 PM-Discussed treatment plan which includes xray and pain medication with pt at bedside and pt agreed to plan.   Labs Review Labs Reviewed - No data to display  Imaging Review Dg Ankle Complete Right  06/06/2014   CLINICAL DATA:  Pain post fall 3 days ago  EXAM: RIGHT ANKLE -  COMPLETE 3+ VIEW  COMPARISON:  None.  FINDINGS: Three views of the right ankle submitted. There is oblique nondisplaced fracture in distal right fibula with minimal cortical step-off. Mild disruption of Ankle mortise with mild widening of medial tibiotalar space. Soft tissue swelling adjacent to lateral malleolus. Fracture at the base of third and fourth metatarsal.  IMPRESSION: There is oblique nondisplaced fracture in distal right fibula with minimal cortical step-off. Mild disruption of Ankle mortise with mild widening of medial tibiotalar  space. Soft tissue swelling adjacent to lateral malleolus. Fracture at the base of third and fourth metatarsal.   Electronically Signed   By: Natasha Mead M.D.   On: 06/06/2014 13:21   Dg Foot Complete Right  06/06/2014   CLINICAL DATA:  Pain post trauma  EXAM: RIGHT FOOT COMPLETE - 3+ VIEW  COMPARISON:  None.  FINDINGS: Frontal, oblique, and lateral views were obtained. There is a fracture of the third proximal metatarsal with displaced fracture fragments. There are nondisplaced fractures of the proximal second and fourth metatarsals. The fracture of the distal fibula is also noted on the lateral view. No other fractures are apparent. No dislocation. The joint spaces appear intact.  IMPRESSION: Displaced fracture of the proximal third metatarsal. Nondisplaced fractures of the second and fourth proximal metatarsals. Fracture distal fibula, better seen on ankle images.   Electronically Signed   By: Bretta Bang M.D.   On: 06/06/2014 13:24     EKG Interpretation None      MDM   Final diagnoses:  None    1. Multiple fractures right foot/ankle  Discussed injuries with Dr. Carola Frost who advises splint, ice elevate, non-weight bearing and office follow up. All findings discussed with patient via interpreter. All questions answered.   I personally performed the services described in this documentation, which was scribed in my presence. The recorded information has been reviewed and is accurate.  Arnoldo Hooker, PA-C 06/06/14 1411

## 2014-06-06 NOTE — ED Notes (Signed)
Pt states she slipped and turned right foot inward. Right foot and ankle are swollen and painful on palpation.

## 2014-06-07 NOTE — ED Provider Notes (Signed)
Medical screening examination/treatment/procedure(s) were conducted as a shared visit with non-physician practitioner(s) and myself.  I personally evaluated the patient during the encounter.   Pt c/o right foot and ankle pain s/p twisting injury. Tenderness prox metatarsals. Distal pulses palp. Normal cap refill in toes.   Suzi Roots, MD 06/07/14 [phone]

## 2014-10-30 ENCOUNTER — Encounter (HOSPITAL_COMMUNITY): Payer: Self-pay | Admitting: Emergency Medicine

## 2015-06-13 ENCOUNTER — Encounter: Payer: Self-pay | Admitting: *Deleted

## 2015-06-14 ENCOUNTER — Encounter: Payer: Medicaid Other | Admitting: Obstetrics & Gynecology

## 2015-07-12 ENCOUNTER — Ambulatory Visit (INDEPENDENT_AMBULATORY_CARE_PROVIDER_SITE_OTHER): Payer: Self-pay | Admitting: Obstetrics and Gynecology

## 2015-07-12 ENCOUNTER — Encounter: Payer: Self-pay | Admitting: Obstetrics and Gynecology

## 2015-07-12 VITALS — BP 106/54 | HR 80 | Temp 98.4°F | Resp 18 | Ht 64.0 in | Wt 134.2 lb

## 2015-07-12 DIAGNOSIS — R102 Pelvic and perineal pain: Principal | ICD-10-CM

## 2015-07-12 DIAGNOSIS — G8929 Other chronic pain: Secondary | ICD-10-CM

## 2015-07-12 DIAGNOSIS — N949 Unspecified condition associated with female genital organs and menstrual cycle: Secondary | ICD-10-CM

## 2015-07-12 NOTE — Progress Notes (Signed)
   Subjective:    Patient ID: Leslie Hess, female    DOB: 10/11/1981, 34 y.o.   MRN: [id number]  HPI 34 yo G3P3 referred here for the evaluation of chronic pelvic pain. Patient also reports the presence of an ovarian cyst. She reports the pain being present for the past 7 years. It has not worsen over time. She describes the pain as Deforrest Bogle with periods of exacerbation. it is located in her LLQ. She doesn't know what factors trigger worsening pain, the pain is alleviated by ibuprofen which she doesn't take often. She denies any dysmenorrhea and dyspareunia. She denies constipation, urgency, dysuria or frequency. She states the pain is unchanged after urination and a bowel movement. Patient reports ultrasound was done 3-5 months ago. She is sexually active using an IUD for contraception.  Past Medical History  Diagnosis Date  . Medical history non-contributory    Past Surgical History  Procedure Laterality Date  . No past surgeries     Family History  Problem Relation Age of Onset  . Diabetes Mother   . Diabetes Maternal Grandmother    History  Substance Use Topics  . Smoking status: Never Smoker   . Smokeless tobacco: Never Used  . Alcohol Use: No      Review of Systems See pertinent in HPI  Blood pressure 106/54, pulse 80, temperature 98.4 F (36.9 C), temperature source Oral, resp. rate 18, height 5\' 4"  (1.626 m), weight 134 lb 3.2 oz (60.873 kg), last menstrual period 07/05/2015, SpO2 98 %.     Objective:   Physical Exam  GENERAL: Well-developed, well-nourished female in no acute distress.  ABDOMEN: Soft, nontender, nondistended. No organomegaly. PELVIC: Normal external female genitalia. Vagina is pink and rugated.  Normal discharge. Normal appearing cervix. Uterus is normal in size. No adnexal mass or tenderness. EXTREMITIES: No cyanosis, clubbing, or edema, 2+ distal pulses.       Assessment & Plan:  34 yo with with chronic LLQ pain and ovarian cyst -  Ultrasound records were not available for review at the time of the visit. Since it has been more than 3 months, will order a follow up ultrasound  - Unclear etiology of the LLQ pain at this time. Continue ibuprofen prn - Patient states she will follow up in August with her PCP for physical exam including pap smear - Patient will be contacted with ultrasound results and appropriate management

## 2015-07-23 ENCOUNTER — Ambulatory Visit (HOSPITAL_COMMUNITY)
Admission: RE | Admit: 2015-07-23 | Discharge: 2015-07-23 | Disposition: A | Discharge status: Home / Self Care | Payer: Self-pay | Source: Ambulatory Visit | Attending: Obstetrics and Gynecology | Admitting: Obstetrics and Gynecology

## 2015-07-23 DIAGNOSIS — Q505 Embryonic cyst of broad ligament: Secondary | ICD-10-CM | POA: Insufficient documentation

## 2015-07-23 DIAGNOSIS — R102 Pelvic and perineal pain: Secondary | ICD-10-CM

## 2015-07-23 DIAGNOSIS — Z30431 Encounter for routine checking of intrauterine contraceptive device: Secondary | ICD-10-CM | POA: Insufficient documentation

## 2015-07-23 DIAGNOSIS — G8929 Other chronic pain: Secondary | ICD-10-CM | POA: Insufficient documentation

## 2015-07-23 DIAGNOSIS — R1032 Left lower quadrant pain: Secondary | ICD-10-CM | POA: Insufficient documentation

## 2015-07-24 ENCOUNTER — Telehealth: Payer: Self-pay | Admitting: General Practice

## 2015-07-24 NOTE — Telephone Encounter (Signed)
Called patient with pacific interpreter [phone] to inform patient of normal ultrasound results showing no gyn etiology for her pain. Patient should follow up with PCP for possible GI referral. Called patient and her husband answered stating she was at work currently. Told him we would try to contact her again later.

## 2015-07-26 NOTE — Telephone Encounter (Signed)
Called pt with Spanish interpreter, Leslie Hess, and pt brother picked up phone and stated that she was not at home.  I asked if there was another # to contact her.  He gave me # [phone].  Per [name], pts brother stated that the number was [phone].  Called [phone] and LM to return call to the clinics.

## 2015-07-27 NOTE — Telephone Encounter (Signed)
Received message left on nurse line on 07/27/15 at 0840.  Pateint requests a return call.  Leslie Hess helped with translating the message.  Contacted patient via phone with [name]'s help.  U/S results given.  Encouraged patient to see her Primary Care provider for follow up as there is no GYN reason for her pain.  Explained she may require referral to a GI doctor.  Patient states understanding.

## 2015-10-31 ENCOUNTER — Encounter: Payer: Self-pay | Admitting: *Deleted

## 2016-10-17 ENCOUNTER — Other Ambulatory Visit (HOSPITAL_COMMUNITY): Payer: Self-pay | Admitting: *Deleted

## 2016-10-17 DIAGNOSIS — N644 Mastodynia: Secondary | ICD-10-CM

## 2016-10-30 ENCOUNTER — Encounter (HOSPITAL_COMMUNITY): Payer: Self-pay

## 2016-10-30 ENCOUNTER — Other Ambulatory Visit (HOSPITAL_COMMUNITY): Payer: Self-pay | Admitting: *Deleted

## 2016-10-30 ENCOUNTER — Ambulatory Visit
Admission: RE | Admit: 2016-10-30 | Discharge: 2016-10-30 | Disposition: A | Discharge status: Home / Self Care | Payer: No Typology Code available for payment source | Source: Ambulatory Visit | Attending: Obstetrics and Gynecology | Admitting: Obstetrics and Gynecology

## 2016-10-30 ENCOUNTER — Ambulatory Visit (HOSPITAL_COMMUNITY)
Admission: RE | Admit: 2016-10-30 | Discharge: 2016-10-30 | Disposition: A | Discharge status: Home / Self Care | Payer: Self-pay | Source: Ambulatory Visit | Attending: Obstetrics and Gynecology | Admitting: Obstetrics and Gynecology

## 2016-10-30 VITALS — BP 118/80 | Temp 98.5°F | Ht 60.0 in | Wt 144.8 lb

## 2016-10-30 DIAGNOSIS — N644 Mastodynia: Secondary | ICD-10-CM

## 2016-10-30 DIAGNOSIS — Z1239 Encounter for other screening for malignant neoplasm of breast: Secondary | ICD-10-CM

## 2016-10-30 NOTE — Patient Instructions (Signed)
Explained breast self awareness to Leslie Hess. Patient did not need a Pap smear today due to last Pap smear was 07/28/2014. Let her know BCCCP will cover Pap smears every 3 years unless has a history of abnormal Pap smears. [facility] for diagnostic mamm[facility] and possible right breast ultrasound. Appointment scheduled for Thursday, October 30, 2016 at 1440. Leslie Hess verbalized understanding.  Serigne Kubicek, Kathaleen Maserhristine Poll, RN 3:33 PM

## 2016-10-30 NOTE — Progress Notes (Signed)
Complaints of constant right outer breast pain x 2 months. Patient rates pain at a 5 out of 10.  Pap Smear: Pap smear not completed today. [facility] and normal. Per patient has no history of an abnormal Pap smear. Last Pap smear result is in EPIC.  Physical exam: Breasts Breasts symmetrical. No skin abnormalities bilateral breasts. No nipple retraction bilateral breasts. No nipple discharge bilateral breasts. No lymphadenopathy. No lumps palpated bilateral breasts. Complaints of right outer breast tenderness on exam. [facility] for diagnostic mammogram and possible right breast ultrasound. Appointment scheduled for Thursday, October 30, 2016 at 1440.        Pelvic/Bimanual No Pap smear completed today since last Pap smear was 07/28/2014. Pap smear not indicated per BCCCP guidelines.   Smoking History: Patient has never smoked.  Patient Navigation: Patient education provided. [facility] services [facility]. Spanish interpreter provided.  Used Spanish interpreter Darletta Mollorita Arias from [employer].

## 2016-11-03 ENCOUNTER — Encounter (HOSPITAL_COMMUNITY): Payer: Self-pay | Admitting: *Deleted

## 2017-03-27 ENCOUNTER — Ambulatory Visit (INDEPENDENT_AMBULATORY_CARE_PROVIDER_SITE_OTHER): Payer: Worker's Compensation

## 2017-03-27 ENCOUNTER — Ambulatory Visit: Payer: Worker's Compensation | Admitting: Physician Assistant

## 2017-03-27 VITALS — BP 119/75 | HR 70 | Temp 98.8°F | Resp 18 | Ht 60.5 in | Wt 142.5 lb

## 2017-03-27 DIAGNOSIS — M545 Low back pain: Secondary | ICD-10-CM

## 2017-03-27 DIAGNOSIS — S39012A Strain of muscle, fascia and tendon of lower back, initial encounter: Secondary | ICD-10-CM

## 2017-03-27 IMAGING — DX DG LUMBAR SPINE COMPLETE 4+V
5 series · 5 of 5 positions shown · non-contrast
Comparison: None.

CLINICAL DATA: Low back pain following bending, initial encounter

EXAM:
LUMBAR SPINE - COMPLETE 4+ VIEW

[l-spine ap]
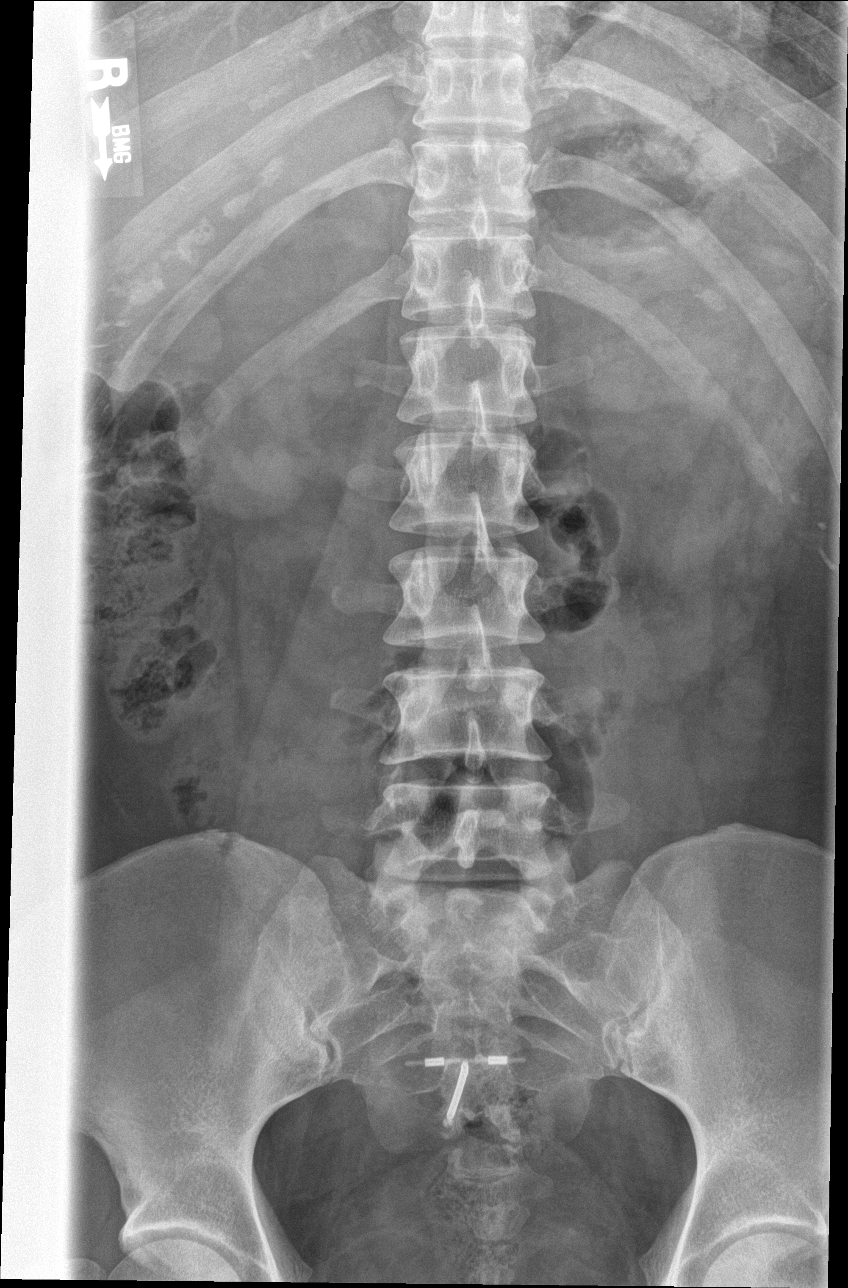

[l-spine obl (1 of 2)]
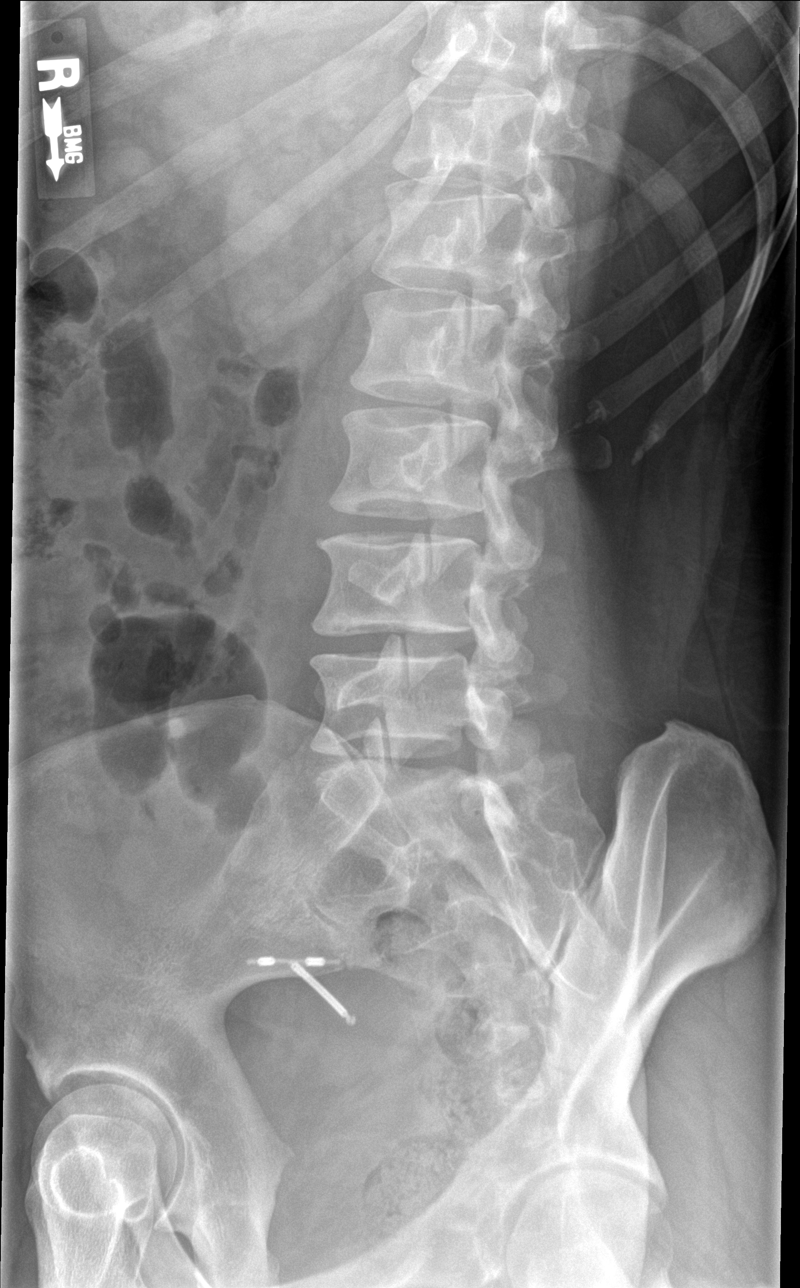

[l-spine obl (2 of 2)]
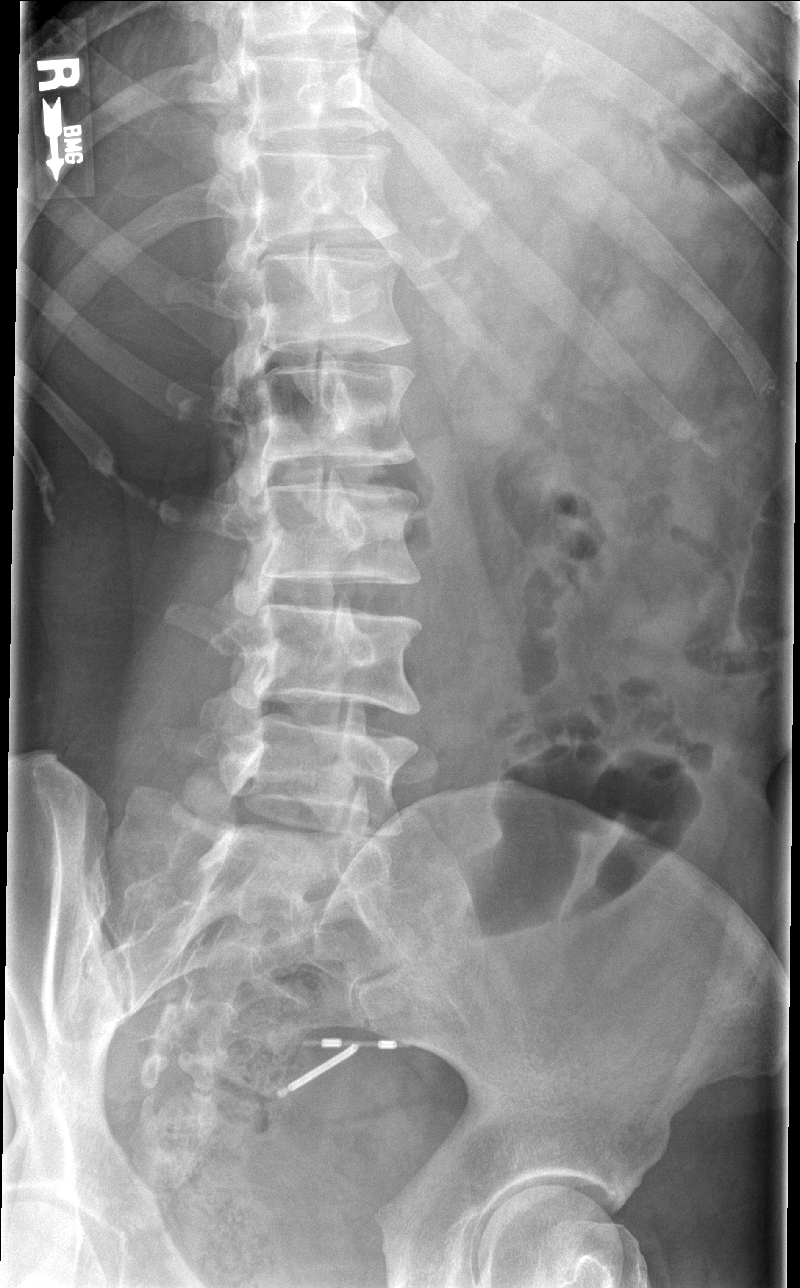

[l-spine lat]
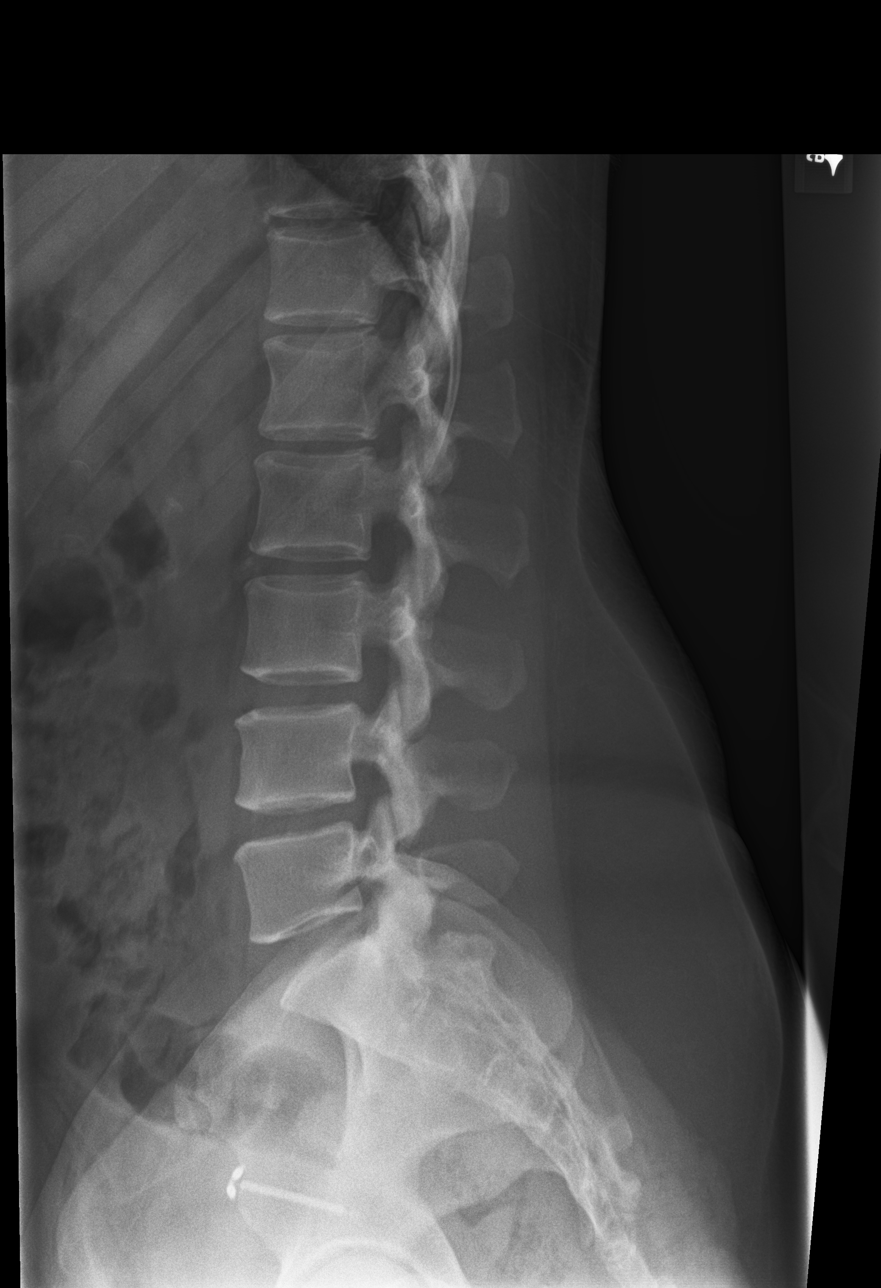

[l-spine l5-s1]
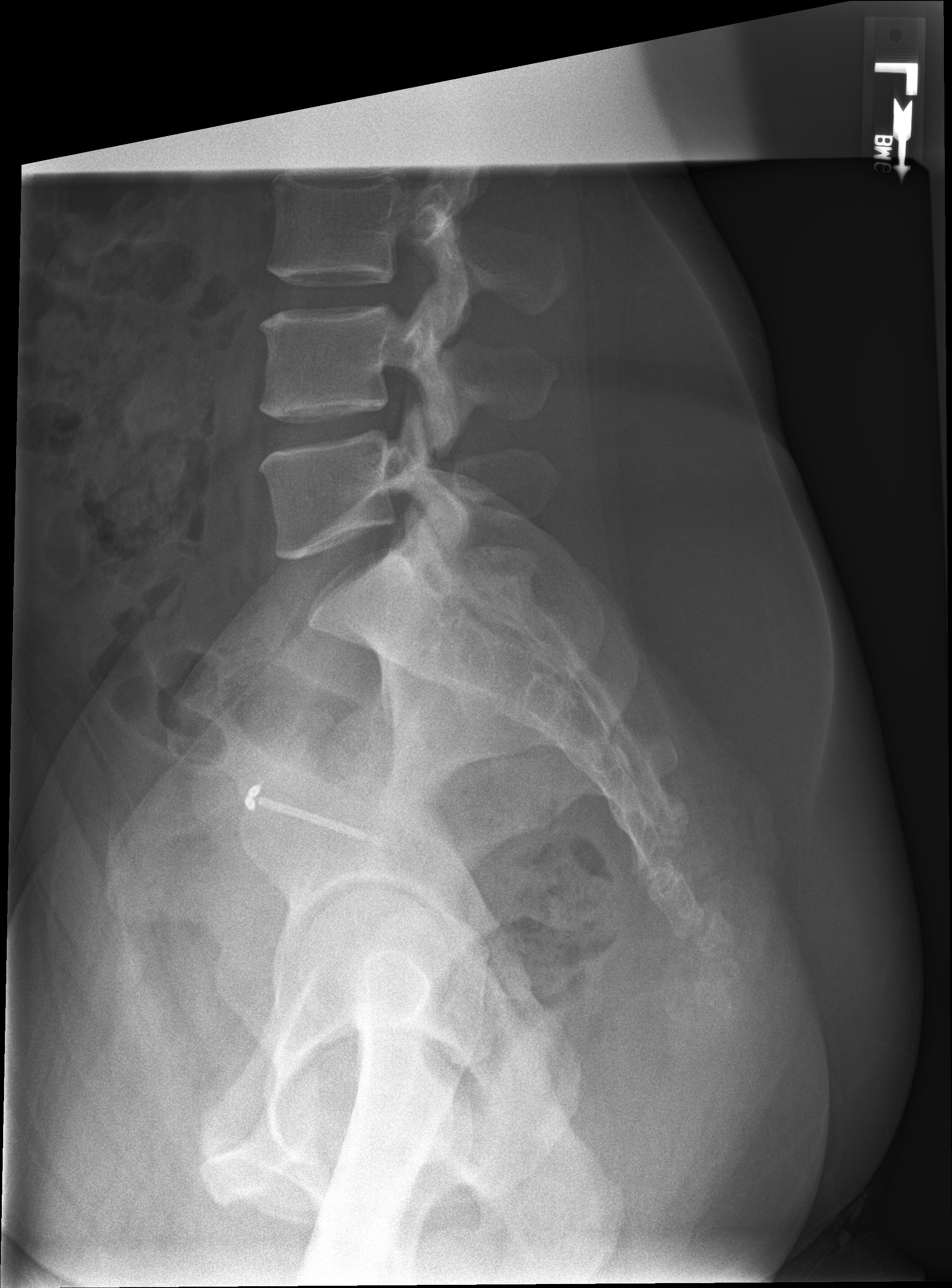

[5 of 5 positions shown; findings below may reference images not displayed]

FINDINGS: Five lumbar type vertebral bodies are well visualized. Vertebral
body height is well maintained. No pars defects are seen. No
anterolisthesis is noted. Mild L4-5 disc space narrowing is noted.
No soft tissue abnormality is seen. An IUD is noted in place.
IMPRESSION: Minimal disc space narrowing.  No acute abnormality noted.

## 2017-03-27 IMAGING — DX DG SACRUM/COCCYX 2+V
3 series · 3 of 3 positions shown · non-contrast
Comparison: None.

CLINICAL DATA: Acute bilateral low back pain with sciatica, sacral
pain

EXAM:
SACRUM AND COCCYX - 2+ VIEW

[coccyx ap]
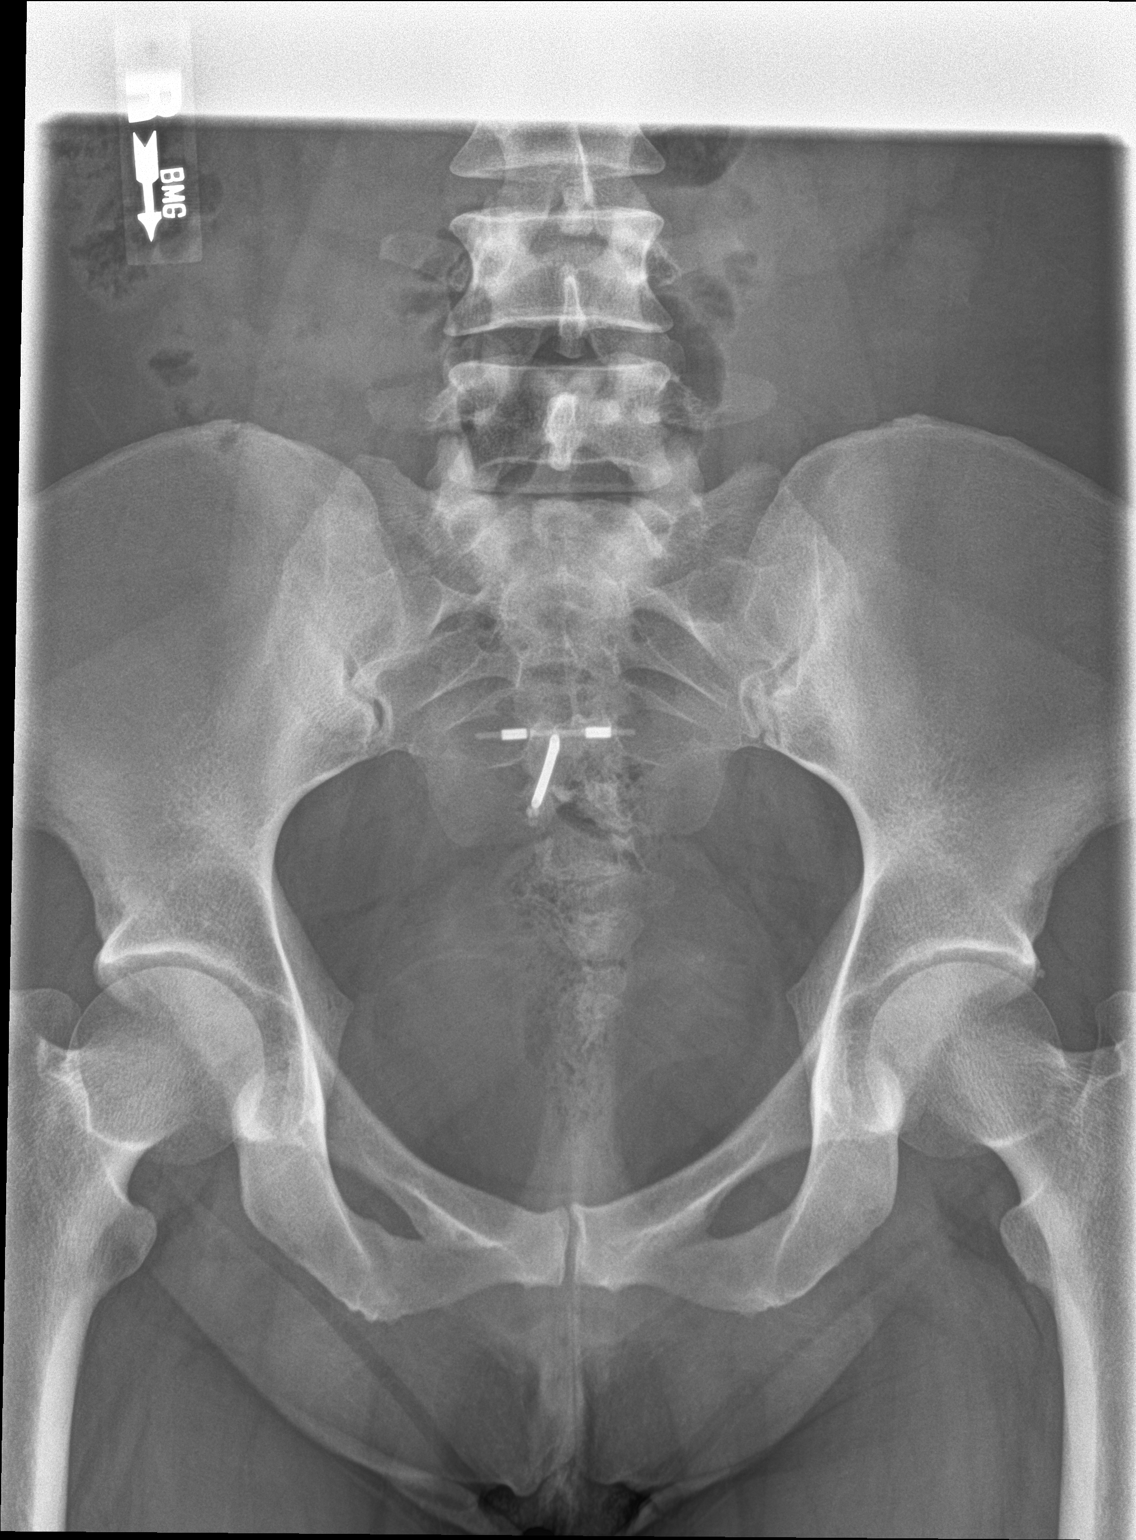

[sacrum ap]
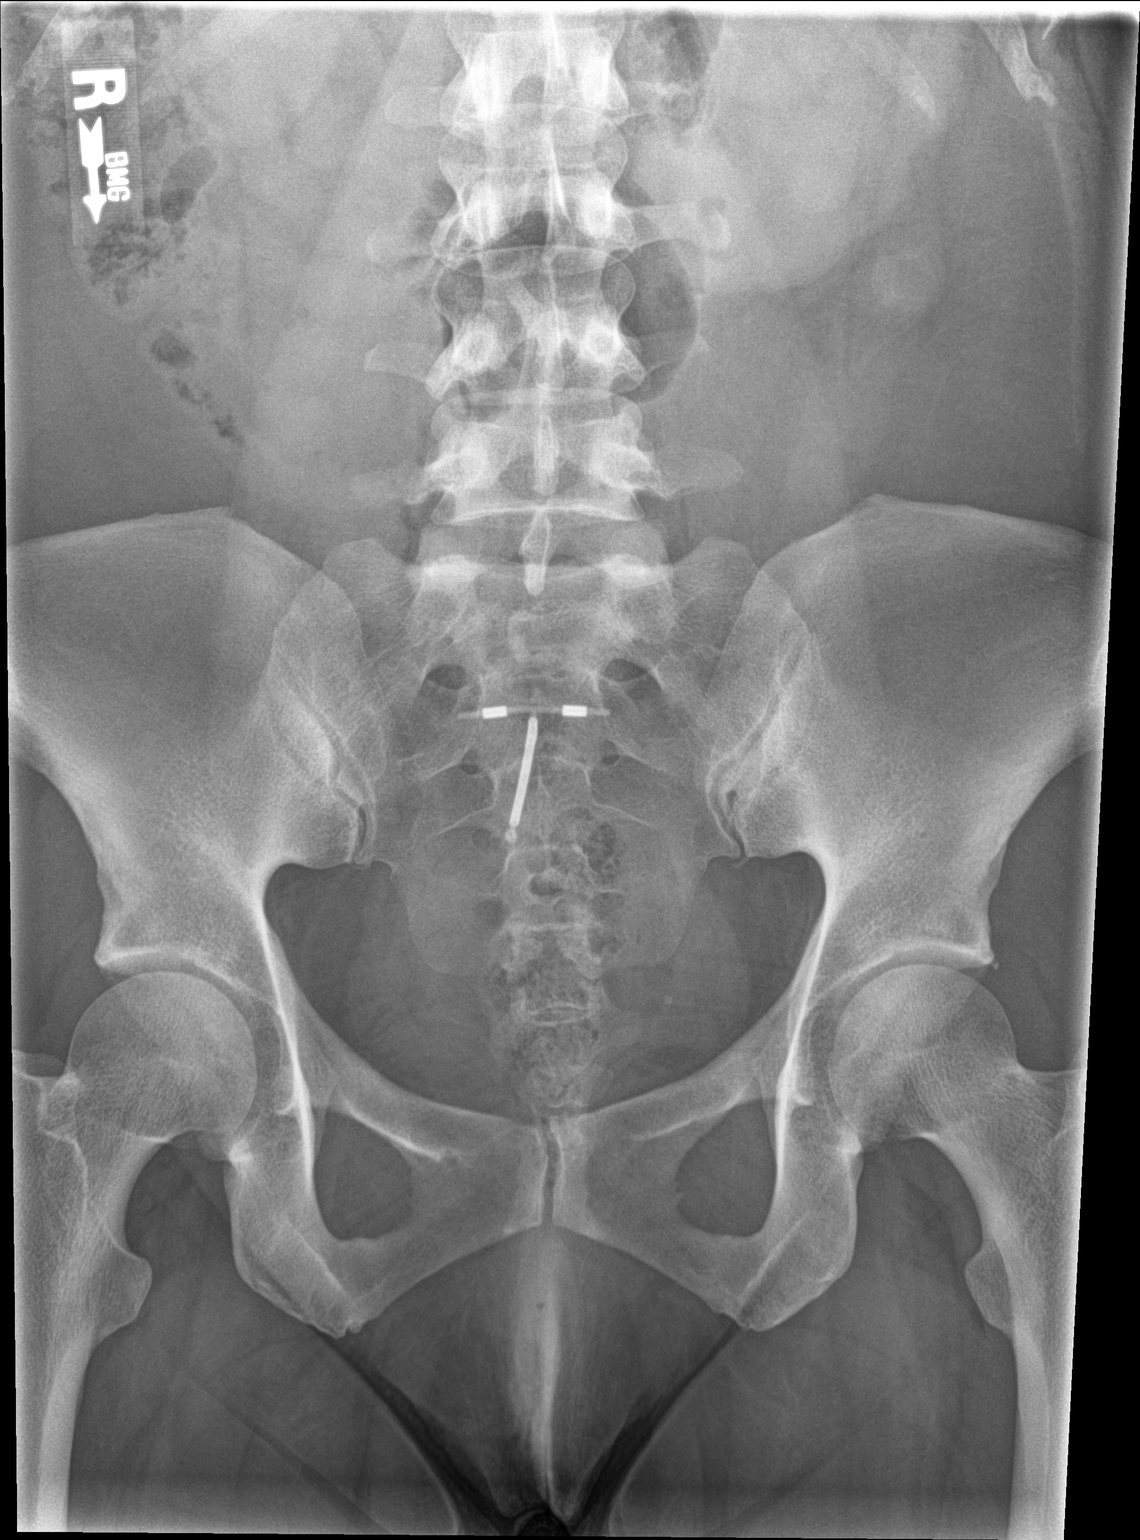

[sacrum lat]
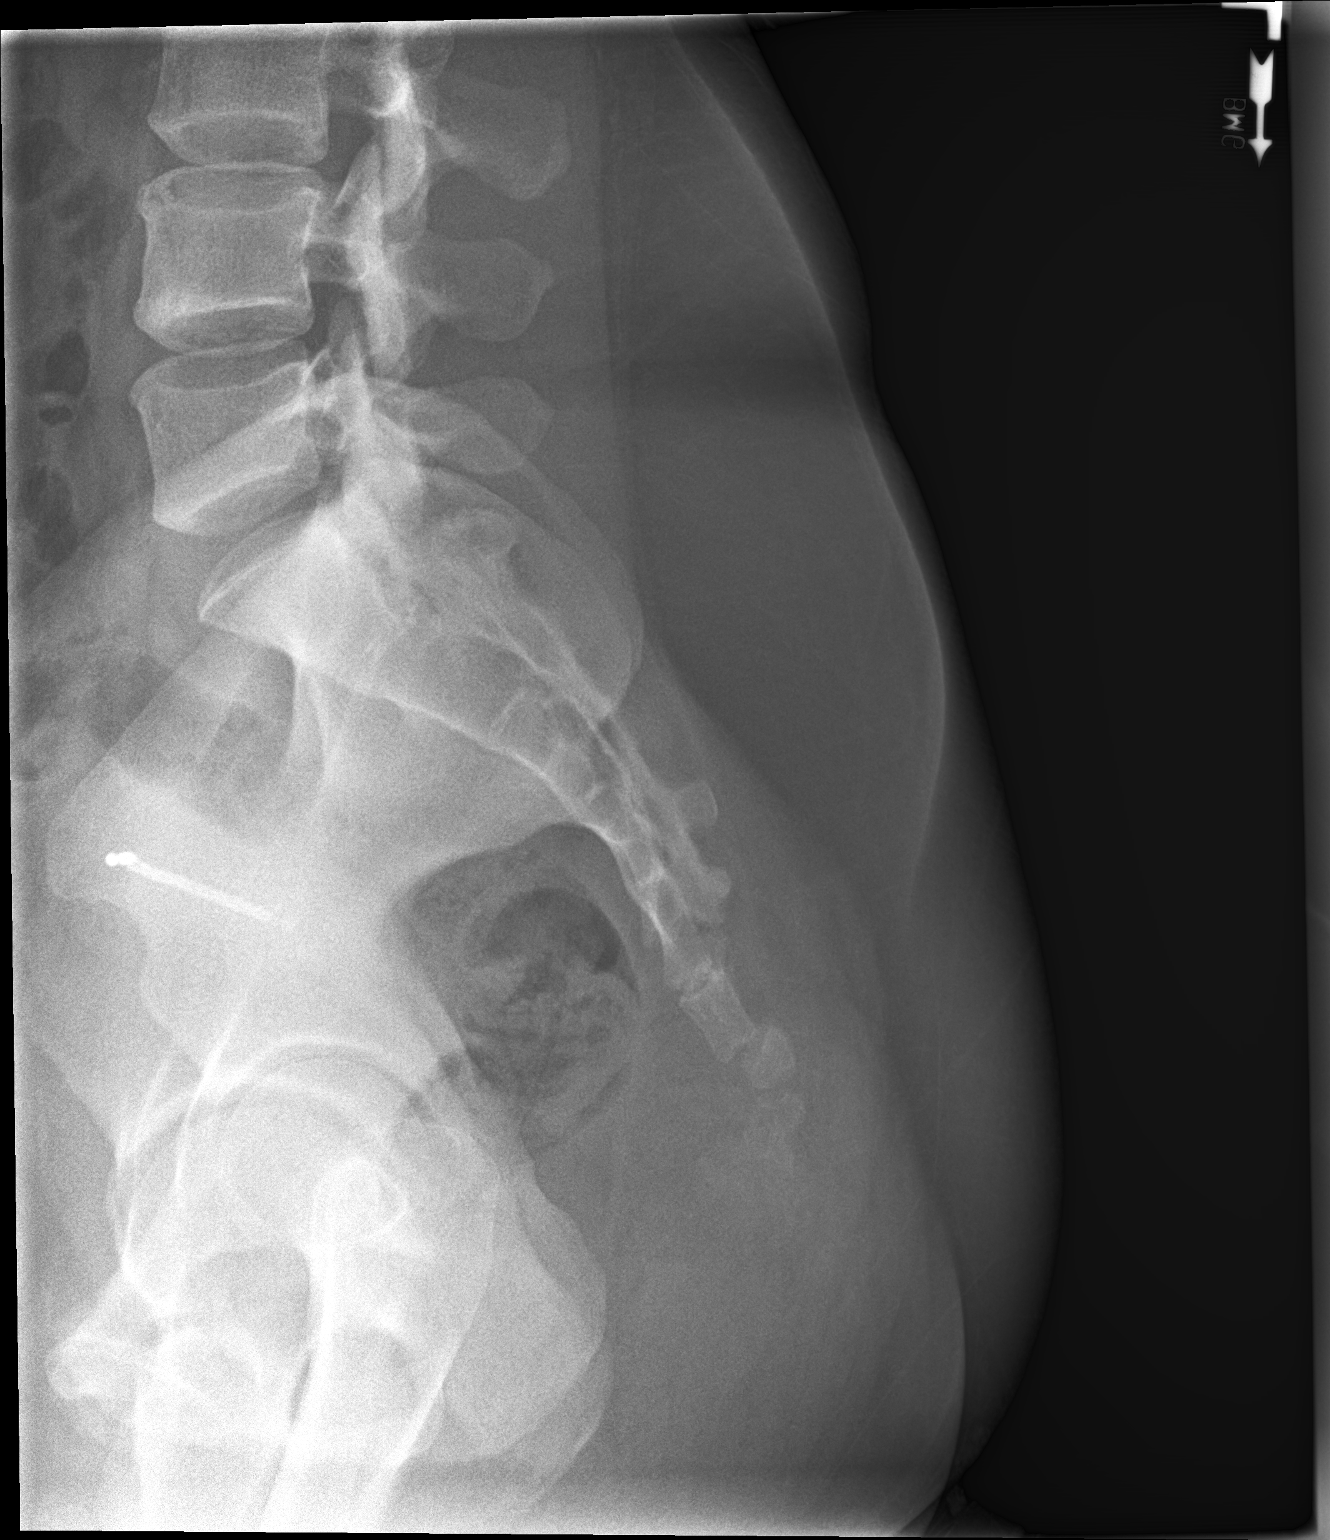

[3 of 3 positions shown; findings below may reference images not displayed]

FINDINGS: There is no evidence of fracture or other focal bone lesions. IUD is
noted midline pelvis.
IMPRESSION: Negative.

## 2017-03-27 MED ORDER — CYCLOBENZAPRINE HCL 10 MG PO TABS: 5.0000 mg | ORAL_TABLET | Freq: Three times a day (TID) | ORAL | 0 refills | Status: DC | PRN

## 2017-03-27 MED ORDER — TRAMADOL HCL 50 MG PO TABS: 50.0000 mg | ORAL_TABLET | Freq: Three times a day (TID) | ORAL | 0 refills | Status: DC | PRN

## 2017-03-27 MED ORDER — PREDNISONE 20 MG PO TABS: ORAL_TABLET | ORAL | 0 refills | Status: DC

## 2017-03-27 NOTE — Progress Notes (Signed)
PRIMARY CARE AT [facility] [address] [place] [address] [phone]  Date:  03/27/2017   Name:  Leslie Hess   DOB:  May 20, 1981   MRN:  [id number]  PCP:  No PCP Per Patient    History of Present Illness:  Leslie Hess is a 36 y.o. female patient who presents to PCP with  Chief Complaint  Patient presents with  . Back Pain    C/O lower back pain after heavy lifting at work yesterday     Patient has pain at her lower back pain.  Stabbing pain.  Radiates to her left buttocks.  There is no weakness or instability.  No swelling that she has noticed.  She was placing a tray with a pitcher down, when the pain started.  She was able to complete her shift, but states the pain progressively worsened.      There are no active problems to display for this patient.   Past Medical History:  Diagnosis Date  . Medical history non-contributory     Past Surgical History:  Procedure Laterality Date  . NO PAST SURGERIES      Social History  Substance Use Topics  . Smoking status: Never Smoker  . Smokeless tobacco: Never Used  . Alcohol use No    Family History  Problem Relation Age of Onset  . Diabetes Mother   . Hypertension Mother   . Diabetes Maternal Grandmother     Allergies  Allergen Reactions  . Fish Allergy Itching    Blisters on skin    Medication list has been reviewed and updated.  Current Outpatient Prescriptions on File Prior to Visit  Medication Sig Dispense Refill  . Prenatal Vit-Fe Fumarate-FA (PRENATAL MULTIVITAMIN) TABS Take 1 tablet by mouth daily at 12 noon.    Marland Kitchen ibuprofen (ADVIL,MOTRIN) 600 MG tablet Take 1 tablet (600 mg total) by mouth every 6 (six) hours as needed for pain. (Patient not taking: Reported on 10/30/2016) 30 tablet 0   No current facility-administered medications on file prior to visit.     ROS ROS otherwise unremarkable unless listed above.  Physical Examination: BP 119/75   Pulse 70   Temp 98.8 F (37.1  C) (Oral)   Resp 18   Ht 5' 0.5" (1.537 m)   Wt 142 lb 8 oz (64.6 kg)   LMP 03/22/2017 (Approximate)   SpO2 98%   BMI 27.37 kg/m  Ideal Body Weight: Weight in (lb) to have BMI = 25: 129.9  Physical Exam  Constitutional: She is oriented to person, place, and time. She appears well-developed and well-nourished. No distress.  HENT:  Head: Normocephalic and atraumatic.  Right Ear: External ear normal.  Left Ear: External ear normal.  Eyes: Conjunctivae and EOM are normal. Pupils are equal, round, and reactive to light.  Cardiovascular: Normal rate and regular rhythm.  Exam reveals no friction rub.   No murmur heard. Pulmonary/Chest: Effort normal. No respiratory distress. She has no wheezes.  Musculoskeletal:       Lumbar back: She exhibits bony tenderness (low lumbar tenderness to sacrum.  adjacent musculature tender as well.). She exhibits normal range of motion and no swelling.  Positive straight leg raise test. Flexion is about 30 degrees at this time.  Neurological: She is alert and oriented to person, place, and time.  Skin: She is not diaphoretic.  Psychiatric: She has a normal mood and affect. Her behavior is normal.    Dg Lumbar Spine Complete  Result Date: 03/27/2017 CLINICAL DATA:  Low back pain following bending, initial encounter EXAM: LUMBAR SPINE - COMPLETE 4+ VIEW COMPARISON:  None. FINDINGS: Five lumbar type vertebral bodies are well visualized. Vertebral body height is well maintained. No pars defects are seen. No anterolisthesis is noted. Mild L4-5 disc space narrowing is noted. No soft tissue abnormality is seen. An IUD is noted in place. IMPRESSION: Minimal disc space narrowing.  No acute abnormality noted. Electronically Signed   By: Alcide Clever M.D.   On: 03/27/2017 09:41   Dg Sacrum/coccyx  Result Date: 03/27/2017 CLINICAL DATA:  Acute bilateral low back pain with sciatica, sacral pain EXAM: SACRUM AND COCCYX - 2+ VIEW COMPARISON:  None. FINDINGS: There is no  evidence of fracture or other focal bone lesions. IUD is noted midline pelvis. IMPRESSION: Negative. Electronically Signed   By: Natasha Mead M.D.   On: 03/27/2017 09:41    Assessment and Plan: Leslie Hess is a 36 y.o. female who is here today for low back pain Muscle strain, possible sciatica. Prednisone taper at this time. Advised sedation of the flexeril.   rtc as needed. Acute bilateral low back pain, with sciatica presence unspecified - Plan: DG Sacrum/Coccyx, DG Lumbar Spine Complete, predniSONE (DELTASONE) 20 MG tablet, cyclobenzaprine (FLEXERIL) 10 MG tablet, traMADol (ULTRAM) 50 MG tablet  Strain of muscle, fascia and tendon of lower back, initial encounter - Plan: predniSONE (DELTASONE) 20 MG tablet, cyclobenzaprine (FLEXERIL) 10 MG tablet, traMADol (ULTRAM) 50 MG tablet  Trena Platt, PA-C [facility] 3/31/201811:10 PM

## 2017-03-27 NOTE — Patient Instructions (Addendum)
IF you received an x-ray today, you will receive an invoice from [facility]. Please contact [facility] at [phone] with questions or concerns regarding your invoice.   IF you received labwork today, you will receive an invoice from [facility]. Please contact [facility] at [phone] with questions or concerns regarding your invoice.   Our billing staff will not be able to assist you with questions regarding bills from these companies.  You will be contacted with the lab results as soon as they are available. The fastest way to get your results is to activate your My Chart account. Instructions are located on the last page of this paperwork. If you have not heard from [place] regarding the results in 2 weeks, please contact this office.      Low Back Sprain With Rehab Low Back Sprain Rehab Consulte al mdico qu ejercicios son seguros para usted. Haga los ejercicios exactamente como se lo haya indicado el mdico y gradelos como se lo hayan indicado. Es normal sentir un leve estiramiento, tirn, rigidez o molestia cuando haga estos ejercicios, pero debe detenerse de inmediato si siento un dolor repentino o si el dolor empeora. No comience a hacer estos ejercicios hasta que se lo indique el mdico. EJERCICIOS DE [employer] Y AMPLITUD DE MOVIMIENTOS Estos ejercicios calientan los msculos y las articulaciones, y mejoran el movimiento y la flexibilidad de la espalda. Estos ejercicios tambin ayudan a Engineer, materials, el adormecimiento y el hormigueo. Ejercicio A: Rotacin lumbar 1. Acustese boca arriba sobre un superficie firme y flexione las rodillas. 2. Coloque los brazos extendidos a los lados de modo que cada brazo forme una "L" con el cuerpo (un ngulo de 90 grados). 3. Mueva lentamente ambas rodillas hacia un lado del cuerpo hasta que sienta un estiramiento en la parte inferior de la espalda. Trate de no despegar los hombros del piso. 4. Mantenga esta posicin durante  __________ segundos. 5. Tensione los msculos abdominales y lentamente lleve las rodillas a la posicin inicial. 6. Repita este ejercicio del otro lado del cuerpo. Repita __________ veces. Realice este ejercicio __________ veces al da. Ejercicio B: Extensin [employer] codos, en decbito prono 1. Acustese boca abajo sobre una superficie firme. 2. Apyese sobre los codos. 3. Con los brazos, aydese a Haematologist sentir un leve estiramiento en el abdomen y la parte inferior de la espalda.  Esto colocar algo de [employer] codos. Si no se siente cmodo, intente colocando almohadas debajo del pecho.  Debe dejar la cadera inmvil sobre la superficie en la que est apoyado. Mantenga la cadera y los msculos de la espalda relajados. 4. Mantenga esta posicin durante __________ segundos. 5. Afloje lentamente la parte superior del cuerpo y vuelva a la posicin inicial. Repita __________ veces. Realice este ejercicio __________ veces al da. EJERCICIOS DE FORTALECIMIENTO Estos ejercicios fortalecen la espalda y le otorgan resistencia. La resistencia es la capacidad de usar los msculos durante un tiempo prolongado, incluso despus de que se cansen. Ejercicio C: Inclinacin de la pelvis 1. Acustese boca arriba sobre una superficie firme. Flexione las rodillas y [employer]. 2. Tensione los msculos abdominales. Eleve la pelvis hacia el techo y aplane la parte inferior de la espalda contra el suelo.  Para realizar este ejercicio, puede colocar una toalla pequea debajo de la parte inferior de la espalda y presionar la espalda contra la toalla. 3. Mantenga esta posicin durante __________ segundos. 4. Relaje totalmente los msculos antes de repetir el  ejercicio. Repita __________ veces. Realice este ejercicio __________ veces al da. Ejercicio D: Elevaciones alternadas de pierna y brazo 1. Apoye las palmas de las manos y las rodillas sobre una superficie firme. Si se  colocar sobre una superficie muy dura, puede usar un elemento acolchado para apoyar las rodillas, como una alfombrilla para ejercicios. 2. Alinee los brazos y las piernas. Las manos deben estar debajo de los hombros y las rodillas debajo de la cadera. 3. Eleve la pierna izquierda hacia atrs. Al mismo tiempo, eleve el brazo derecho y Associate Professor frente a usted.  No eleve la pierna por encima de la cadera.  No eleve el brazo por encima del hombro.  Mantenga los msculos del abdomen y de la espalda contrados.  Mantenga la cadera mirando hacia el suelo.  No arquee la espalda.  Mantenga el equilibrio con cuidado y no contenga la respiracin. 4. Mantenga esta posicin durante __________ segundos. 5. Lentamente regrese a la posicin inicial y repita el ejercicio con la pierna derecha y el brazo izquierdo. Repita __________ veces. Realice este ejercicio __________ veces al da. Ejercicio E: Serie de abdominales con levantamiento de pierna extendida 1. Acustese boca arriba sobre una superficie firme. 2. Flexione una rodilla y [place] la otra pierna extendida. 3. Tensione los msculos abdominales y levante la pierna extendida, a unas 4 o 6 pulgadas (10 o 15 cm) del suelo. 4. Mantenga apretados los msculos abdominales y sostenga durante __________ segundos.  No contenga la respiracin.  No arquee la espalda. Mantngala plana contra el suelo. 5. Mantenga tensos los msculos abdominales mientras baja lentamente la pierna hasta la posicin inicial. 6. Repita el ejercicio con la otra pierna. Repita __________ veces. Realice este ejercicio __________ veces al da. Tyson Dense Y [employer] La Water quality scientist se refiere a los movimientos y a las posiciones del cuerpo mientras realiza las actividades diarias. La postura es una parte de la Water quality scientist. La buena postura y la Administrator, sports corporal saludable pueden ayudar a Acupuncturist estrs en las articulaciones y los tejidos del cuerpo. La buena  postura significa que la columna mantiene su posicin natural de curvatura en forma de S (la columna est en una posicin neutral), los hombros Zenaida Niece un poco hacia atrs y la cabeza no se inclina hacia adelante. A continuacin, se incluyen pautas generales para mejorar la postura y Regulatory affairs officer en las actividades diarias. De pie  Al estar de pie, mantenga la columna en la posicin neutral y los pies separados al ancho de caderas, aproximadamente. Mantenga las rodillas ligeramente flexionadas. Las [place], los hombros y las caderas deben estar alineados.  Cuando realice una tarea en la que deba estar de pie en el mismo sitio durante mucho tiempo, coloque un pie en un objeto estable de 2 a 4 pulgadas (5 a 10 cm) de alto, como un taburete. Esto ayuda a que la columna mantenga una posicin neutral. Sentado  Cuando est sentado, mantenga la columna en posicin neutral y deje los pies apoyados en el suelo. Use un apoyapis, si es necesario, y [employer]Ex muslos paralelos al suelo. Evite redondear los hombros e inclinar la cabeza hacia adelante.  Cuando trabaje en un escritorio o con una computadora, el escritorio debe estar a una altura en la que las manos estn un poco ms abajo que los codos. Deslice la silla debajo del escritorio, de modo de estar lo suficientemente cerca como para mantener una buena Del Muerto.  Cuando trabaje con una computadora, coloque el monitor a una altura que  le permita mirar derecho hacia adelante, sin tener que inclinar la cabeza hacia adelante o hacia atrs. Reposo Al descansar o estar acostado, evite las posiciones que le causen ms dolor.  Si siente dolor al hacer actividades que exigen sentarse, inclinarse, agacharse o ponerse en cuclillas (actividades basadas en la flexin), acustese en una posicin en la que el cuerpo no deba doblarse mucho. Por ejemplo, evite acurrucarse de costado con los brazos y las rodillas cerca del pecho (posicin fetal).  Si siente dolor  con las actividades que exigen estar de pie durante mucho tiempo o Furniture conservator/restorer los brazos (actividades basadas en la extensin), acustese con la columna en una posicin neutral y flexione ligeramente las rodillas. Pruebe con las siguientes posiciones:  Acostarse de costado con una almohada entre las rodillas.  Acostarse boca arriba con una almohada debajo de las rodillas. Levantar objetos  Cuando tenga que levantar un objeto, mantenga los pies separados el ancho de los hombros y apriete los msculos abdominales.  Flexione las rodillas y la cadera, y Dietitian la columna en posicin neutral. Es importante levantar utilizando la fuerza de las piernas, no de la espalda. No trabe las rodillas hacia afuera.  Siempre pida ayuda a otra persona para levantar objetos pesados o incmodos. Esta informacin no tiene Theme park manager el consejo del mdico. Asegrese de hacerle al mdico cualquier pregunta que tenga. Document Released: 10/01/2006 Document Revised: 05/01/2015 Document Reviewed: 09/26/2015 Elsevier Interactive Patient Education  2017 [employer].

## 2017-03-30 ENCOUNTER — Telehealth: Payer: Self-pay | Admitting: Physician Assistant

## 2017-03-30 ENCOUNTER — Ambulatory Visit: Payer: Worker's Compensation | Admitting: Urgent Care

## 2017-03-30 VITALS — BP 118/79 | HR 98 | Temp 98.6°F | Resp 18 | Ht 60.5 in | Wt 147.4 lb

## 2017-03-30 DIAGNOSIS — S39012D Strain of muscle, fascia and tendon of lower back, subsequent encounter: Secondary | ICD-10-CM

## 2017-03-30 DIAGNOSIS — K117 Disturbances of salivary secretion: Secondary | ICD-10-CM

## 2017-03-30 DIAGNOSIS — M545 Low back pain, unspecified: Secondary | ICD-10-CM

## 2017-03-30 DIAGNOSIS — R682 Dry mouth, unspecified: Secondary | ICD-10-CM

## 2017-03-30 NOTE — Progress Notes (Signed)
  MRN: [id number] DOB: 1981-02-27  Subjective:   Leslie Hess is a 36 y.o. female presenting for chief complaint of Medication Problem (pt says her mouth is dry due to the medication she got here on 3/30 which is cyclobenzapr, tramadol, prednisone)  Reports having dry and cotton mouth since starting Tramadol. Denies fever, rash, tongue swelling, cough, chest tightness, n/v, abdominal pain. Has also been using Flexeril, started prednisone Saturday but had side effect before then. She also notes improvement in her back pain.   Leslie Hess has a current medication list which includes the following prescription(s): cyclobenzaprine, ibuprofen, prednisone, prenatal multivitamin, and tramadol. Also is allergic to fish allergy.  Leslie Hess  has a past medical history of Medical history non-contributory. Also  has a past surgical history that includes No past surgeries.  Objective:   Vitals: BP 118/79   Pulse 98   Temp 98.6 F (37 C) (Oral)   Resp 18   Ht 5' 0.5" (1.537 m)   Wt 147 lb 6.4 oz (66.9 kg)   LMP 03/22/2017 (Approximate)   SpO2 97%   BMI 28.31 kg/m   Physical Exam  Constitutional: She is oriented to person, place, and time. She appears well-developed and well-nourished.  HENT:  Mucous membranes dry.  Cardiovascular: Normal rate.   Pulmonary/Chest: Effort normal.  Neurological: She is alert and oriented to person, place, and time.  Skin: Skin is warm and dry. No rash noted.   Assessment and Plan :   1. Xerostomia - Stop Tramadol, switch to APAP and see below.   2. Acute midline low back pain without sciatica 3. Lumbar strain, subsequent encounter - Improving, continue/finish steroid course.   Wallis Bamberg, PA-C Primary Care at [facility] [phone] 03/30/2017  3:06 PM

## 2017-03-30 NOTE — Patient Instructions (Addendum)
Tome  de Tylenol con ibuprofen 400-600mg  cada 6 horas con comida para dolor y inflammacion.     Distensin lumbar con rehabilitacin (Low Back Strain With Rehab) Una distensin es un estiramiento o un desgarro en un msculo o los fuertes cordones de tejido que adhieren el msculo al hueso (tendones). Las distensiones en la parte inferior de la espalda (columna lumbar) son Neomia Dear causa frecuente de dolor lumbar. Una distensin ocurre cuando los msculos o tendones se desgarran o se estiran ms all de su lmite. Los msculos se pueden inflamar y, por lo tanto, Development worker, international aid dolor y Engineer, materials repentina del msculo (espasmos). Una distensin puede ocurrir de repente debido a una lesin (traumatismo) o se Airline pilot gradualmente debido al [employer]. Hay tres tipos de distensiones:  El grado1 es una distensin leve que se caracteriza por un Hydrologist de las fibras o tendones musculares. Esto puede provocar un poco de dolor, pero no hay prdida de fuerza muscular.  El grado2 es una distensin moderada producida por un desgarro parcial de las fibras o tendones musculares. Esto provoca un dolor ms intenso y cierta prdida de fuerza muscular.  El grado3 es una distensin grave e implica la ruptura completa del msculo o del tendn. Esto causa un dolor intenso y la prdida completa o casi completa de la fuerza muscular. CAUSAS Esta afeccin puede ser causada por lo siguiente:  Traumatismo, debido, por ejemplo, a una cada o a un golpe en el cuerpo.  Torsin o hiperdistensin de la espalda. Esto puede ser el resultado de realizar actividades que requieren mucha energa, como levantar objetos pesados. FACTORES DE RIESGO Los siguientes factores pueden aumentar el riesgo de sufrir esta afeccin:  Education administrator deportes de contacto.  Participar en deportes o actividades que sobrecargan la espalda e implican mucha flexin y torsin, por ejemplo:  Levantar pesas u objetos  pesados.  Gimnasia.  Ftbol.  Patinaje artstico.  Snowboard.  Tener exceso de [place] u obesidad.  Tener poca fuerza y flexibilidad. SNTOMAS Los sntomas de esta afeccin pueden incluir los siguientes:  Dolor agudo o sordo en la parte inferior de la espalda que no desaparece. El dolor se puede extender [employer].  Rigidez.  Amplitud de movimientos limitada.  Incapacidad para pararse derecho debido a la rigidez o al [employer].  Espasmos musculares. DIAGNSTICO Esta afeccin se puede diagnosticar en funcin de lo siguiente:  Sus sntomas.  Sus antecedentes mdicos.  Un examen fsico.  El mdico puede presionar sobre ciertas zonas de la espalda para determinar el origen de su dolor.  Le puede pedir que se incline Kellogg, Tilden atrs y de un lado al otro para Development worker, community la intensidad del dolor y la amplitud de Maria Stein.  Pruebas de diagnstico por imgenes, como:  Radiografas.  Resonancia magntica (RM). TRATAMIENTO El tratamiento de esta afeccin puede incluir lo siguiente:  Corporate treasurer y fro en la zona afectada.  Tomar medicamentos para Engineer, materials y International aid/development worker los msculos (relajantes musculares).  Tomar antiinflamatorios no esteroides Murriel Hopper) para ayudar a Building services engineer y las molestias.  Realizar fisioterapia. Cuando los sntomas mejoran, es importante volver a su rutina habitual tan pronto como sea posible para reducir Chief Technology Officer, y Automotive engineer la rigidez y la prdida de fuerza muscular. En general, los sntomas deberan mejorar en 6semanas de tratamiento. Sin embargo, el tiempo de recuperacin vara. INSTRUCCIONES PARA EL CUIDADO EN EL HOGAR Control del dolor, de la rigidez y de la hinchazn  Si se lo indic el mdico, aplique hielo  en la zona afectada durante las primeras 24horas despus de la lesin.  Ponga el hielo en una bolsa plstica.  Coloque una toalla entre la piel y la bolsa de hielo.  Coloque el hielo durante , 2 a  3veces por Futures trader.  Si se lo indican, aplique calor en la zona afectada tan frecuentemente como se lo haya indicado el mdico. Use la fuente de calor que el mdico le recomiende, como una compresa de calor hmedo o una almohadilla trmica.  Coloque una toalla entre la piel y la fuente de Airline pilot.  Aplique el calor durante 20 a .  Retire la fuente de calor si la piel se le pone de color rojo brillante. Esto es muy importante si no puede sentir el dolor, el calor o el fro. Puede correr un riesgo mayor de sufrir quemaduras. Actividad  Descanse y retome sus actividades normales como se lo haya indicado el mdico. Pregntele al mdico qu actividades son seguras para usted.  Evite las actividades que demandan mucho esfuerzo (que son extenuantes) durante el tiempo que le haya indicado el mdico.  Haga ejercicios como se lo haya indicado el mdico. Instrucciones generales  [employer] medicamentos de venta libre y los recetados solamente como se lo haya indicado el mdico.  Si tiene alguna pregunta o inquietud sobre la seguridad mientras toma analgsicos, hable con el mdico.  No conduzca ni opere maquinaria pesada hasta saber cmo lo afectan los analgsicos.  No consuma ningn producto que contenga tabaco, lo que incluye cigarrillos, tabaco de Theatre manager y Administrator, Civil Service. El tabaco puede retrasar el proceso de curacin. Si necesita ayuda para dejar de fumar, consulte al mdico.  Concurra a todas las visitas de control como se lo haya indicado el mdico. Esto es importante. PREVENCIN  Precaliente y elongue adecuadamente antes de la [place].  Reljese y elongue despus de realizar [place].  Dele a su cuerpo tiempo para [employer] perodos de [place].  Evite lo siguiente:  Estar fsicamente inactivo durante perodos prolongados.  Hacer ejercicio o practicar deportes cuando est cansado o dolorido.  Practique deportes y levante objetos pesados de la forma  correcta.  Adopte una buena postura mientras est sentado y de pie.  Mantenga un peso saludable.  Duerma en un colchn de firmeza media para apoyar la columna.  Asegrese de Chemical engineer un equipo apto para usted, incluidos zapatos que [employer].  Tome medidas de seguridad y sea responsable al hacer Ether Griffins, para evitar las cadas.  Haga por lo menos de ejercicios de intensidad moderada cada semana, como caminar a paso ligero o hacer gimnasia acutica. Pruebe alguna forma de ejercicio que le quite tensin de la espalda, como nadar o Chemical engineer la bicicleta fija.  Mantenga un buen estado fsico, esto incluye lo siguiente:  La fuerza.  La flexibilidad.  La capacidad cardiovascular.  La resistencia. SOLICITE ATENCIN MDICA SI:  El dolor de espalda no mejora despus de 6semanas de [place].  Los sntomas empeoran. SOLICITE ATENCIN MDICA DE INMEDIATO SI:  El dolor de espalda es muy intenso.  Nota que no puede pararse o caminar.  Siente dolor en las piernas.  Siente debilidad en las nalgas o en las piernas.  Tiene problemas para Scientist, physiological miccin o los movimientos intestinales. Esta informacin no tiene Theme park manager el consejo del mdico. Asegrese de hacerle al mdico cualquier pregunta que tenga. Document Released: 10/01/2006 Document Revised: 05/01/2015 Document Reviewed: 09/26/2015 Elsevier Interactive Patient Education  2017 [employer].  IF you received an x-ray today, you will receive an invoice from [facility]. Please contact [facility] at [phone] with questions or concerns regarding your invoice.   IF you received labwork today, you will receive an invoice from [place]. Please contact [facility] at [phone] with questions or concerns regarding your invoice.   Our billing staff will not be able to assist you with questions regarding bills from these companies.  You will be contacted with the lab  results as soon as they are available. The fastest way to get your results is to activate your My Chart account. Instructions are located on the last page of this paperwork. If you have not heard from [place] regarding the results in 2 weeks, please contact this office.

## 2017-04-18 ENCOUNTER — Ambulatory Visit: Payer: Self-pay

## 2017-04-21 ENCOUNTER — Ambulatory Visit (INDEPENDENT_AMBULATORY_CARE_PROVIDER_SITE_OTHER): Payer: Worker's Compensation | Admitting: Physician Assistant

## 2017-04-21 VITALS — BP 120/75 | HR 77 | Temp 98.1°F | Resp 18 | Ht 60.5 in | Wt 144.8 lb

## 2017-04-21 DIAGNOSIS — M5441 Lumbago with sciatica, right side: Secondary | ICD-10-CM

## 2017-04-21 MED ORDER — IBUPROFEN 600 MG PO TABS: 600.0000 mg | ORAL_TABLET | Freq: Three times a day (TID) | ORAL | 0 refills | Status: DC | PRN

## 2017-04-21 NOTE — Progress Notes (Signed)
PRIMARY CARE AT [facility] [address], [place] [address] [phone]  Date:  04/21/2017   Name:  Leslie Hess   DOB:  [birth year]   MRN:  [id number]  PCP:  No PCP Per Patient    History of Present Illness:  Leslie Hess is a 36 y.o. female patient who presents to PCP with  Chief Complaint  Patient presents with  . Follow-up    back pain     Injury occurred 03/26/2017, while working.  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   She has more pain at her lower back and buttock.   Injured lower buttock that is aggravated by standing and sitting up, when she lifts her leg upward, she has continued pain.  No numbness or tingling.   Patient states that this pain does not stop her from doing her work. She is performing stretches three times per day.   There are no active problems to display for this patient.   Past Medical History:  Diagnosis Date  . Medical history non-contributory     Past Surgical History:  Procedure Laterality Date  . NO PAST SURGERIES      Social History  Substance Use Topics  . Smoking status: Never Smoker  . Smokeless tobacco: Never Used  . Alcohol use No    Family History  Problem Relation Age of Onset  . Diabetes Mother   . Hypertension Mother   . Diabetes Maternal Grandmother     Allergies  Allergen Reactions  . Fish Allergy Itching    Blisters on skin    Medication list has been reviewed and updated.  Current Outpatient Prescriptions on File Prior to Visit  Medication Sig Dispense Refill  . cyclobenzaprine (FLEXERIL) 10 MG tablet Take 0.5-1 tablets (5-10 mg total) by mouth 3 (three) times daily as needed for muscle spasms. 30 tablet 0  . ibuprofen (ADVIL,MOTRIN) 600 MG tablet Take 1 tablet (600 mg total) by mouth every 6 (six) hours as needed for pain. 30 tablet 0  . predniSONE (DELTASONE) 20 MG tablet Take 3  PO QAM x3days, 2 PO QAM x3days, 1 PO QAM x3days 18 tablet 0  . Prenatal Vit-Fe Fumarate-FA (PRENATAL MULTIVITAMIN) TABS Take 1 tablet by mouth daily at 12 noon.    . traMADol (ULTRAM) 50 MG tablet Take 1 tablet (50 mg total) by mouth every 8 (eight) hours as needed. 15 tablet 0   No current facility-administered medications on file prior to visit.     ROS ROS otherwise unremarkable unless listed above.  Physical Examination: BP 120/75 (BP Location: Right Arm, Patient Position: Sitting, Cuff Size: Small)   Pulse 77   Temp 98.1 F (36.7 C) (Oral)   Resp 18   Ht 5' 0.5" (1.537 m)   Wt 144 lb 12.8 oz (65.7 kg)   LMP 03/22/2017 (Approximate)   SpO2 100%   BMI 27.81 kg/m  Ideal Body Weight: Weight in (lb) to have BMI = 25: 129.9  Physical Exam  Constitutional: She is oriented to person, place, and time. She appears well-developed and well-nourished. No distress.  HENT:  Head: Normocephalic and atraumatic.  Right Ear: External ear normal.  Left Ear: External ear normal.  Eyes: Conjunctivae and EOM are normal. Pupils are equal, round, and reactive to light.  Cardiovascular: Normal rate and regular rhythm.  Exam reveals no friction rub.  Pulmonary/Chest: Effort normal. No respiratory distress. She has no wheezes.  Musculoskeletal:       Lumbar back: She exhibits tenderness (tender along the lower lumbar vertebrae.  some tenderness along the buttock ). She exhibits no spasm.  +straight leg raise test bilaterally with pain reported at the right leg.  Neurological: She is alert and oriented to person, place, and time.  Skin: She is not diaphoretic.  Psychiatric: She has a normal mood and affect. Her behavior is normal.  Dg Lumbar Spine Complete  Result Date: 03/27/2017 CLINICAL DATA:  Low back pain following bending, initial encounter EXAM: LUMBAR SPINE - COMPLETE 4+ VIEW COMPARISON:  None. FINDINGS: Five lumbar type vertebral bodies are well visualized. Vertebral body height is well  maintained. No pars defects are seen. No anterolisthesis is noted. Mild L4-5 disc space narrowing is noted. No soft tissue abnormality is seen. An IUD is noted in place. IMPRESSION: Minimal disc space narrowing.  No acute abnormality noted. Electronically Signed   By: Alcide Clever M.D.   On: 03/27/2017 09:41   Dg Sacrum/coccyx  Result Date: 03/27/2017 CLINICAL DATA:  Acute bilateral low back pain with sciatica, sacral pain EXAM: SACRUM AND COCCYX - 2+ VIEW COMPARISON:  None. FINDINGS: There is no evidence of fracture or other focal bone lesions. IUD is noted midline pelvis. IMPRESSION: Negative. Electronically Signed   By: Natasha Mead M.D.   On: 03/27/2017 09:41      Assessment and Plan: Leslie Hess is a 36 y.o. female who is here today for cc of low back pain Appears consistent with sciatica. Patient would like to continue working, and states she can. Given  every 6 hours as needed.  Advised precautions. Ortho consult given.  Advised that she continue the stretches.  Difficult to note if the pain is sciatica or this narrowing  l4/l5 Follow up in 1 week Acute right-sided low back pain with right-sided sciatica - Plan: ibuprofen (ADVIL,MOTRIN) 600 MG tablet, Ambulatory referral to Orthopedics  Trena Platt, PA-C [facility] 4/27/20188:58 AM

## 2017-04-21 NOTE — Patient Instructions (Addendum)
IF you received an x-ray today, you will receive an invoice from [facility]. Please contact [facility] at [phone] with questions or concerns regarding your invoice.   IF you received labwork today, you will receive an invoice from [place]. Please contact [facility] at [phone] with questions or concerns regarding your invoice.   Our billing staff will not be able to assist you with questions regarding bills from these companies.  You will be contacted with the lab results as soon as they are available. The fastest way to get your results is to activate your My Chart account. Instructions are located on the last page of this paperwork. If you have not heard from [place] regarding the results in 2 weeks, please contact this office.      Citica (Sciatica) La citica es el dolor, la debilidad, el entumecimiento o el hormigueo a lo largo del nervio citico. El nervio citico comienza en la parte inferior de la espalda y desciende por la parte posterior de cada pierna. La citica se produce cuando este nervio se comprime o se ejerce presin sobre l. Suele desaparecer por s sola o con tratamiento. A veces, la citica puede volver a aparecer. CUIDADOS EN EL HOGAR Medicamentos  [employer] de venta libre y los recetados solamente como se lo haya indicado el mdico.  No conduzca ni use maquinaria pesada mientras toma analgsicos recetados. Control del dolor  Si se lo indican, aplique hielo sobre la zona afectada.  Ponga el hielo en una bolsa plstica.  Coloque una toalla entre la piel y la bolsa de hielo.  Coloque el hielo durante , 2 a 3veces por Futures trader.  Despus del hielo, aplique calor sobre la zona afectada antes de hacer ejercicio o con la frecuencia que le haya indicado el mdico. Use la fuente de calor que el mdico le indique, por ejemplo, una compresa de calor hmedo o una almohadilla trmica.  Coloque una toalla entre la piel y la  fuente de Airline pilot.  Aplique el calor durante 20 a .  Retire la fuente de calor si la piel se le pone de color rojo brillante. Esto es muy importante si no puede sentir el dolor, el calor o el fro. Puede correr un riesgo mayor de sufrir quemaduras. Actividad  Retome sus actividades habituales como se lo haya indicado el mdico. Pregntele al mdico qu actividades son seguras para usted.  Evite las actividades que empeoran la citica.  Descanse por breves perodos Administrator. Hgalo recostado o de pie. Esto suele ser mejor que descansar sentado.  Cuando descanse durante perodos ms largos, haga alguna actividad fsica o un estiramiento entre los perodos de descanso.  Evite estar sentado durante largos perodos sin moverse. Levntese y [place] al menos una vez cada hora.  Haga ejercicios y estrese con regularidad, como se lo indic el mdico.  No levante nada que pese ms de 10libras (4,5kg) mientras tenga sntomas de citica.  Aunque no tenga sntomas, evite levantar objetos pesados.  Evite levantar objetos pesados de forma repetida.  Al levantar objetos, hgalos siempre de una forma que sea segura para su cuerpo. Para esto, debe hacer lo siguiente:  Flexione las rodillas.  Mantenga el objeto cerca del cuerpo.  No se tuerza. Instrucciones generales  Mantenga una buena postura.  Evite reclinarse hacia adelante cuando est sentado.  Evite encorvar la espalda mientras est de pie.  Mantenga un peso saludable.  Use calzado cmodo, que le d soporte al pie. Evite usar  tacones.  Evite dormir sobre un colchn que sea demasiado blando o demasiado duro. Es posible que sienta menos dolor si duerme en un colchn con apoyo suficientemente firme para la espalda.  Concurra a todas las visitas de control como se lo haya indicado el mdico. Esto es importante. SOLICITE AYUDA SI:  Tiene un dolor con estas caractersticas:  Lo despierta cuando est  dormido.  Empeora al estar recostado.  Es Government social research officer que tena en el pasado.  Dura ms de 4semanas.  Pierde peso sin proponrselo. SOLICITE AYUDA DE INMEDIATO SI:  No puede controlar la orina (miccin) ni la evacuacin de la materia fecal (defecacin).  Tiene debilidad en alguna de estas zonas, y la debilidad empeora.  La parte inferior de la espalda.  La parte inferior del abdomen (pelvis).  Los glteos.  Las piernas.  Siente irritacin o inflamacin en la espalda.  Tiene sensacin de ardor al [employer]. Esta informacin no tiene Theme park manager el consejo del mdico. Asegrese de hacerle al mdico cualquier pregunta que tenga. Document Released: 01/17/2011 Document Revised: 04/07/2016 Document Reviewed: 08/24/2015 Elsevier Interactive Patient Education  2017 [employer].

## 2017-04-29 ENCOUNTER — Encounter: Payer: Self-pay | Admitting: Physician Assistant

## 2017-04-29 ENCOUNTER — Ambulatory Visit (INDEPENDENT_AMBULATORY_CARE_PROVIDER_SITE_OTHER): Payer: Worker's Compensation | Admitting: Physician Assistant

## 2017-04-29 VITALS — BP 115/77 | HR 78 | Temp 98.2°F | Resp 18 | Ht 60.67 in | Wt 143.0 lb

## 2017-04-29 DIAGNOSIS — M5441 Lumbago with sciatica, right side: Secondary | ICD-10-CM

## 2017-04-29 DIAGNOSIS — M5410 Radiculopathy, site unspecified: Secondary | ICD-10-CM

## 2017-04-29 MED ORDER — PREDNISONE 20 MG PO TABS: ORAL_TABLET | ORAL | 0 refills | Status: DC

## 2017-04-29 MED ORDER — CYCLOBENZAPRINE HCL 10 MG PO TABS: 10.0000 mg | ORAL_TABLET | Freq: Three times a day (TID) | ORAL | 0 refills | Status: DC | PRN

## 2017-04-29 MED ORDER — HYDROCODONE-ACETAMINOPHEN 5-325 MG PO TABS: 1.0000 | ORAL_TABLET | Freq: Three times a day (TID) | ORAL | 0 refills | Status: DC | PRN

## 2017-04-29 NOTE — Patient Instructions (Addendum)
  Please continue to ice the back and buttocks three times per day. You will be placed on restrictions for employer so we can help with the inflammation.   I want to see you back in 1 week. Continue the stretches as you have been doing.  Ice directly after.    IF you received an x-ray today, you will receive an invoice from [facility]. Please contact [facility] at [phone] with questions or concerns regarding your invoice.   IF you received labwork today, you will receive an invoice from [place]. Please contact [facility] at [phone] with questions or concerns regarding your invoice.   Our billing staff will not be able to assist you with questions regarding bills from these companies.  You will be contacted with the lab results as soon as they are available. The fastest way to get your results is to activate your My Chart account. Instructions are located on the last page of this paperwork. If you have not heard from [place] regarding the results in 2 weeks, please contact this office.

## 2017-04-29 NOTE — Progress Notes (Signed)
PRIMARY CARE AT [facility] [address] [place] [address] [phone]  Date:  04/29/2017   Name:  Leslie Hess   DOB:  April 28, 1981   MRN:  [id number]  PCP:  No PCP Per Patient    History of Present Illness:  Leslie Hess is a 36 y.o. female patient who presents to PCP with  Chief Complaint  Patient presents with  . Follow-up    WC  patient is here for follow up of her low back pain.  Patient states that it is mildly improving however she has pain that is radiated.  She feels like she can not move her right foot to either side.  The pain starts at the tip of foot to her right buttock.   Some times it feels unstable due to the pain.  She will sit and the pain is aggravated.   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        There are no active problems to display for this patient.   Past Medical History:  Diagnosis Date  . Medical history non-contributory      Medication list has been reviewed and updated.  Current Outpatient Prescriptions on File Prior to Visit  Medication Sig Dispense Refill  . cyclobenzaprine (FLEXERIL) 10 MG tablet Take 0.5-1 tablets (5-10 mg total) by mouth 3 (three) times daily as needed for muscle spasms. 30 tablet 0  . ibuprofen (ADVIL,MOTRIN) 600 MG tablet Take 1 tablet (600 mg total) by mouth every 8 (eight) hours as needed. 30 tablet 0  . predniSONE (DELTASONE) 20 MG tablet Take 3 PO QAM x3days, 2 PO QAM x3days, 1 PO QAM x3days 18 tablet 0  . Prenatal Vit-Fe Fumarate-FA (PRENATAL MULTIVITAMIN) TABS Take 1 tablet by mouth daily at 12 noon.    . traMADol (ULTRAM) 50 MG tablet Take 1 tablet (50 mg total) by mouth every 8 (eight) hours as needed. 15 tablet 0   No current facility-administered medications on file prior to visit.     ROS ROS otherwise unremarkable unless listed above.  Physical Examination: BP 115/77   Pulse 78   Temp 98.2 F (36.8 C) (Oral)   Resp 18   Ht 5' 0.67" (1.541 m)   Wt 143 lb (64.9 kg)    SpO2 100%   BMI 27.32 kg/m  Ideal Body Weight: Weight in (lb) to have BMI = 25: 130.6  Physical Exam  Constitutional: She is oriented to person, place, and time. She appears well-developed and well-nourished. No distress.  HENT:  Head: Normocephalic and atraumatic.  Right Ear: External ear normal.  Left Ear: External ear normal.  Eyes: Conjunctivae and EOM are normal. Pupils are equal, round, and reactive to light.  Cardiovascular: Normal rate and regular rhythm.   Pulmonary/Chest: Effort normal. No respiratory distress.  Musculoskeletal:       Lumbar back: She exhibits normal range of motion.  Neurological: She is alert and oriented to person, place, and time.  Skin: She is not diaphoretic.  Psychiatric: She has a normal mood and affect. Her behavior is normal.     Assessment and Plan: Leslie Hess is a 36 y.o. female who is here today for low back pain. Prednisone taper, and restrictions for work at this time. Icing.  And advised to return in 1 week. Acute right-sided low back pain with right-sided sciatica - Plan: cyclobenzaprine (FLEXERIL) 10 MG tablet, predniSONE (DELTASONE) 20 MG tablet, HYDROcodone-acetaminophen (NORCO) 5-325 MG tablet  Radiculopathy, unspecified spinal region - Plan: HYDROcodone-acetaminophen (  NORCO) 5-325 MG tablet  Trena Platt, PA-C [facility] 5/3/20188:25 AM

## 2017-05-06 ENCOUNTER — Encounter: Payer: Self-pay | Admitting: Physician Assistant

## 2017-05-06 ENCOUNTER — Ambulatory Visit (INDEPENDENT_AMBULATORY_CARE_PROVIDER_SITE_OTHER): Payer: Worker's Compensation | Admitting: Physician Assistant

## 2017-05-06 VITALS — BP 111/74 | HR 88 | Temp 98.1°F | Resp 18 | Ht 60.68 in | Wt 146.4 lb

## 2017-05-06 DIAGNOSIS — M5441 Lumbago with sciatica, right side: Secondary | ICD-10-CM

## 2017-05-06 DIAGNOSIS — M5410 Radiculopathy, site unspecified: Secondary | ICD-10-CM

## 2017-05-06 NOTE — Progress Notes (Signed)
PRIMARY CARE AT [facility] [address] [place] [address] [phone]  Date:  05/06/2017   Name:  Leslie Hess   DOB:  07/17/1981   MRN:  [id number]  PCP:  Patient, No Pcp Per    History of Present Illness:  Leslie Hess is a 36 y.o. female patient who presents to PCP with  Chief Complaint  Patient presents with  . Follow-up    back pain     Still has pain but it is not hurting as much as it was.   The medication appears to help, prednisone and the flexeril appear to be helping.   She does do the stretching, and ice directly after.  Exercises and then places the ice.  She reports that she has much better range of motion with her pain. 6/13 was earliest appointment for eval with orthopedist.    There are no active problems to display for this patient.   Past Medical History:  Diagnosis Date  . Medical history non-contributory     Past Surgical History:  Procedure Laterality Date  . NO PAST SURGERIES      Social History  Substance Use Topics  . Smoking status: Never Smoker  . Smokeless tobacco: Never Used  . Alcohol use No    Family History  Problem Relation Age of Onset  . Diabetes Mother   . Hypertension Mother   . Diabetes Maternal Grandmother     Allergies  Allergen Reactions  . Fish Allergy Itching    Blisters on skin    Medication list has been reviewed and updated.  Current Outpatient Prescriptions on File Prior to Visit  Medication Sig Dispense Refill  . cyclobenzaprine (FLEXERIL) 10 MG tablet Take 1 tablet (10 mg total) by mouth 3 (three) times daily as needed for muscle spasms. 30 tablet 0  . HYDROcodone-acetaminophen (NORCO) 5-325 MG tablet Take 1 tablet by mouth every 8 (eight) hours as needed. 20 tablet 0  . ibuprofen (ADVIL,MOTRIN) 600 MG tablet Take 1 tablet (600 mg total) by mouth every 8 (eight) hours as needed. 30 tablet 0  . predniSONE (DELTASONE) 20 MG tablet Take 3 PO QAM x3days, 2 PO QAM x3days, 1 PO QAM  x3days 18 tablet 0  . Prenatal Vit-Fe Fumarate-FA (PRENATAL MULTIVITAMIN) TABS Take 1 tablet by mouth daily at 12 noon.    . traMADol (ULTRAM) 50 MG tablet Take 1 tablet (50 mg total) by mouth every 8 (eight) hours as needed. 15 tablet 0   No current facility-administered medications on file prior to visit.     ROS ROS otherwise unremarkable unless listed above.  Physical Examination: BP 111/74   Pulse 88   Temp 98.1 F (36.7 C) (Oral)   Resp 18   Ht 5' 0.68" (1.541 m)   Wt 146 lb 6.4 oz (66.4 kg)   SpO2 99%   BMI 27.95 kg/m  Ideal Body Weight: Weight in (lb) to have BMI = 25: 130.7  Physical Exam  Constitutional: She is oriented to person, place, and time. She appears well-developed and well-nourished. No distress.  HENT:  Head: Normocephalic and atraumatic.  Right Ear: External ear normal.  Left Ear: External ear normal.  Eyes: Conjunctivae and EOM are normal. Pupils are equal, round, and reactive to light.  Cardiovascular: Normal rate.   Pulmonary/Chest: Effort normal. No respiratory distress.  Musculoskeletal:  Lower lumbar vertebral tenderness and along the right buttock.  Normal rom. Positive straight leg raise test of the right leg.   Forward flexion  mildly decreased rom. Normal lateral deviation and horizontal rotation.    Neurological: She is alert and oriented to person, place, and time.  Reflex Scores:      Patellar reflexes are 2+ on the right side and 2+ on the left side. Skin: She is not diaphoretic.  Psychiatric: She has a normal mood and affect. Her behavior is normal.     Assessment and Plan: Leslie Hess is a 36 y.o. female who is here today for cc of follow up of back pain. Continue work restrictions, and pain meds Stretches, and direct icing encouraged. Follow up in 1 week. Acute right-sided low back pain with right-sided sciatica  Radiculopathy, unspecified spinal region  Trena PlattStephanie English, PA-C [facility] 5/11/20188:18 AM

## 2017-05-06 NOTE — Patient Instructions (Signed)
     IF you received an x-ray today, you will receive an invoice from [facility]. Please contact [facility] at [phone] with questions or concerns regarding your invoice.   IF you received labwork today, you will receive an invoice from [facility]. Please contact [facility] at [phone] with questions or concerns regarding your invoice.   Our billing staff will not be able to assist you with questions regarding bills from these companies.  You will be contacted with the lab results as soon as they are available. The fastest way to get your results is to activate your My Chart account. Instructions are located on the last page of this paperwork. If you have not heard from us regarding the results in 2 weeks, please contact this office.     

## 2017-05-14 ENCOUNTER — Encounter: Payer: Self-pay | Admitting: Physician Assistant

## 2017-05-14 ENCOUNTER — Ambulatory Visit (INDEPENDENT_AMBULATORY_CARE_PROVIDER_SITE_OTHER): Payer: Worker's Compensation | Admitting: Physician Assistant

## 2017-05-14 DIAGNOSIS — M5441 Lumbago with sciatica, right side: Secondary | ICD-10-CM

## 2017-05-14 MED ORDER — CYCLOBENZAPRINE HCL 10 MG PO TABS: 10.0000 mg | ORAL_TABLET | Freq: Three times a day (TID) | ORAL | 0 refills | Status: DC | PRN

## 2017-05-14 MED ORDER — IBUPROFEN 600 MG PO TABS: 600.0000 mg | ORAL_TABLET | Freq: Three times a day (TID) | ORAL | 0 refills | Status: DC | PRN

## 2017-05-14 NOTE — Progress Notes (Signed)
[facility] [address], [place] [phone]  Date:  05/14/2017   Name:  Leslie Hess   DOB:  04/15/1981   MRN:  [id number]  PCP:  Patient, No Pcp Per    History of Present Illness:  Leslie Hess is a 36 y.o. female patient who presents to PCP with  Chief Complaint  Patient presents with  . Follow-up    WORKER'S COMP INJURY - lower back     Patient is here for follow up of her low back pain.  She notes improvement, but continues to have a shooting pain down her right leg when she stands.  She has returned to work, but reports that she is not lifting any heavy objects, or bending.   She is stretching and icing. She has taken the norco when pain is heightened, and notes improvement of her low back pain.   There are no active problems to display for this patient.   Past Medical History:  Diagnosis Date  . Medical history non-contributory     Past Surgical History:  Procedure Laterality Date  . NO PAST SURGERIES      Social History  Substance Use Topics  . Smoking status: Never Smoker  . Smokeless tobacco: Never Used  . Alcohol use No    Family History  Problem Relation Age of Onset  . Diabetes Mother   . Hypertension Mother   . Diabetes Maternal Grandmother     Allergies  Allergen Reactions  . Fish Allergy Itching    Blisters on skin    Medication list has been reviewed and updated.  Current Outpatient Prescriptions on File Prior to Visit  Medication Sig Dispense Refill  . cyclobenzaprine (FLEXERIL) 10 MG tablet Take 1 tablet (10 mg total) by mouth 3 (three) times daily as needed for muscle spasms. 30 tablet 0  . HYDROcodone-acetaminophen (NORCO) 5-325 MG tablet Take 1 tablet by mouth every 8 (eight) hours as needed. 20 tablet 0  . ibuprofen (ADVIL,MOTRIN) 600 MG tablet Take 1 tablet (600 mg total) by mouth every 8 (eight) hours as needed. 30 tablet 0  . traMADol (ULTRAM) 50 MG tablet Take 1 tablet (50 mg  total) by mouth every 8 (eight) hours as needed. 15 tablet 0  . predniSONE (DELTASONE) 20 MG tablet Take 3 PO QAM x3days, 2 PO QAM x3days, 1 PO QAM x3days (Patient not taking: Reported on 05/14/2017) 18 tablet 0  . Prenatal Vit-Fe Fumarate-FA (PRENATAL MULTIVITAMIN) TABS Take 1 tablet by mouth daily at 12 noon.     No current facility-administered medications on file prior to visit.     ROS ROS otherwise unremarkable unless listed above.  Physical Examination: BP 100/67 (BP Location: Right Arm, Patient Position: Sitting, Cuff Size: Normal)   Pulse 97   Temp 99.2 F (37.3 C) (Oral)   Resp 16   Ht 5' 0.25" (1.53 m)   Wt 144 lb (65.3 kg)   LMP 04/20/2017   SpO2 98%   BMI 27.89 kg/m  Ideal Body Weight: Weight in (lb) to have BMI = 25: 128.8  Physical Exam  Constitutional: She is oriented to person, place, and time. She appears well-developed and well-nourished. No distress.  HENT:  Head: Normocephalic and atraumatic.  Right Ear: External ear normal.  Left Ear: External ear normal.  Eyes: Conjunctivae and EOM are normal. Pupils are equal, round, and reactive to light.  Cardiovascular: Normal rate.   Pulmonary/Chest: Effort normal. No respiratory distress.  Musculoskeletal:  Lumbar back: She exhibits decreased range of motion (forward flexion is minimally decreased.) and bony tenderness. She exhibits no tenderness, no swelling and no spasm.  Neurological: She is alert and oriented to person, place, and time.  Reflex Scores:      Patellar reflexes are 2+ on the right side and 2+ on the left side. Skin: She is not diaphoretic.  Psychiatric: She has a normal mood and affect. Her behavior is normal.     Assessment and Plan: Leslie Hess is a 36 y.o. female who is here today for cc follow up of back pain. Acute right-sided low back pain with right-sided sciatica - Plan: cyclobenzaprine (FLEXERIL) 10 MG tablet, ibuprofen (ADVIL,MOTRIN) 600 MG tablet  Trena Platt, PA-C [facility] 5/19/20189:37 AM

## 2017-05-14 NOTE — Patient Instructions (Addendum)
Continue to ice the buttock and back.   Please continue to also do the stretches, three times per day.  Followed by the ice.       IF you received an x-ray today, you will receive an invoice from [facility]. Please contact [facility] at [phone] with questions or concerns regarding your invoice.   IF you received labwork today, you will receive an invoice from [facility]. Please contact [facility] at [phone] with questions or concerns regarding your invoice.   Our billing staff will not be able to assist you with questions regarding bills from these companies.  You will be contacted with the lab results as soon as they are available. The fastest way to get your results is to activate your My Chart account. Instructions are located on the last page of this paperwork. If you have not heard from us regarding the results in 2 weeks, please contact this office.

## 2017-05-21 ENCOUNTER — Ambulatory Visit (INDEPENDENT_AMBULATORY_CARE_PROVIDER_SITE_OTHER): Payer: Worker's Compensation | Admitting: Physician Assistant

## 2017-05-21 ENCOUNTER — Encounter: Payer: Self-pay | Admitting: Physician Assistant

## 2017-05-21 VITALS — BP 110/74 | HR 89 | Temp 98.9°F | Resp 18 | Ht 60.25 in | Wt 145.2 lb

## 2017-05-21 DIAGNOSIS — M5441 Lumbago with sciatica, right side: Secondary | ICD-10-CM

## 2017-05-21 DIAGNOSIS — M5410 Radiculopathy, site unspecified: Secondary | ICD-10-CM

## 2017-05-21 NOTE — Patient Instructions (Signed)
     IF you received an x-ray today, you will receive an invoice from [facility]. Please contact [facility] at [phone] with questions or concerns regarding your invoice.   IF you received labwork today, you will receive an invoice from [facility]. Please contact [facility] at [phone] with questions or concerns regarding your invoice.   Our billing staff will not be able to assist you with questions regarding bills from these companies.  You will be contacted with the lab results as soon as they are available. The fastest way to get your results is to activate your My Chart account. Instructions are located on the last page of this paperwork. If you have not heard from us regarding the results in 2 weeks, please contact this office.     

## 2017-05-21 NOTE — Progress Notes (Signed)
     Leslie Hess 11/02/1981 36 y.o.   Chief Complaint  Patient presents with  . Follow-up    w/c on back     Presents for evaluation of work-related complaint.  Date of Injury: 3.29.18  History of Present Illness:  She has been taking the pills and doing the stretches.  She is icing.   She is currently working with restrictions--she is not bending or lifting heavy things at this time.   She is able to bend her leg, but she does have pain when she touches the the lower front her leg.  She tries not to bend her leg.       ROS ROS otherwise unremarkable unless listed above.    Current medications and allergies reviewed and updated. Past medical history, family history, social history have been reviewed and updated.   Physical Exam  Constitutional: She is well-developed, well-nourished, and in no distress. No distress.  HENT:  Head: Normocephalic.  Eyes: Pupils are equal, round, and reactive to light.  Cardiovascular: Normal rate, regular rhythm, normal heart sounds and intact distal pulses.  Exam reveals no friction rub.   No murmur heard. Pulmonary/Chest: Effort normal and breath sounds normal. No respiratory distress.  Musculoskeletal:       Lumbar back: She exhibits bony tenderness (piriformis like tenderness at the right buttock). She exhibits normal range of motion, no spasm and normal pulse.  Skin: She is not diaphoretic.     Assessment and Plan: Improving Continue icing anti-inflammatory, and stretches Follow up in 2 weeks Radiculopathy, unspecified spinal region  Acute right-sided low back pain with right-sided sciatica  Trena PlattStephanie Terrez Ander, PA-C [facility] 5/26/20189:57 PM

## 2017-06-10 ENCOUNTER — Ambulatory Visit (INDEPENDENT_AMBULATORY_CARE_PROVIDER_SITE_OTHER): Payer: Self-pay | Admitting: Specialist

## 2017-06-12 ENCOUNTER — Ambulatory Visit (INDEPENDENT_AMBULATORY_CARE_PROVIDER_SITE_OTHER): Payer: Worker's Compensation | Admitting: Physician Assistant

## 2017-06-12 ENCOUNTER — Encounter: Payer: Self-pay | Admitting: Physician Assistant

## 2017-06-12 VITALS — BP 108/78 | HR 78 | Temp 98.0°F | Ht 60.25 in | Wt 145.2 lb

## 2017-06-12 DIAGNOSIS — M545 Low back pain: Secondary | ICD-10-CM

## 2017-06-12 DIAGNOSIS — Z09 Encounter for follow-up examination after completed treatment for conditions other than malignant neoplasm: Secondary | ICD-10-CM

## 2017-06-12 NOTE — Patient Instructions (Addendum)
I would like you to do the stretches that we discussed.  These are also good strength training.  Ice directly after.  Ice after work.   If you need, take ibuprofen 600mg  every 6 hours as needed.       IF you received an x-ray today, you will receive an invoice from [facility]. Please contact [facility] at [phone] with questions or concerns regarding your invoice.   IF you received labwork today, you will receive an invoice from [facility]. Please contact [facility] at [phone] with questions or concerns regarding your invoice.   Our billing staff will not be able to assist you with questions regarding bills from these companies.  You will be contacted with the lab results as soon as they are available. The fastest way to get your results is to activate your My Chart account. Instructions are located on the last page of this paperwork. If you have not heard from us regarding the results in 2 weeks, please contact this office.

## 2017-06-12 NOTE — Progress Notes (Signed)
PRIMARY CARE AT [facility] [address] [place] [address] [phone]  Date:  06/12/2017   Name:  Dhara Schepp   DOB:  1981/03/06   MRN:  [id number]  PCP:  Patient, No Pcp Per    History of Present Illness:  Veverly Larimer is a 36 y.o. female patient who presents to PCP with  Chief Complaint  Patient presents with  . Work Related Injury  . Follow-up     Patient states that she is doing much better.  She has no pain.  Patient had cancelled the orthopedist visit as advised, because she was doing much better.  She has not been lifting anything too heavy at work.  She continues to do the stretching.    There are no active problems to display for this patient.   Past Medical History:  Diagnosis Date  . Medical history non-contributory     Past Surgical History:  Procedure Laterality Date  . NO PAST SURGERIES      Social History  Substance Use Topics  . Smoking status: Never Smoker  . Smokeless tobacco: Never Used  . Alcohol use No    Family History  Problem Relation Age of Onset  . Diabetes Mother   . Hypertension Mother   . Diabetes Maternal Grandmother     Allergies  Allergen Reactions  . Fish Allergy Itching    Blisters on skin    Medication list has been reviewed and updated.  Current Outpatient Prescriptions on File Prior to Visit  Medication Sig Dispense Refill  . cyclobenzaprine (FLEXERIL) 10 MG tablet Take 1 tablet (10 mg total) by mouth 3 (three) times daily as needed for muscle spasms. (Patient not taking: Reported on 06/12/2017) 30 tablet 0  . HYDROcodone-acetaminophen (NORCO) 5-325 MG tablet Take 1 tablet by mouth every 8 (eight) hours as needed. (Patient not taking: Reported on 06/12/2017) 20 tablet 0  . ibuprofen (ADVIL,MOTRIN) 600 MG tablet Take 1 tablet (600 mg total) by mouth every 8 (eight) hours as needed. (Patient not taking: Reported on 06/12/2017) 30 tablet 0  . predniSONE (DELTASONE) 20 MG tablet Take 3 PO QAM x3days, 2  PO QAM x3days, 1 PO QAM x3days (Patient not taking: Reported on 06/12/2017) 18 tablet 0  . Prenatal Vit-Fe Fumarate-FA (PRENATAL MULTIVITAMIN) TABS Take 1 tablet by mouth daily at 12 noon.    . traMADol (ULTRAM) 50 MG tablet Take 1 tablet (50 mg total) by mouth every 8 (eight) hours as needed. (Patient not taking: Reported on 06/12/2017) 15 tablet 0   No current facility-administered medications on file prior to visit.     ROS ROS otherwise unremarkable unless listed above.  Physical Examination: BP 108/78   Pulse 78   Temp 98 F (36.7 C) (Oral)   Ht 5' 0.25" (1.53 m)   Wt 145 lb 3.2 oz (65.9 kg)   LMP 05/21/2017   SpO2 98%   BMI 28.12 kg/m  Ideal Body Weight: Weight in (lb) to have BMI = 25: 128.8  Physical Exam  Constitutional: She is oriented to person, place, and time. She appears well-developed and well-nourished. No distress.  HENT:  Head: Normocephalic and atraumatic.  Right Ear: External ear normal.  Left Ear: External ear normal.  Eyes: Conjunctivae and EOM are normal. Pupils are equal, round, and reactive to light.  Cardiovascular: Normal rate.   Pulmonary/Chest: Effort normal. No respiratory distress.  Musculoskeletal:       Lumbar back: She exhibits tenderness (right SI tenderness upon deep palpation). She  exhibits normal range of motion, no swelling and no spasm.  Normal range of motion with forward flexion, and lateral deviation, and extension.    Neurological: She is alert and oriented to person, place, and time.  Skin: She is not diaphoretic.  Psychiatric: She has a normal mood and affect. Her behavior is normal.     Assessment and Plan: [name] is a 36 y.o. female who is here today for cc of follow up of low back pain. Much improved.  Will lessen restriction (see note).  Advised to return for follow up. Acute bilateral low back pain, with sciatica presence unspecified  Follow up  Trena PlattStephanie English, PA-C [facility] 6/18/20186:41 AM

## 2017-06-19 ENCOUNTER — Ambulatory Visit: Payer: Self-pay | Admitting: Physician Assistant

## 2017-07-06 ENCOUNTER — Other Ambulatory Visit: Payer: Self-pay | Admitting: Physician Assistant

## 2017-07-07 ENCOUNTER — Ambulatory Visit (INDEPENDENT_AMBULATORY_CARE_PROVIDER_SITE_OTHER): Payer: Worker's Compensation | Admitting: Physician Assistant

## 2017-07-07 ENCOUNTER — Encounter: Payer: Self-pay | Admitting: Physician Assistant

## 2017-07-07 VITALS — BP 122/72 | HR 76 | Temp 98.5°F | Resp 17 | Ht 60.0 in | Wt 145.0 lb

## 2017-07-07 DIAGNOSIS — Z09 Encounter for follow-up examination after completed treatment for conditions other than malignant neoplasm: Secondary | ICD-10-CM

## 2017-07-07 DIAGNOSIS — M5441 Lumbago with sciatica, right side: Secondary | ICD-10-CM

## 2017-07-07 DIAGNOSIS — M545 Low back pain: Secondary | ICD-10-CM

## 2017-07-07 NOTE — Progress Notes (Signed)
[facility] [address], [place] [address] [phone]  Date:  07/07/2017   Name:  Leslie Hess   DOB:  05/14/1981   MRN:  [id number]  PCP:  Patient, No Pcp Per    History of Present Illness:  Leslie Hess is a 36 y.o. female patient who presents to PCP with  Chief Complaint  Patient presents with  . Follow-up    back injury      Patient reports that she still works under her last restrictions about 2 weeks ago.  She reports her back pain has resolved.  She would like restrictions removed.   No radiating pain, or numbness or tingling of the low back  There are no active problems to display for this patient.   Past Medical History:  Diagnosis Date  . Medical history non-contributory     Past Surgical History:  Procedure Laterality Date  . NO PAST SURGERIES      Social History  Substance Use Topics  . Smoking status: Never Smoker  . Smokeless tobacco: Never Used  . Alcohol use No    Family History  Problem Relation Age of Onset  . Diabetes Mother   . Hypertension Mother   . Diabetes Maternal Grandmother     Allergies  Allergen Reactions  . Fish Allergy Itching    Blisters on skin    Medication list has been reviewed and updated.  Current Outpatient Prescriptions on File Prior to Visit  Medication Sig Dispense Refill  . cyclobenzaprine (FLEXERIL) 10 MG tablet Take 1 tablet (10 mg total) by mouth 3 (three) times daily as needed for muscle spasms. (Patient not taking: Reported on 06/12/2017) 30 tablet 0  . HYDROcodone-acetaminophen (NORCO) 5-325 MG tablet Take 1 tablet by mouth every 8 (eight) hours as needed. (Patient not taking: Reported on 06/12/2017) 20 tablet 0  . ibuprofen (ADVIL,MOTRIN) 600 MG tablet Take 1 tablet (600 mg total) by mouth every 8 (eight) hours as needed. (Patient not taking: Reported on 06/12/2017) 30 tablet 0  . predniSONE (DELTASONE) 20 MG tablet Take 3 PO QAM x3days, 2 PO QAM x3days, 1 PO QAM x3days  (Patient not taking: Reported on 06/12/2017) 18 tablet 0  . Prenatal Vit-Fe Fumarate-FA (PRENATAL MULTIVITAMIN) TABS Take 1 tablet by mouth daily at 12 noon.    . traMADol (ULTRAM) 50 MG tablet Take 1 tablet (50 mg total) by mouth every 8 (eight) hours as needed. (Patient not taking: Reported on 06/12/2017) 15 tablet 0   No current facility-administered medications on file prior to visit.     ROS ROS otherwise unremarkable unless listed above.  Physical Examination: BP 122/72   Pulse 76   Temp 98.5 F (36.9 C) (Oral)   Resp 17   Ht 5' (1.524 m)   Wt 145 lb (65.8 kg)   LMP 06/23/2017 (Approximate)   SpO2 98%   BMI 28.32 kg/m  Ideal Body Weight: Weight in (lb) to have BMI = 25: 127.7  Physical Exam  Constitutional: She is oriented to person, place, and time. She appears well-developed and well-nourished. No distress.  HENT:  Head: Normocephalic and atraumatic.  Right Ear: External ear normal.  Left Ear: External ear normal.  Eyes: Pupils are equal, round, and reactive to light. Conjunctivae and EOM are normal.  Cardiovascular: Normal rate.   Pulmonary/Chest: Effort normal. No respiratory distress.  Musculoskeletal:       Lumbar back: She exhibits normal range of motion, no tenderness, no bony tenderness, no pain and  no spasm.  Neurological: She is alert and oriented to person, place, and time.  Skin: She is not diaphoretic.  Psychiatric: She has a normal mood and affect. Her behavior is normal.     Assessment and Plan: Leslie Hess is a 36 y.o. female who is here today for follow up Restrictions lifted.  Acute bilateral low back pain, with sciatica presence unspecified  Acute right-sided low back pain with right-sided sciatica  Follow up  Trena Platt, PA-C [facility] 7/13/20189:28 AM

## 2017-07-07 NOTE — Patient Instructions (Signed)
pc    IF you received an x-ray today, you will receive an invoice from [facility]. Please contact [facility] at [phone] with questions or concerns regarding your invoice.   IF you received labwork today, you will receive an invoice from [facility]. Please contact [facility] at [phone] with questions or concerns regarding your invoice.   Our billing staff will not be able to assist you with questions regarding bills from these companies.  You will be contacted with the lab results as soon as they are available. The fastest way to get your results is to activate your My Chart account. Instructions are located on the last page of this paperwork. If you have not heard from us regarding the results in 2 weeks, please contact this office.     

## 2018-03-30 ENCOUNTER — Encounter: Payer: Self-pay | Admitting: Physician Assistant

## 2018-11-03 ENCOUNTER — Ambulatory Visit (INDEPENDENT_AMBULATORY_CARE_PROVIDER_SITE_OTHER): Payer: Self-pay | Admitting: Physician Assistant

## 2018-11-03 ENCOUNTER — Other Ambulatory Visit: Payer: Self-pay

## 2018-11-03 ENCOUNTER — Encounter (INDEPENDENT_AMBULATORY_CARE_PROVIDER_SITE_OTHER): Payer: Self-pay | Admitting: Physician Assistant

## 2018-11-03 VITALS — BP 108/73 | HR 78 | Temp 98.1°F | Ht 60.0 in | Wt 139.2 lb

## 2018-11-03 DIAGNOSIS — G44209 Tension-type headache, unspecified, not intractable: Secondary | ICD-10-CM

## 2018-11-03 DIAGNOSIS — F430 Acute stress reaction: Secondary | ICD-10-CM

## 2018-11-03 MED ORDER — BACLOFEN 10 MG PO TABS: 10.0000 mg | ORAL_TABLET | Freq: Every day | ORAL | 0 refills | Status: DC

## 2018-11-03 MED ORDER — NAPROXEN 500 MG PO TABS: 500.0000 mg | ORAL_TABLET | Freq: Two times a day (BID) | ORAL | 0 refills | Status: DC | PRN

## 2018-11-03 MED ORDER — DULOXETINE HCL 30 MG PO CPEP
30.0000 mg | ORAL_CAPSULE | Freq: Every day | ORAL | 1 refills | Status: DC
Start: 2018-11-03 — End: 2018-12-15

## 2018-11-03 NOTE — Patient Instructions (Signed)
Estrés y control del estrés  (Stress and Stress Management)  El estrés es una reacción normal a los sucesos de la vida. Es lo que se siente cuando la vida le exige más de lo que está acostumbrado o más de lo que puede manejar. Un poco de estrés puede ser útil. Por ejemplo, la reacción al estrés puede ayudarlo a tomar el último autobús del día, a estudiar para un examen o a cumplir con un plazo límite en el trabajo. Sin embargo, el estrés muy frecuente o que dura mucho tiempo puede causar problemas. Puede afectar la salud emocional y dificultar las relaciones y las actividades cotidianas normales. El exceso de estrés puede debilitar el sistema inmunitario y aumentar el riesgo de que tenga enfermedades físicas. Si ya tiene un problema médico, el estrés puede empeorarlo.  CAUSAS  Todos los sucesos de la vida pueden causar estrés. Un suceso que le causa estrés a una persona puede no ser estresante para otra. Generalmente, los sucesos importantes de la vida causan estrés. Estos pueden ser positivos o negativos, por ejemplo, quedarse sin empleo, mudarse a una casa nueva, casarse, tener un bebé o perder a un ser querido. Los sucesos de la vida menos evidentes también pueden causar estrés, especialmente si ocurren día tras día o en combinación. Por ejemplo, trabajar muchas horas, conducir cuando hay mucho tránsito, cuidar a los niños, tener deudas o tener una relación difícil.  SIGNOS Y SÍNTOMAS  El estrés puede causar síntomas emocionales que incluyen lo siguiente:  · Ansiedad. Sentirse preocupado, temeroso, nervioso, abrumado o fuera de control.  · Enojo. Sentirse irritado o impaciente.  · Depresión. Sentirse triste, decaído, desesperanzado o culpable.  · Dificultad para concentrarse, recordar o tomar decisiones.  El estrés puede causar síntomas físicos que incluyen lo siguiente:  · Dolores y molestias. Estos pueden afectar la cabeza, el cuello, la espalda, el estómago u otras zonas del cuerpo.   · Rigidez muscular o tensión mandibular.  · Poca energía o dificultad para dormir.  El estrés puede provocar conductas poco saludables, que incluyen lo siguiente:  · Comer para sentirse mejor (comer en exceso) o saltear comidas.  · Dormir muy poco, mucho o ambas cosas.  · Trabajar mucho o postergar tareas (posponer).  · Fumar, beber alcohol o consumir drogas para sentirse mejor.  DIAGNÓSTICO  El estrés se diagnostica a través de una evaluación que realiza el médico. El médico le hará preguntas sobre los síntomas o cualquier suceso estresante de la vida. Además, el médico le hará preguntas sobre sus antecedentes médicos y tal vez indique análisis de sangre u otros estudios. Ciertas afecciones y algunos medicamentos pueden provocar síntomas físicos similares a los del estrés. Las enfermedades mentales pueden causar síntomas emocionales y provocar conductas poco sanas similares a los del estrés. El médico podrá derivarlo a un profesional de la salud mental para que le realice una evaluación más profunda.  TRATAMIENTO  El tratamiento recomendado para el estrés es el control del estrés. Los objetivos del control del estrés son reducir los eventos estresantes de la vida y enfrentar el estrés de maneras saludables.  Las técnicas para reducir los eventos estresantes de la vida incluyen lo siguiente:  · Identificación del estrés. Autocontrolar el estrés e identificar qué lo causa. Estas habilidades pueden ayudarlo a evitar algunos sucesos estresantes.  · Control del tiempo. Establecer las prioridades, llevar un calendario de los sucesos y aprender a decir "no". Estas herramientas pueden ayudarlo a evitar que asuma muchos compromisos.  Las técnicas para lidiar   con el estrés incluyen lo siguiente:  · Replantearse el problema. Tratar de pensar de un modo realista los eventos estresantes en lugar de ignorarlos o tener reacciones exageradas. Intente encontrar los aspectos positivos en una situación estresante, en  lugar de enfocarse en los negativos.  · La actividad física. El ejercicio físico puede liberar la tensión física y emocional. La clave es encontrar un tipo de ejercicio físico que disfrute y practique con regularidad.  · Técnicas de relajación. Estas relajan el cuerpo y la mente. Los ejemplos son el yoga, la meditación, el tai chi, la biorregulación, la respiración profunda, la relajación muscular progresiva, escuchar música, estar al aire libre en contacto con la naturaleza, llevar un diario y otros pasatiempos. Nuevamente, la clave es encontrar una o más opciones que disfrute y pueda practicar con regularidad.  · Estilo de vida saludable. Coma una dieta equilibrada, duerma mucho y no fume. Evite el consumo de alcohol o de drogas para relajarse.  · Red de apoyo sólida. Pase tiempo con la familia, los amigos y otras personas cuya compañía disfrute. Exprese sus sentimientos y converse acerca de las cosas que le preocupan con alguien en quien confíe.  La orientación o la psicoterapiacon un profesional de la salud mental pueden ser de ayuda si tiene dificultades para controlar el estrés por sí solo. Generalmente, no se recomiendan medicamentos para el tratamiento del estrés. Hable con el médico si considera que necesita medicamentos para los síntomas de estrés.  INSTRUCCIONES PARA EL CUIDADO EN EL HOGAR  · Concurra a todas las visitas de control como se lo haya indicado el médico.  · Tome todos los medicamentos como se lo haya indicado el médico.    SOLICITE ATENCIÓN MÉDICA SI:  · Los síntomas empeoran o empieza a tener síntomas nuevos.  · Se siente abrumado por los problemas y ya no puede manejarlos solo.    SOLICITE ATENCIÓN MÉDICA DE INMEDIATO SI:  · Siente deseos de lastimarse o lastimar a otra persona.    Esta información no tiene como fin reemplazar el consejo del médico. Asegúrese de hacerle al médico cualquier pregunta que tenga.  Document Released: 09/24/2005 Document Revised: 04/07/2016 Document  Reviewed: 08/09/2013  Elsevier Interactive Patient Education © 2017 Elsevier Inc.

## 2018-11-03 NOTE — Progress Notes (Signed)
Subjective:  Patient ID: Leslie Hess, female    DOB: [birth year]  Age: 37 y.o. MRN: [id number]  CC: headache  HPI Leslie Hess is a 36 y.o. female with a medical history of postpartum depression, prediabetes, right foot fracture, and right fibula fracture presents as a new patient with headache since one month ago. Says she has been stressed dealing with husband's stroke. She has also been very stressed in dealing with her daughter's tooth infection. Her daughter is now scheduled to have three front teeth extracted due to spreading infection. Pain felt in the occiput and radiates to the temporal regions. She has also felt the top of her head become "very hot". Has felt some lightheadedness with some left sided pain/weakness from top of head to bottom of left foot. Has taken Baclofen and Diclofenac with temporary relief of headache. Does not endorse CP, palpitations, SOB, HA, abdominal pain, f/c/n/v, rash, swelling, or GI/GU sxs.        Outpatient Medications Prior to Visit  Medication Sig Dispense Refill  . baclofen (LIORESAL) 10 MG tablet Take 10 mg by mouth at bedtime.    . diclofenac (VOLTAREN) 50 MG EC tablet Take 75 mg by mouth 2 (two) times daily with a meal.  0  . cyclobenzaprine (FLEXERIL) 10 MG tablet Take 1 tablet (10 mg total) by mouth 3 (three) times daily as needed for muscle spasms. (Patient not taking: Reported on 06/12/2017) 30 tablet 0  . HYDROcodone-acetaminophen (NORCO) 5-325 MG tablet Take 1 tablet by mouth every 8 (eight) hours as needed. (Patient not taking: Reported on 06/12/2017) 20 tablet 0  . ibuprofen (ADVIL,MOTRIN) 600 MG tablet Take 1 tablet (600 mg total) by mouth every 8 (eight) hours as needed. (Patient not taking: Reported on 06/12/2017) 30 tablet 0  . predniSONE (DELTASONE) 20 MG tablet Take 3 PO QAM x3days, 2 PO QAM x3days, 1 PO QAM x3days (Patient not taking: Reported on 06/12/2017) 18 tablet 0  . Prenatal Vit-Fe Fumarate-FA (PRENATAL  MULTIVITAMIN) TABS Take 1 tablet by mouth daily at 12 noon.    . traMADol (ULTRAM) 50 MG tablet Take 1 tablet (50 mg total) by mouth every 8 (eight) hours as needed. (Patient not taking: Reported on 06/12/2017) 15 tablet 0   No facility-administered medications prior to visit.      ROS Review of Systems  Constitutional: Negative for chills, fever and malaise/fatigue.  Eyes: Negative for blurred vision.  Respiratory: Negative for shortness of breath.   Cardiovascular: Negative for chest pain and palpitations.  Gastrointestinal: Negative for abdominal pain and nausea.  Genitourinary: Negative for dysuria and hematuria.  Musculoskeletal: Negative for joint pain and myalgias.  Skin: Negative for rash.  Neurological: Positive for headaches. Negative for tingling.  Psychiatric/Behavioral: Negative for depression. The patient is not nervous/anxious.     Objective:  Ht 5' (1.524 m)   Wt 139 lb 3.2 oz (63.1 kg)   LMP 10/19/2018 (Exact Date)   BMI 27.19 kg/m   Vitals:   11/03/18 1356  BP: 108/73  Pulse: 78  Temp: 98.1 F (36.7 C)  TempSrc: Oral  SpO2: 91%  Weight: 139 lb 3.2 oz (63.1 kg)  Height: 5' (1.524 m)      Physical Exam  Constitutional: She is oriented to person, place, and time.  Well developed, well nourished, NAD, polite  HENT:  Head: Normocephalic and atraumatic.  Eyes: No scleral icterus.  Neck: Normal range of motion. Neck supple. No thyromegaly present.  Cardiovascular: Normal rate, regular rhythm and normal  heart sounds.  Pulmonary/Chest: Effort normal and breath sounds normal.  Musculoskeletal: She exhibits no edema.  Normal muscular tonicity of the upper trapezius bilaterally  Neurological: She is alert and oriented to person, place, and time.  Skin: Skin is warm and dry. No rash noted. No erythema. No pallor.  Psychiatric: She has a normal mood and affect. Her behavior is normal. Thought content normal.  Eyes water when recounting struggles she has  faced with her husband's recent stroke and her daughter's tooth infection.  Vitals reviewed.    Assessment & Plan:   1. Tension-type headache, not intractable, unspecified chronicity pattern - Refill baclofen (LIORESAL) 10 MG tablet; Take 1 tablet (10 mg total) by mouth at bedtime.  Dispense: 10 each; Refill: 0 - Begin naproxen (NAPROSYN) 500 MG tablet; Take 1 tablet (500 mg total) by mouth 2 (two) times daily as needed.  Dispense: 30 tablet; Refill: 0  2. Stress reaction, acute - Begin DULoxetine (CYMBALTA) 30 MG capsule; Take 1 capsule (30 mg total) by mouth daily.  Dispense: 30 capsule; Refill: 1   Meds ordered this encounter  Medications  . baclofen (LIORESAL) 10 MG tablet    Sig: Take 1 tablet (10 mg total) by mouth at bedtime.    Dispense:  10 each    Refill:  0    Order Specific Question:   Supervising Provider    Answer:   Hoy Register [[id number]]  . naproxen (NAPROSYN) 500 MG tablet    Sig: Take 1 tablet (500 mg total) by mouth 2 (two) times daily as needed.    Dispense:  30 tablet    Refill:  0    Order Specific Question:   Supervising Provider    Answer:   Hoy Register [[id number]]  . DULoxetine (CYMBALTA) 30 MG capsule    Sig: Take 1 capsule (30 mg total) by mouth daily.    Dispense:  30 capsule    Refill:  1    Order Specific Question:   Supervising Provider    Answer:   Hoy Register [[id number]]    Follow-up: Return in about 6 weeks (around 12/15/2018) for headache.   Loletta Specter PA

## 2018-11-12 ENCOUNTER — Ambulatory Visit: Payer: Self-pay | Attending: Family Medicine

## 2018-12-15 ENCOUNTER — Ambulatory Visit (INDEPENDENT_AMBULATORY_CARE_PROVIDER_SITE_OTHER): Payer: Self-pay | Admitting: Physician Assistant

## 2018-12-15 ENCOUNTER — Other Ambulatory Visit: Payer: Self-pay

## 2018-12-15 ENCOUNTER — Encounter (INDEPENDENT_AMBULATORY_CARE_PROVIDER_SITE_OTHER): Payer: Self-pay | Admitting: Physician Assistant

## 2018-12-15 VITALS — BP 114/79 | HR 69 | Temp 97.7°F | Ht 60.0 in | Wt 137.2 lb

## 2018-12-15 DIAGNOSIS — K089 Disorder of teeth and supporting structures, unspecified: Secondary | ICD-10-CM

## 2018-12-15 DIAGNOSIS — I10 Essential (primary) hypertension: Secondary | ICD-10-CM

## 2018-12-15 DIAGNOSIS — F430 Acute stress reaction: Secondary | ICD-10-CM

## 2018-12-15 DIAGNOSIS — R42 Dizziness and giddiness: Secondary | ICD-10-CM

## 2018-12-15 LAB — POCT GLYCOSYLATED HEMOGLOBIN (HGB A1C): Hemoglobin A1C: 5.2 % (ref 4.0–5.6)

## 2018-12-15 MED ORDER — DULOXETINE HCL 60 MG PO CPEP: 60.0000 mg | ORAL_CAPSULE | Freq: Every day | ORAL | 3 refills | Status: DC

## 2018-12-15 MED FILL — DULoxetine HCL 60 MG CPEP: 60 | 30 days supply | Qty: 30 | Fill #0

## 2018-12-15 NOTE — Progress Notes (Signed)
Subjective:  Patient ID: Leslie Hess, female    DOB: 07/06/1981  Age: 37 y.o. MRN: [id number]  CC: f/u HA  HPI Leslie Muscatsmeralda Hess is a 37 y.o. female with a medical history of postpartum depression, prediabetes, right foot fracture, and right fibula fracture presents to f/u on tension type headache. Last seen here six weeks ago. Diagnosed with tension type headache and stress reaction. Prescribed naproxen, baclofen, and duloxetine. Says headache is much better. Intensity and frequency have subsided significantly. However there is still some mild and infrequent headaches that occur. Only other complaint is of random and infrequent bouts of vertigo. Says she was standing in line at a store and felt a sudden bout of dizziness. Did not syncopize or lose consciousness but became concerned. Does not endorse any associated symptoms with her vertigo, no CP, palpitations, SOB, diaphoresis, nausea, vomiting, fever, chills, abdominal pain, rash, swelling, or GI/GU sxs.     Outpatient Medications Prior to Visit  Medication Sig Dispense Refill  . DULoxetine (CYMBALTA) 30 MG capsule Take 1 capsule (30 mg total) by mouth daily. 30 capsule 1  . baclofen (LIORESAL) 10 MG tablet Take 1 tablet (10 mg total) by mouth at bedtime. (Patient not taking: Reported on 12/15/2018) 10 each 0  . naproxen (NAPROSYN) 500 MG tablet Take 1 tablet (500 mg total) by mouth 2 (two) times daily as needed. (Patient not taking: Reported on 12/15/2018) 30 tablet 0   No facility-administered medications prior to visit.      ROS Review of Systems  Constitutional: Negative for chills, fever and malaise/fatigue.  Eyes: Negative for blurred vision.  Respiratory: Negative for shortness of breath.   Cardiovascular: Negative for chest pain and palpitations.  Gastrointestinal: Negative for abdominal pain and nausea.  Genitourinary: Negative for dysuria and hematuria.  Musculoskeletal: Negative for joint pain and  myalgias.  Skin: Negative for rash.  Neurological: Positive for headaches. Negative for tingling.       Lightheadeness  Psychiatric/Behavioral: Negative for depression. The patient is not nervous/anxious.     Objective:  BP 114/79 (BP Location: Left Arm, Patient Position: Sitting, Cuff Size: Normal)   Pulse 69   Temp 97.7 F (36.5 C) (Oral)   Ht 5' (1.524 m)   Wt 137 lb 3.2 oz (62.2 kg)   LMP 11/22/2018 (Exact Date)   SpO2 100%   BMI 26.80 kg/m   BP/Weight 12/15/2018 11/03/2018 07/07/2017  Systolic BP 114 108 122  Diastolic BP 79 73 72  Wt. (Lbs) 137.2 139.2 145  BMI 26.8 27.19 28.32      Physical Exam Vitals signs reviewed.  Constitutional:      Comments: Well developed, well nourished, NAD, polite  HENT:     Head: Normocephalic and atraumatic.  Eyes:     General: No scleral icterus.    Comments: Conjunctival pallor  Neck:     Musculoskeletal: Normal range of motion and neck supple.     Thyroid: No thyromegaly.  Cardiovascular:     Rate and Rhythm: Normal rate and regular rhythm.     Heart sounds: Normal heart sounds.  Pulmonary:     Effort: Pulmonary effort is normal.     Breath sounds: Normal breath sounds.  Skin:    General: Skin is warm and dry.     Coloration: Skin is not pale.     Findings: No erythema or rash.     Comments: Dry and mildly cracked lips  Neurological:     Mental Status: She is alert and  oriented to person, place, and time.     Comments: Dix Hallpike negative.   Psychiatric:        Behavior: Behavior normal.        Thought Content: Thought content normal.      Assessment & Plan:    1. Hypertension, unspecified type - CBC with Differential - Comprehensive metabolic panel - Lipid panel - TSH - HgB A1c 5.2%  2. Lightheadedness - Signs of dehydration on physical exam. Advised to drink appropriate amount of water. - CBC with Differential - Comprehensive metabolic panel - Lipid panel - TSH - HgB A1c 5.2%  3. Stress reaction,  acute - Increase DULoxetine (CYMBALTA) 60 MG capsule; Take 1 capsule (60 mg total) by mouth daily.  Dispense: 30 capsule; Refill: 3   Meds ordered this encounter  Medications  . DULoxetine (CYMBALTA) 60 MG capsule    Sig: Take 1 capsule (60 mg total) by mouth daily.    Dispense:  30 capsule    Refill:  3    Order Specific Question:   Supervising Provider    Answer:   Hoy Register [[id number]]    Follow-up: 6 weeks for headache   Loletta Specter PA

## 2018-12-15 NOTE — Patient Instructions (Signed)
El estrs  Stress  El estrs es una reaccin normal a los sucesos de la vida. Es lo que se siente cuando la vida le exige ms de lo que est acostumbrado o ms de lo que cree que puede manejar. Un poco de estrs puede ser til, por ejemplo, cuando se estudia para una prueba o se debe cumplir con una fecha lmite para un trabajo. El estrs muy frecuente o que dura mucho tiempo puede causar problemas. Puede afectar la salud emocional y dificultar las relaciones y las actividades cotidianas normales. El exceso de estrs puede debilitar el sistema de defensa del cuerpo (sistema inmunitario) y aumentar el riesgo de tener enfermedades fsicas. Si ya tiene un problema mdico, el estrs puede empeorarlo.  Cules son las causas?  Todos los sucesos de la vida pueden causar estrs. Un suceso que le causa estrs a una persona puede no ser estresante para otra. Los sucesos ms importantes de la vida, sean positivos o negativos, suelen causar estrs. Por ejemplo:   Perder un trabajo o empezar un trabajo nuevo.   Perder a un ser querido.   Mudarse a una casa o un barrio nuevos.   Casarse o divorciarse.   Tener un beb.   Lesionarse o enfermarse.  Los sucesos de la vida menos evidentes tambin pueden causar estrs, especialmente si ocurren da tras da o combinados entre s. Por ejemplo:   Trabajar muchas horas.   Conducir en medio del trfico.   Cuidar de los nios.   Tener deudas.   Estar en una relacin difcil.  Cules son los signos o sntomas?  El estrs puede causar sntomas emocionales, entre ellos, los siguientes:   Ansiedad. Sentirse preocupado, temeroso, nervioso, abrumado o fuera de control.   Enojo, incluso irritacin o impaciencia.   Depresin. Sentirse triste, decado, desesperanzado o culpable.   Dificultad para concentrarse, recordar o tomar decisiones.  El estrs puede causar sntomas fsicos, entre ellos, los siguientes:   Dolores y molestias. Estos pueden afectar la cabeza, el cuello, la  espalda, el estmago u otras zonas del cuerpo.   Rigidez muscular o tensin mandibular.   Poca energa.   Dificultad para dormir.  El estrs puede provocar conductas poco saludables, entre ellas, las siguientes:   Comer para sentirse mejor (comer en exceso) o saltear comidas.   Trabajar mucho o postergar tareas.   Fumar, beber alcohol o consumir drogas para sentirse mejor.  Cmo se diagnostica?  El estrs se diagnostica a travs de una evaluacin que realiza el mdico. Esta afeccin se puede diagnosticar en funcin de lo siguiente:   Sus sntomas y cualquier suceso estresante que le haya ocurrido en la vida.   Sus antecedentes mdicos.   Estudios para descartar otras causas de los sntomas.  Segn la afeccin, el mdico podr derivarlo a un especialista para que le haga ms evaluaciones.  Cmo se trata?    El tratamiento recomendado para el estrs son las tcnicas de control del estrs. Generalmente, no se recomiendan medicamentos para el tratamiento del estrs.  Entre las tcnicas para reducir su respuesta a los sucesos estresantes de la vida, se incluyen las siguientes:   Identificacin del estrs. Observarse a s mismo para detectar sntomas de estrs e identificar sus causas. Estas habilidades pueden ayudarlo a evitar sucesos estresantes o a prepararse para ellos.   Control del tiempo. Establecer las prioridades, llevar un calendario de los sucesos y aprender a decir "no". Estas medidas pueden ayudarlo a evitar que asuma muchos compromisos.  Las tcnicas   los negativos.  Actividad fsica. El ejercicio fsico puede liberar la tensin fsica y Architectural technologist. La clave es encontrar un tipo de ejercicio fsico que disfrute y  practique con regularidad.  Tcnicas de relajacin. Estas relajan el cuerpo y la [place]. Nuevamente, la clave es encontrar una o ms opciones que disfrute y Hydrographic surveyor con regularidad. Por ejemplo: ? Meditacin, respiracin profunda o tcnicas de relajacin progresiva. ? Yoga o tai chi. ? Biorretroalimentacin, tcnicas de plena conciencia o anotaciones en un diario. ? Conservation officer, nature, estar en contacto con la naturaleza o participar en otros pasatiempos.  Llevar un estilo de vida saludable. Siga una dieta equilibrada, tome mucha agua, limite o evite la cafena y [place].  Tener una red de [employer]. Pase tiempo con la familia, los amigos y otras personas cuya compaa disfrute. Exprese sus sentimientos y converse acerca de las cosas que le preocupan con alguien en quien confe. La orientacin o la psicoterapia con un profesional de la salud mental pueden ser de ayuda si tiene dificultades para controlar el estrs por s solo. Siga estas indicaciones en su casa: Estilo de [place] drogas.  No consuma ningn producto que contenga nicotina o tabaco, como cigarrillos y Psychologist, sport and exercise. Si necesita ayuda para dejar de fumar, consulte al mdico.  Limite el consumo de alcohol a no ms de 26mdida por da si es mujer y no est e[place] y 228midas por da si es hombre. Una medida equivale a 12oz (35514mde cerveza, 5oz (148m43me vino o 1oz (44ml25m bebidas alcohlicas de alta graduacin.  No consuma alcohol ni drogas para relajarse.  Lleve una dieta equilibrada que incluya frutas y verduras frescas, cereales integrales, carnes magras, pescado, huevos, frijoles y lcteos descremados. Evite los alimentos procesados y los alimentos con alto contenido de grasa, azcarLocation managerl.  Haga Sherilyn Cooterenos 30min25m de ejercicio 5o ms das de la semana.  Intente dormir de 7 a 8horas todas las noches. Instrucciones generales   Practique las tcnicas de control del  estrs como se lo haya indicado el mdico.  Beba suficiente lquido para mantenTheatre managerina clara o de color amarillo plido.  Tome los medicamentos de venta libre y los recetados solamente como se lo haya indicado el mdico.  ConcurConsulting civil engineeras las visitas de control como se lo haya indicado el mdico. Esto es importante. Comunquese con un mdico si:  Los sntomas empeoran.  Aparecen nuevos sntomas.  Se siente abrumado por los problemas y ya no puede manejarlos solo. Solicite ayuda de inmediato si:  Tiene pensamientos acerca de lastimGlass blower/designerr a otras Producer, television/film/videolguna vez siente que puede lastimarse o lastimMarket researcher dems, o tiene pensamientos de poner fin a su vida, busque ayuda de inmediato. Puede dirigirse al servicio de urgencias ms cercano o comunicarse con:  El servicio de emergeTherapist, sportsen los [place]).  Una lnea de asistencia al suicida y atenciFreight forwarderisis, como la Lnea NLincoln National Corporationevencin del Suicidio (National Suicide Prevention Lifeline) al 1-800-(317) 028-4009disponible las 24 horas del da. Resumen  El estrs es una reaccin normal a los sucesos de la vida. Puede causar problemas si es muy frecuente o dura mucho The PNC Financialmejor forma de tratar el estrs es practiPsychologist, prison and probation servicescnicas de control del estrs.  La orientacin o la psicoterapia con un profesional de la salud mental pueden ser de ayuda si tiene dificultades para controlar el estrs por s solo. Esta informacin no tiene  Est disponible las 24 horas del da.  Resumen   El estrs es una reaccin normal a los sucesos de la vida. Puede causar problemas si es muy frecuente o dura mucho tiempo.   La mejor forma de tratar el estrs es practicar las tcnicas de control del estrs.   La orientacin o la psicoterapia con un profesional de la salud mental pueden ser de ayuda si tiene dificultades para controlar el estrs por s solo.  Esta informacin no tiene como fin reemplazar el consejo del mdico. Asegrese de hacerle al mdico cualquier pregunta que tenga.  Document Released: 09/24/2005 Document Revised: 08/25/2017 Document Reviewed: 08/25/2017  Elsevier Interactive Patient Education  2019 Elsevier Inc.

## 2018-12-16 ENCOUNTER — Telehealth (INDEPENDENT_AMBULATORY_CARE_PROVIDER_SITE_OTHER): Payer: Self-pay

## 2018-12-16 LAB — CBC WITH DIFFERENTIAL/PLATELET
BASOS ABS: 0 10*3/uL (ref 0.0–0.2)
BASOS: 0 %
EOS (ABSOLUTE): 0.1 10*3/uL (ref 0.0–0.4)
Eos: 1 %
HEMATOCRIT: 35.8 % (ref 34.0–46.6)
Hemoglobin: 12.2 g/dL (ref 11.1–15.9)
IMMATURE GRANS (ABS): 0 10*3/uL (ref 0.0–0.1)
Immature Granulocytes: 0 %
LYMPHS ABS: 2 10*3/uL (ref 0.7–3.1)
LYMPHS: 35 %
MCH: 28.8 pg (ref 26.6–33.0)
MCHC: 34.1 g/dL (ref 31.5–35.7)
MCV: 84 fL (ref 79–97)
MONOCYTES: 6 %
Monocytes Absolute: 0.3 10*3/uL (ref 0.1–0.9)
NEUTROS ABS: 3.1 10*3/uL (ref 1.4–7.0)
Neutrophils: 58 %
Platelets: 258 10*3/uL (ref 150–450)
RBC: 4.24 x10E6/uL (ref 3.77–5.28)
RDW: 13.5 % (ref 12.3–15.4)
WBC: 5.5 10*3/uL (ref 3.4–10.8)

## 2018-12-16 LAB — COMPREHENSIVE METABOLIC PANEL
ALT: 26 IU/L (ref 0–32)
AST: 26 IU/L (ref 0–40)
Albumin/Globulin Ratio: 1.8 (ref 1.2–2.2)
Albumin: 4.4 g/dL (ref 3.5–5.5)
Alkaline Phosphatase: 81 IU/L (ref 39–117)
BUN / CREAT RATIO: 13 (ref 9–23)
BUN: 9 mg/dL (ref 6–20)
CO2: 23 mmol/L (ref 20–29)
CREATININE: 0.7 mg/dL (ref 0.57–1.00)
Calcium: 8.9 mg/dL (ref 8.7–10.2)
Chloride: 105 mmol/L (ref 96–106)
GFR calc non Af Amer: 113 mL/min/{1.73_m2} (ref >59)
GFR, EST AFRICAN AMERICAN: 130 mL/min/{1.73_m2} (ref >59)
GLOBULIN, TOTAL: 2.5 g/dL (ref 1.5–4.5)
GLUCOSE: 84 mg/dL (ref 65–99)
Potassium: 4.1 mmol/L (ref 3.5–5.2)
SODIUM: 143 mmol/L (ref 134–144)
TOTAL PROTEIN: 6.9 g/dL (ref 6.0–8.5)

## 2018-12-16 LAB — TSH: TSH: 1.76 u[IU]/mL (ref 0.450–4.500)

## 2018-12-16 LAB — LIPID PANEL
Chol/HDL Ratio: 3.8 ratio (ref 0.0–4.4)
Cholesterol, Total: 181 mg/dL (ref 100–199)
HDL: 48 mg/dL (ref >39)
LDL CALC: 112 mg/dL — AB (ref 0–99)
Triglycerides: 103 mg/dL (ref 0–149)
VLDL CHOLESTEROL CAL: 21 mg/dL (ref 5–40)

## 2018-12-16 NOTE — Telephone Encounter (Signed)
-----   Message from Loletta Specteroger David Gomez, PA-C sent at 12/16/2018 12:56 PM EST ----- Labs normal except for mildly elevated cholesterol. Please begin a low fat and carb diet. Exercise regularly. Redraw lipid panel in six months.

## 2018-12-16 NOTE — Telephone Encounter (Signed)
Call placed using pacific interpreter [phone]([phone]) left voicemail notifying patient that labs are normal except for mildly elevated cholesterol. Advised to begin a low fat low carb diet, exercise regularly. Recheck lipids(cholesterol) in six months. Call [facility] at [phone] with any questions or concerns. Maryjean Mornempestt S Cristian Grieves, CMA

## 2019-01-26 ENCOUNTER — Encounter (INDEPENDENT_AMBULATORY_CARE_PROVIDER_SITE_OTHER): Payer: Self-pay | Admitting: Nurse Practitioner

## 2019-01-26 ENCOUNTER — Ambulatory Visit (INDEPENDENT_AMBULATORY_CARE_PROVIDER_SITE_OTHER): Payer: Self-pay | Admitting: Nurse Practitioner

## 2019-01-26 ENCOUNTER — Other Ambulatory Visit: Payer: Self-pay

## 2019-01-26 VITALS — BP 115/81 | HR 72 | Temp 97.8°F | Ht 60.0 in | Wt 139.8 lb

## 2019-01-26 DIAGNOSIS — G44209 Tension-type headache, unspecified, not intractable: Secondary | ICD-10-CM

## 2019-01-26 NOTE — Patient Instructions (Signed)
Cefalea tensional en los adultos  Tension Headache, Adult  La cefalea tensional es un dolor o una presin que se siente en la cabeza. Las cefaleas tensionales pueden durar de 30 minutos a varios das.  Siga estas indicaciones en su casa:  Control del dolor   Tome los medicamentos de venta libre y los recetados solamente como se lo haya indicado el mdico.   Cuando tenga una cefalea, acustese en una habitacin oscura y silenciosa.   Si se lo indican, aplquese hielo en la cabeza y el cuello:  ? Ponga el hielo en una bolsa plstica.  ? Coloque una toalla entre la piel y la bolsa de hielo.  ? Coloque el hielo durante 20minutos, 2 o 3veces por da.   Si se lo indican, aplquese calor en la nuca. Hgalo con la frecuencia que le haya dicho el mdico. Use el tipo de calor que el mdico le recomiende, como una compresa de calor hmedo o una almohadilla trmica.  ? Coloque una toalla entre la piel y la fuente de calor.  ? Aplique el calor durante 20 a 30minutos.  ? Retire la fuente de calor si la piel se pone de color rojo brillante.  Comida y bebida   Mantenga un horario para las comidas.   Controle la cantidad de alcohol que bebe:  ? Si es mujer y no est embarazada, no consuma ms de 1 bebida por da.  ? Si es hombre, no consuma ms de 2 bebidas por da.   Beba suficiente lquido para mantener el pis (orina) de color amarillo plido.   No consuma mucha cafena ni deje de consumirla por completo.  Estilo de vida   Duerma lo suficiente. Duerma entre 7y 9horas todas las noches. O duerma la cantidad de tiempo que le indique el mdico.   A la hora de dormir, retire todos los dispositivos electrnicos de su habitacin. Algunos ejemplos de dispositivos electrnicos son las computadoras, los telfonos y las tabletas.   Encuentre maneras de reducir el estrs. Las siguientes son algunas cosas que pueden reducir el estrs:  ? Actividad fsica.  ? Respiracin profunda.  ? Yoga.  ? Msica.  ? Pensamientos  positivos.   Sintese con la espalda recta. No contraiga (tensione) los msculos.   No consuma ningn producto que contenga nicotina o tabaco, como cigarrillos y cigarrillos electrnicos. Si necesita ayuda para dejar de fumar, consulte al mdico.  Instrucciones generales     Concurra a todas las visitas de control como se lo haya indicado el mdico. Esto es importante.   Evite las cosas que puedan provocar las cefaleas. Lleve un registro diario para averiguar si ciertas cosas provocan los dolores de cabeza. Registre, por ejemplo, lo siguiente:  ? Lo que usted come y bebe.  ? Cunto tiempo duerme.  ? Algn cambio en su dieta o en los medicamentos.  Comunquese con un mdico si:   La cefalea no se alivia.   La cefalea regresa.   Tiene una cefalea y le molestan los sonidos, la luz o los olores.   Tiene malestar estomacal (nuseas) o vomita.   Le duele el estmago.  Solicite ayuda de inmediato si:   Siente un dolor de cabeza muy intenso de repente junto con alguno de estos sntomas:  ? Rigidez en el cuello.  ? Ganas de vomitar.  ? Vmitos.  ? Sensacin de debilidad.  ? Dificultad para ver.  ? Falta de aire.  ? Erupcin cutnea.  ? Somnolencia inusual.  ? Dificultad   para hablar.  ? Dolor en el ojo o el odo.  ? Dificultad para caminar o mantener el equilibrio.  ? Sensacin de que se desvanecer (se desmayar).  ? Desmayo.  Resumen   La cefalea tensional es un dolor o una presin que se siente en la cabeza.   Las cefaleas tensionales pueden durar de 30 minutos a varios das.   Los cambios en el estilo de vida y los medicamentos pueden ayudar a aliviar el dolor.  Esta informacin no tiene como fin reemplazar el consejo del mdico. Asegrese de hacerle al mdico cualquier pregunta que tenga.  Document Released: 03/08/2012 Document Revised: 07/20/2017 Document Reviewed: 07/20/2017  Elsevier Interactive Patient Education  2019 Elsevier Inc.

## 2019-01-26 NOTE — Progress Notes (Signed)
Assessment & Plan:  Diagnoses and all orders for this visit:  Tension headache -     DG Cervical Spine Complete; Future Continue medications as prescribed.   Patient has been counseled on age-appropriate routine health concerns for screening and prevention. These are reviewed and up-to-date. Referrals have been placed accordingly. Immunizations are up-to-date or declined.    Subjective:  VRI was used to communicate directly with patient for the entire encounter including providing detailed patient instructions.    HPI Leslie Hess 38 y.o. Hess presents to office today for follow up to headache. Leslie Hess was prescribed naproxen, baclofen and duloxetine at her last appointment with her PCP on 12-15-2018. As Leslie Hess continued to have persistent breakthrough headaches her cymbalta was increased from 30 mg to 60 mg. Leslie Hess has a history of postpartum depression, prediabetes and headaches have been occurring for several months now.  Today Leslie Hess endorses a moderate pressure sensation radiating from the lower posterior neck into the occipital area of the scalp. Leslie Hess denies any sharp or stabbing headaches.  Symptoms are occurring intermittently with no aggravating or relieving factors and subsides on its own. Leslie Hess is currently not taking Cymbalta, naproxen or baclofen. Leslie Hess denies numbness, tingling,visual disturbances,  nausea or vomiting.    Review of Systems  Constitutional: Negative for fever, malaise/fatigue and weight loss.  HENT: Negative.  Negative for nosebleeds.   Eyes: Negative.  Negative for blurred vision, double vision and photophobia.  Respiratory: Negative.  Negative for cough and shortness of breath.   Cardiovascular: Negative.  Negative for chest pain, palpitations and leg swelling.  Gastrointestinal: Negative.  Negative for heartburn, nausea and vomiting.  Musculoskeletal: Positive for neck pain. Negative for myalgias.  Neurological: Positive for headaches. Negative for dizziness,  focal weakness and seizures.  Psychiatric/Behavioral: Negative.  Negative for suicidal ideas.    Past Medical History:  Diagnosis Date  . Medical history non-contributory     Past Surgical History:  Procedure Laterality Date  . NO PAST SURGERIES      Family History  Problem Relation Age of Onset  . Diabetes Mother   . Hypertension Mother   . Diabetes Maternal Grandmother     Social History Reviewed with no changes to be made today.   Outpatient Medications Prior to Visit  Medication Sig Dispense Refill  . baclofen (LIORESAL) 10 MG tablet Take 1 tablet (10 mg total) by mouth at bedtime. (Patient not taking: Reported on 12/15/2018) 10 each 0  . DULoxetine (CYMBALTA) 60 MG capsule Take 1 capsule (60 mg total) by mouth daily. 30 capsule 3  . naproxen (NAPROSYN) 500 MG tablet Take 1 tablet (500 mg total) by mouth 2 (two) times daily as needed. (Patient not taking: Reported on 12/15/2018) 30 tablet 0   No facility-administered medications prior to visit.     Allergies  Allergen Reactions  . Fish Allergy Itching    Blisters on skin       Objective:    There were no vitals taken for this visit. Wt Readings from Last 3 Encounters:  12/15/18 137 lb 3.2 oz (62.2 kg)  11/03/18 139 lb 3.2 oz (63.1 kg)  07/07/17 145 lb (65.8 kg)    Physical Exam Vitals signs and nursing note reviewed.  Constitutional:      Appearance: Leslie Hess is well-developed.  HENT:     Head: Normocephalic and atraumatic.     Right Ear: Hearing, ear canal and external ear normal. A middle ear effusion is present.     Left Ear:  Hearing, ear canal and external ear normal. A middle ear effusion is present.     Nose:     Right Turbinates: Swollen and pale.     Left Turbinates: Swollen and pale.  Neck:     Musculoskeletal: Normal range of motion.  Cardiovascular:     Rate and Rhythm: Normal rate and regular rhythm.     Heart sounds: Normal heart sounds. No murmur. No friction rub. No gallop.   Pulmonary:      Effort: Pulmonary effort is normal. No tachypnea or respiratory distress.     Breath sounds: Normal breath sounds. No decreased breath sounds, wheezing, rhonchi or rales.  Chest:     Chest wall: No tenderness.  Abdominal:     General: Bowel sounds are normal.     Palpations: Abdomen is soft.  Musculoskeletal: Normal range of motion.  Skin:    General: Skin is warm and dry.  Neurological:     General: No focal deficit present.     Mental Status: Leslie Hess is alert and oriented to person, place, and time.     Motor: Motor function is intact.     Coordination: Coordination is intact. Coordination normal.  Psychiatric:        Behavior: Behavior normal. Behavior is cooperative.        Thought Content: Thought content normal.        Judgment: Judgment normal.          Patient has been counseled extensively about nutrition and exercise as well as the importance of adherence with medications and regular follow-up. The patient was given clear instructions to go to ER or return to medical center if symptoms don't improve, worsen or new problems develop. The patient verbalized understanding.   Follow-up: No follow-ups on file.   Claiborne RiggZelda W , FNP-BC [facility] [place], [place] [phone]   01/26/2019, 9:12 AM

## 2019-01-27 ENCOUNTER — Ambulatory Visit (HOSPITAL_COMMUNITY)
Admission: RE | Admit: 2019-01-27 | Discharge: 2019-01-27 | Disposition: A | Discharge status: Home / Self Care | Payer: Self-pay | Source: Ambulatory Visit | Attending: Nurse Practitioner | Admitting: Nurse Practitioner

## 2019-01-27 DIAGNOSIS — G44209 Tension-type headache, unspecified, not intractable: Secondary | ICD-10-CM | POA: Insufficient documentation

## 2019-01-27 IMAGING — DX DG CERVICAL SPINE COMPLETE 4+V
6 series · 6 of 6 positions shown · non-contrast
Comparison: None.

CLINICAL DATA: Posterior neck pain and headaches for 6 months.

EXAM:
CERVICAL SPINE - COMPLETE 4+ VIEW

[c-spine lat]
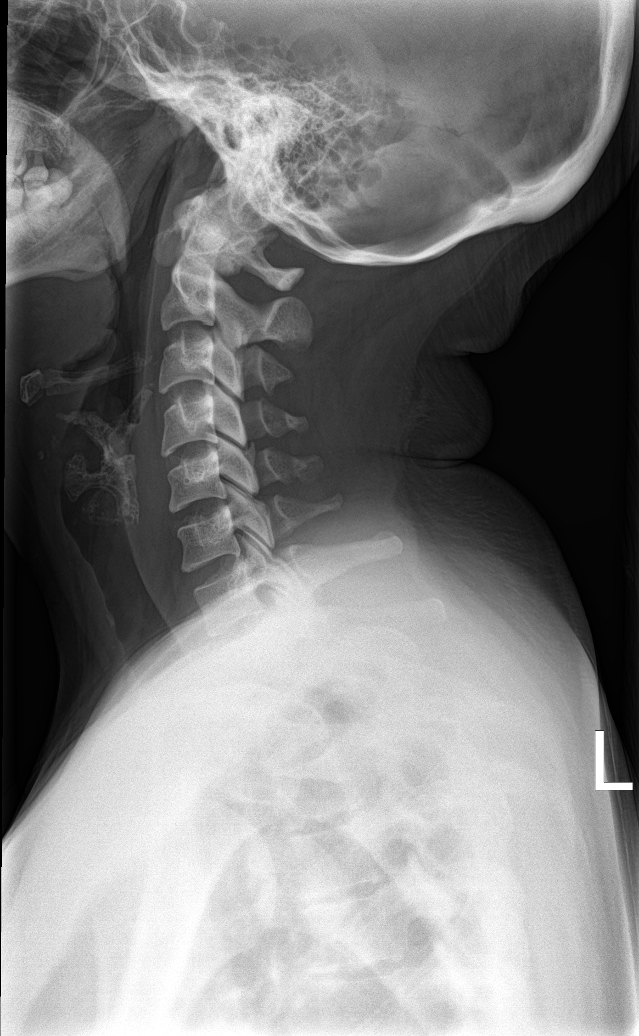

[c-spine obl (1 of 2)]
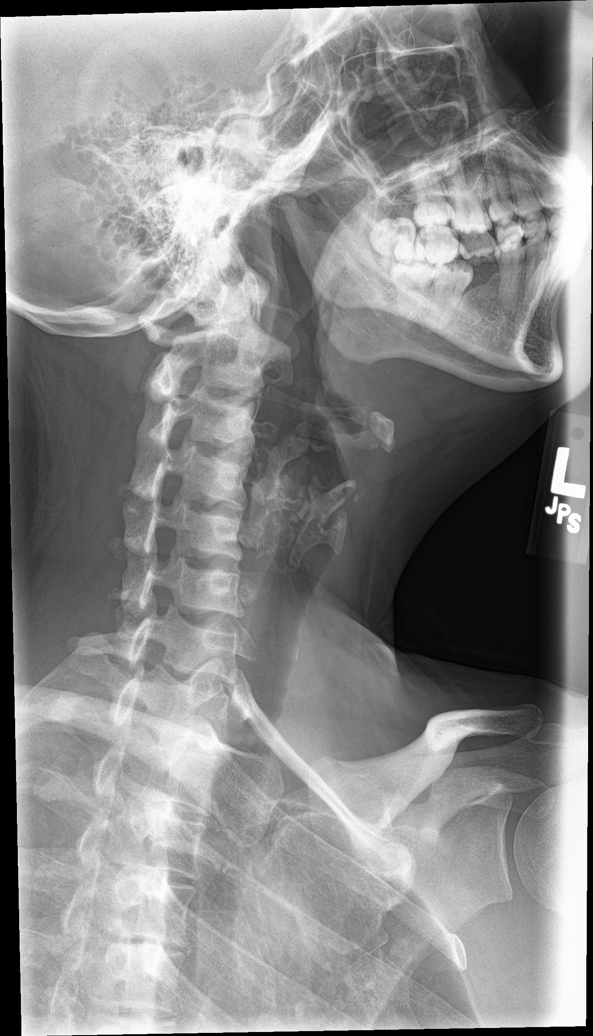

[c-spine open mouth]
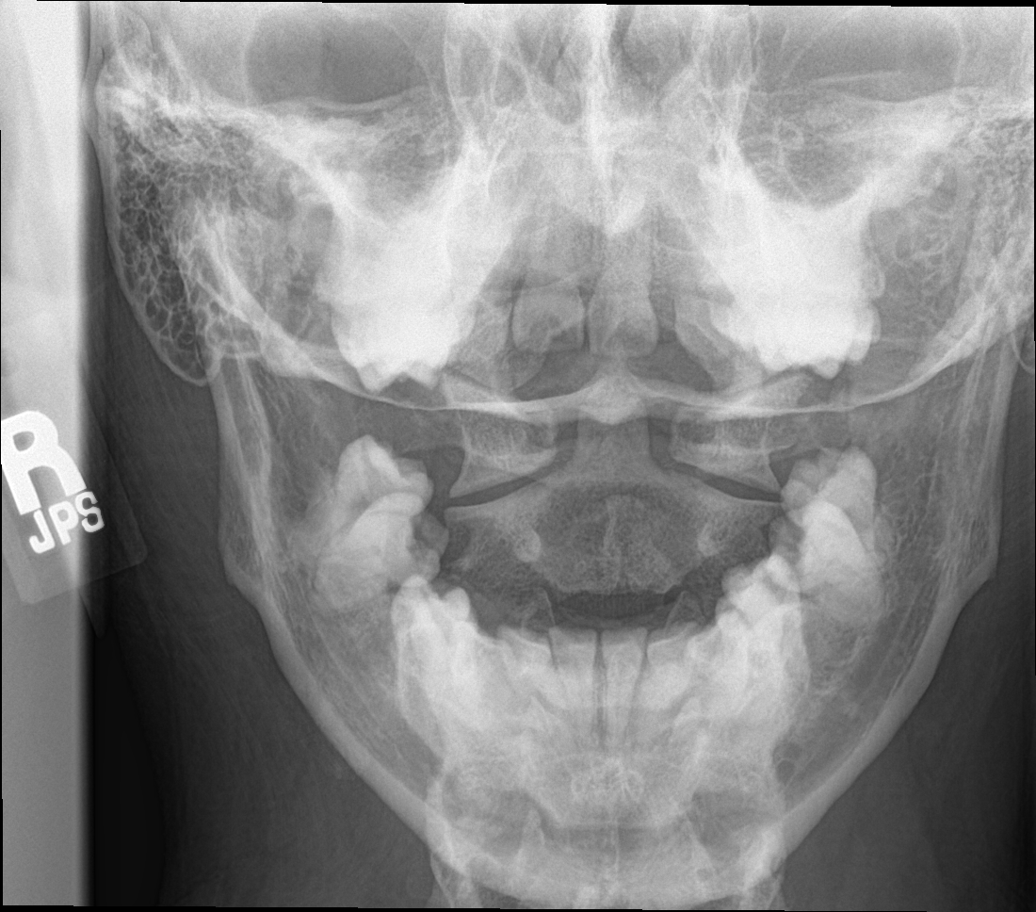

[t-spine swimmers]
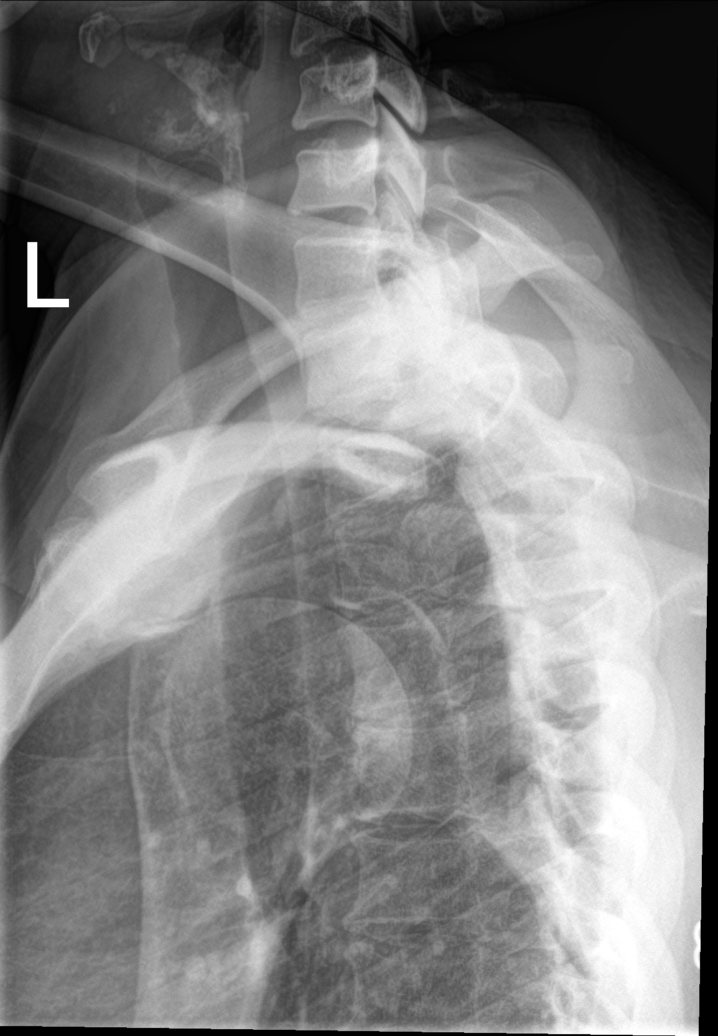

[c-spine obl (2 of 2)]
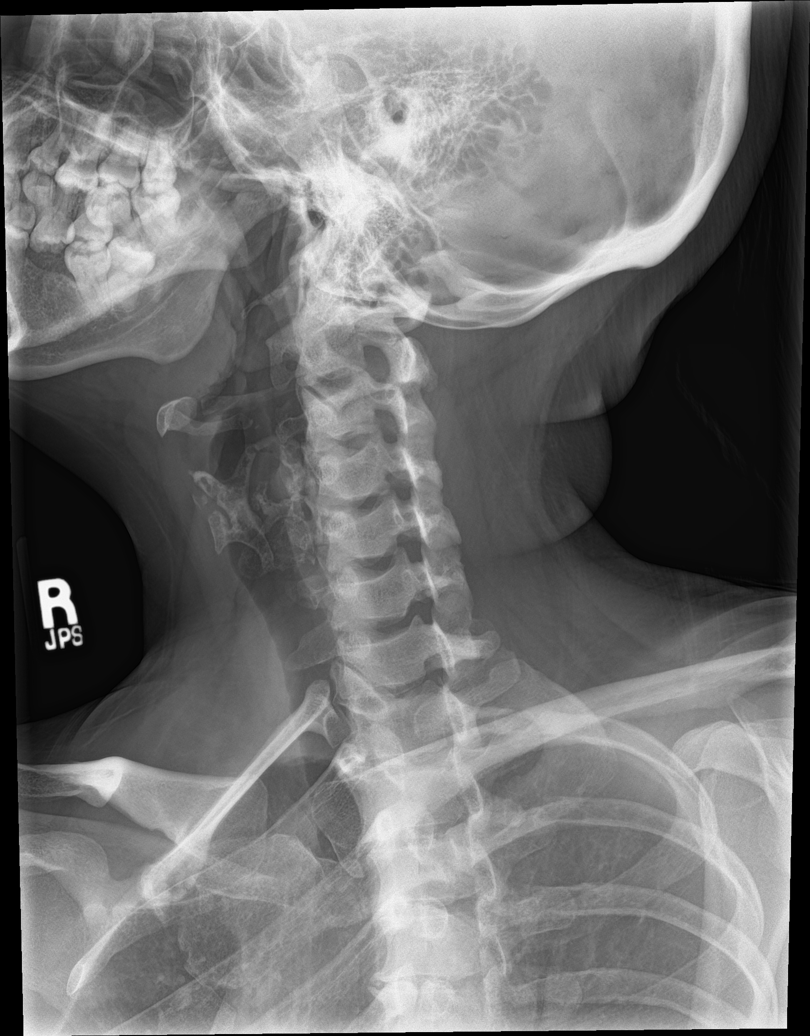

[c-spine ap]
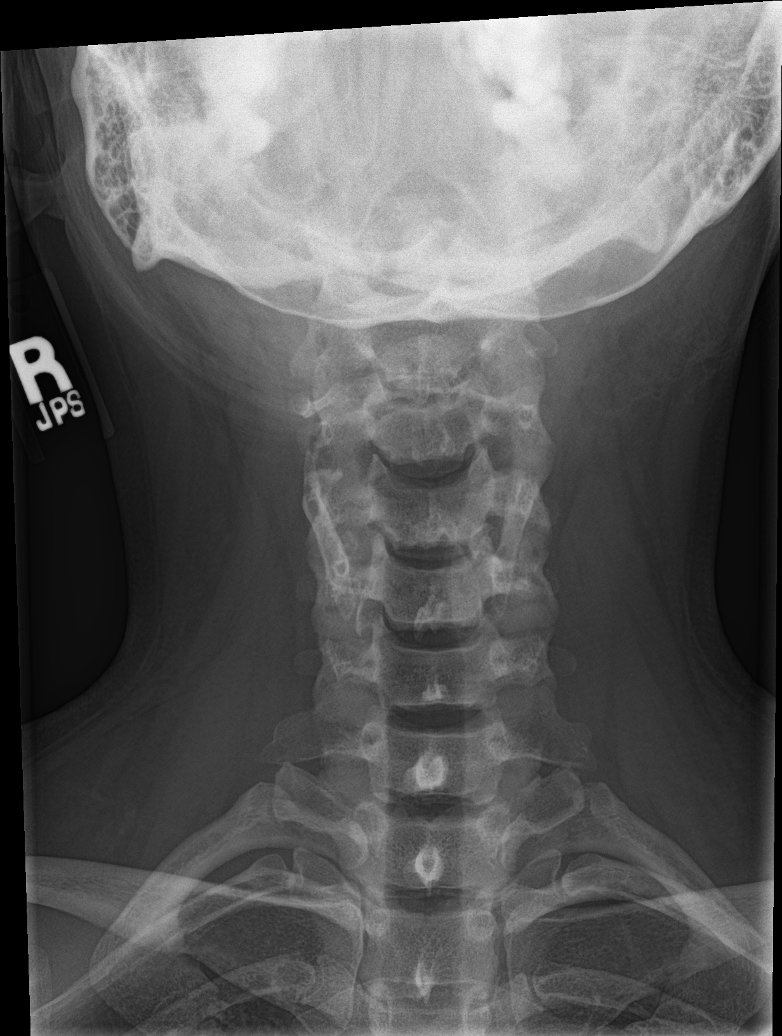

[6 of 6 positions shown; findings below may reference images not displayed]

FINDINGS: There is no evidence of cervical spine fracture or prevertebral soft
tissue swelling. Alignment is normal. Intervertebral disc space
height is normal. No other significant bone abnormalities are
identified.
IMPRESSION: Negative cervical spine radiographs.

## 2019-01-28 ENCOUNTER — Telehealth (INDEPENDENT_AMBULATORY_CARE_PROVIDER_SITE_OTHER): Payer: Self-pay

## 2019-01-28 NOTE — Telephone Encounter (Signed)
Call placed using pacific interpreter Nelva Bush O([phone]) left voicemail notifying patient that her Xray does not show any nerve damage or joint inflammation. Return call to [facility] at [phone] with any questions or concerns. Maryjean Morn, CMA

## 2019-01-28 NOTE — Telephone Encounter (Signed)
-----   Message from Claiborne Rigg, NP sent at 01/27/2019 10:16 PM EST ----- Xray of cervical spine does not show any nerve damage or joint inflammation

## 2019-02-18 ENCOUNTER — Ambulatory Visit (INDEPENDENT_AMBULATORY_CARE_PROVIDER_SITE_OTHER): Payer: Self-pay

## 2019-03-10 ENCOUNTER — Other Ambulatory Visit: Payer: Self-pay

## 2019-03-10 ENCOUNTER — Ambulatory Visit (INDEPENDENT_AMBULATORY_CARE_PROVIDER_SITE_OTHER): Payer: Self-pay | Admitting: Primary Care

## 2019-04-29 ENCOUNTER — Telehealth: Payer: Self-pay | Admitting: Physician Assistant

## 2019-04-29 NOTE — Telephone Encounter (Signed)
Pt called in wanting know when she will get her medication for headaches was going to be prescribed stated that day of her appointment the system was down and she would call her back when sytem was back up never received a call please follow up

## 2019-05-04 NOTE — Telephone Encounter (Signed)
Patient was instructed to continue with current  medication which was cymbalta. Patient needs to schedule a tele visit to discuss concerns. Last seen 01/26/2019 and has not establish with new PCP.

## 2019-05-09 ENCOUNTER — Ambulatory Visit: Payer: Self-pay | Attending: Primary Care | Admitting: Primary Care

## 2019-05-09 ENCOUNTER — Ambulatory Visit: Payer: Self-pay | Admitting: Primary Care

## 2019-05-09 ENCOUNTER — Encounter: Payer: Self-pay | Admitting: Primary Care

## 2019-05-09 ENCOUNTER — Other Ambulatory Visit: Payer: Self-pay

## 2019-05-09 DIAGNOSIS — G44209 Tension-type headache, unspecified, not intractable: Secondary | ICD-10-CM

## 2019-05-09 DIAGNOSIS — Z760 Encounter for issue of repeat prescription: Secondary | ICD-10-CM

## 2019-05-09 DIAGNOSIS — F430 Acute stress reaction: Secondary | ICD-10-CM

## 2019-05-09 DIAGNOSIS — Z0000 Encounter for general adult medical examination without abnormal findings: Secondary | ICD-10-CM

## 2019-05-09 MED ORDER — IBUPROFEN 600 MG PO TABS: 600.0000 mg | ORAL_TABLET | Freq: Three times a day (TID) | ORAL | 1 refills | Status: DC | PRN

## 2019-05-09 MED ORDER — DULOXETINE HCL 60 MG PO CPEP: 60.0000 mg | ORAL_CAPSULE | Freq: Every day | ORAL | 3 refills | Status: DC

## 2019-05-09 MED FILL — DULoxetine HCL 60 MG CPEP: 60 | 30 days supply | Qty: 30 | Fill #0

## 2019-05-09 MED FILL — IBUPROFEN 600 MG TABLET: 600 | 30 days supply | Qty: 90 | Fill #0

## 2019-05-09 NOTE — Progress Notes (Signed)
Virtual Visit via Telephone Note  I connected with Leslie Hess on 05/09/19 at  1:50 PM EDT by telephone and verified that I am speaking with the correct person using two identifiers.   I discussed the limitations, risks, security and privacy concerns of performing an evaluation and management service by telephone and the availability of in person appointments. I also discussed with the patient that there may be a patient responsible charge related to this service. The patient expressed understanding and agreed to proceed.   History of Present Illness: Leslie Hess she has a past medical history of postpartum depression, prediabetes, right foot fracture, and right fibula fracture. Today she c/o  tension type headache.    Observations/Objective: Review of Systems  Constitutional: Positive for chills.  HENT: Negative.   Eyes: Negative.   Respiratory: Negative.   Cardiovascular: Negative.   Gastrointestinal: Negative.   Genitourinary: Negative.   Musculoskeletal: Negative.   Skin: Negative.   Neurological: Positive for weakness and headaches.  Endo/Heme/Allergies: Negative.     Assessment and Plan: Leslie Hess was seen today for medication refill and establish care.  Diagnoses and all orders for this visit:  Tension-type headache, not intractable, unspecified chronicity pattern  Increased bupropion to 600 mg for h/a to reduce frequency of taking medication and help improve pain  Stress reaction, acute Dual purpose  -     DULoxetine (CYMBALTA) 60 MG capsule; Take 1 capsule (60 mg total) by mouth daily.  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      Follow Up Instructions:    I discussed the assessment and treatment plan with the patient. The patient was provided an  opportunity to ask questions and all were answered. The patient agreed with the plan and demonstrated an understanding of the instructions.   The patient was advised to call back or seek an in-person evaluation if the symptoms worsen or if the condition fails to improve as anticipated.  I provided 20 minutes of non-face-to-face time during this encounter.   Grayce Sessions, NP

## 2019-05-09 NOTE — Progress Notes (Signed)
Today visit is about medication refills and est care. Per pt she was informed by Dr. Lily Kocher that he would prescribe Ibuprofen   Per pt she is having a mild headaches.

## 2021-02-26 ENCOUNTER — Other Ambulatory Visit: Payer: Self-pay | Admitting: Physician Assistant

## 2021-02-28 MED FILL — NAPROXEN 500 MG TABLET: 500 | 10 days supply | Qty: 20 | Fill #0

## 2021-02-28 MED FILL — CYCLOBENZAPRINE 10 MG TAB: 10 | 10 days supply | Qty: 10 | Fill #0

## 2021-11-05 ENCOUNTER — Ambulatory Visit (INDEPENDENT_AMBULATORY_CARE_PROVIDER_SITE_OTHER): Payer: Self-pay | Admitting: Primary Care

## 2021-11-13 ENCOUNTER — Encounter (INDEPENDENT_AMBULATORY_CARE_PROVIDER_SITE_OTHER): Payer: Self-pay | Admitting: Primary Care

## 2021-11-13 ENCOUNTER — Other Ambulatory Visit: Payer: Self-pay

## 2021-11-13 ENCOUNTER — Ambulatory Visit (INDEPENDENT_AMBULATORY_CARE_PROVIDER_SITE_OTHER): Payer: Self-pay | Admitting: Primary Care

## 2021-11-13 VITALS — BP 123/86 | HR 90 | Temp 97.5°F | Ht 60.0 in | Wt 155.0 lb

## 2021-11-13 DIAGNOSIS — Z124 Encounter for screening for malignant neoplasm of cervix: Secondary | ICD-10-CM

## 2021-11-13 DIAGNOSIS — G44209 Tension-type headache, unspecified, not intractable: Secondary | ICD-10-CM

## 2021-11-13 MED ORDER — IBUPROFEN 400 MG PO TABS
400.0000 mg | ORAL_TABLET | Freq: Three times a day (TID) | ORAL | 0 refills | Status: AC | PRN
  Filled 2021-11-13: qty 90, 30d supply, fill #0

## 2021-11-13 MED ORDER — ACETAMINOPHEN 500 MG PO TABS
500.0000 mg | ORAL_TABLET | Freq: Four times a day (QID) | ORAL | 1 refills | Status: DC | PRN
  Filled 2021-11-13: qty 30, 8d supply, fill #0

## 2021-11-13 NOTE — Progress Notes (Signed)
Pt states she sometimes feels faint from headaches

## 2021-11-13 NOTE — Progress Notes (Signed)
  [facility]     Subjective:    [name] is a 40 y.o. Hispanic female(interpreter Victorino Dike [phone])  who presents for evaluation of headache. Symptoms began about 2 years ago. Generally, the headaches last about 5 hours and occur every evening, every morning, and several times per week. The headaches do not seem to be related to any time of day or year. The headaches are usually throbbing and are located in frontal .  The patient rates her most severe headaches a 6 on a scale from 1 to 10. Recently, the headaches have been increasing in frequency. Work attendance or other daily activities are not affected by the headaches. Precipitating factors include: light. The headaches are usually not preceded by an aura. Associated neurologic symptoms: depression. The patient denies decreased physical activity, dizziness, loss of balance, muscle weakness, numbness of extremities, speech difficulties, vision problems, and vomiting in the early morning. Home treatment has included acetaminophen and ibuprofen with some improvement. Other history includes:  strees . Family history includes no known family members with significant headaches. Event changes in her life - husband had a CVA now she's been caring for the family and over whelming.   The following portions of the patient's history were reviewed and updated as appropriate: allergies, current medications, past family history, past medical history, past social history, and past surgical history.  Review of Systems Pertinent items noted in HPI and remainder of comprehensive ROS otherwise negative.    Objective:    BP 123/86 (BP Location: Right Arm, Patient Position: Sitting, Cuff Size: Normal)   Pulse 90   Temp (!) 97.5 F (36.4 C) (Temporal)   Ht 5' (1.524 m)   Wt 155 lb (70.3 kg)   LMP 11/11/2021   SpO2 98%   BMI 30.27 kg/m  General appearance: alert, cooperative, and mildly obese Head: Normocephalic, without  obvious abnormality, atraumatic Eyes: conjunctivae/corneas clear. PERRL, EOM's intact. Fundi benign. Ears: normal TM's and external ear canals both ears Neck: no adenopathy, no carotid bruit, no JVD, supple, symmetrical, trachea midline, and thyroid not enlarged, symmetric, no tenderness/mass/nodules Back: symmetric, no curvature. ROM normal. No CVA tenderness. Lungs: clear to auscultation bilaterally Heart: regular rate and rhythm, S1, S2 normal, no murmur, click, rub or gallop Abdomen: soft, non-tender; bowel sounds normal; no masses,  no organomegaly Extremities: extremities normal, atraumatic, no cyanosis or edema Skin: Skin color, texture, turgor normal. No rashes or lesions Lymph nodes: Cervical, supraclavicular, and axillary nodes normal. Neurologic: Alert and oriented X 3, normal strength and tone. Normal symmetric reflexes. Normal coordination and gait    Assessment:  [name] was seen today for headache.  Diagnoses and all orders for this visit:  Tension-type headache, not intractable, unspecified chronicity pattern Lie in darkened room and apply cold packs as needed for pain. Side effect profile discussed in detail. Asked to keep headache diary. Treatment for pain would be NSAIDs and Tylenol  Cervical cancer screening Patient stated she had Pap done on [facility] Due July 2023 unable to verify address 1001 will have patient to keep appointment and ask for release of records to put in her chart  Other orders -     ibuprofen (ADVIL) 400 MG tablet; Take 1 tablet (400 mg total) by mouth every 8 (eight) hours as needed. -     acetaminophen (TYLENOL) 500 MG tablet; Take 1 tablet (500 mg total) by mouth every 6 (six) hours as needed.     Marcelino Duster Bertel Venard/NP's

## 2021-11-13 NOTE — Patient Instructions (Addendum)
Para dolor de cabeza tomar uno Tylenol (500mg ) con 1 de ibuprofeno (400mg ) con comida cada 8 hrs segu'n lo necesite para el dolar. Gripe en los adultos Influenza, Adult A la gripe tambin se la conoce como "influenza". Es una , la nariz y la garganta (vas respiratorias). Se transmite fcilmente de persona a persona (es contagiosa). La gripe causa sntomas que son de un resfro, junto con fiebre alta y dolores corporales. Cules son las causas? La causa de esta afeccin es el virus de la influenza. Puede contraer el virus de las siguientes maneras: Al inhalar gotitas que quedan en el aire despus de que una persona infectada con gripe tosi o estornud. Al tocar algo que est contaminado con el virus y [employer]  mano a la boca, la nariz o los ojos. Qu incrementa el riesgo? Hay ciertas cosas que lo pueden hacer ms propenso a [employer]. Estas incluyen lo siguiente: No lavarse las manos con frecuencia. Tener contacto cercano con [facility] durante la temporada de resfro y gripe. Tocarse la boca, los ojos o la nariz sin antes lavarse las manos. No recibir la Warden/ranger. Puede correr un mayor riesgo de [name] gripe, y [place] graves, como una infeccin pulmonar (neumona), si usted: Es mayor de 65 aos de edad. Est embarazada. Tiene debilitado el sistema que combate las defensas (sistema inmunitario) debido a una enfermedad o a que toma determinados medicamentos. Tiene una afeccin a largo plazo (crnica), como las siguientes: Enfermedad cardaca, renal o pulmonar. Diabetes. Asma. Tiene un trastorno heptico. Tiene mucho sobrepeso (obesidad [place]). Tiene anemia. Cules son los signos o sntomas? Los sntomas normalmente comienzan de repente y [place] [address] y [address]. Pueden incluir los siguientes: Armando Reichert y escalofros. Dolores de [place], dolores en el cuerpo o dolores musculares. Dolor de  Grant Ruts. Tos. Secrecin o congestin nasal. Molestias en el pecho. No querer comer tanto como lo hace normalmente. Sensacin de debilidad o cansancio. Mareos. Malestar estomacal o vmitos. Cmo se trata? Si la gripe se detecta de forma temprana, puede recibir tratamiento con medicamentos antivirales. Esto puede ayudar a reducir la gravedad y la duracin de la enfermedad. Se los administrarn por boca o a travs de un tubo (catter) intravenoso. Cuidarse en su hogar puede ayudar a que mejoren los sntomas. El mdico puede recomendarle lo siguiente: Tomar medicamentos de [place]. Beber abundante lquido. La gripe suele desaparecer sola. Si tiene sntomas muy graves u otros problemas, puede recibir tratamiento en un hospital. Siga estas instrucciones en su casa:   Actividad Descanse todo lo que sea necesario. Duerma lo suficiente. Advertising copywriter en su casa y no concurra al Sales promotion account executive o a la escuela, como se lo haya indicado el mdico. No salga de su casa hasta que no haya tenido fiebre por 24 horas sin tomar medicamentos. Salga de su casa solamente para ir al Lanny Hurst. Comida y bebida Aleen Campi SRO (solucin de rehidratacin oral). Es [employer] bebida que se vende en farmacias y tiendas. Beba suficiente lquido como para Neomia Dear la orina de color amarillo plido. En la medida en que pueda, beba lquidos transparentes en pequeas cantidades. Los lquidos transparentes son, por ejemplo: 10-01-1978. Trocitos de hielo. Jugo de frutas mezclado con agua. Bebidas deportivas de bajas caloras. Coma alimentos suaves que sean fciles de digerir. En la medida que pueda, consuma pequeas cantidades. Estos alimentos incluyen: Bananas. Pur de Pharmacologist. Arroz. Carnes magras. Tostadas. Galletas. No coma ni beba lo siguiente: Lquidos con Westley Hummer de  azcar o cafena. Alcohol. Alimentos condimentados o con alto contenido de [place] Indicaciones generales Use los medicamentos de venta libre y los recetados  solamente como se lo haya indicado el mdico. Use un humidificador de aire fro para que el aire de su casa est ms hmedo. Esto puede facilitar la respiracin. Cuando utilice un humidificador de vapor fro, lmpielo a diario. Vace el agua y [place] por agua limpia. Al toser o estornudar, cbrase la boca y la [place]. Lvese las manos frecuentemente con agua y [place] y durante al menos 20 segundos. Esto tambin es importante despus de toser o Engineering geologist. Si no dispone de [place] y [place], use desinfectante para manos con alcohol. Cumpla con todas las visitas de seguimiento. Cmo se previene?  Colquese la vacuna antigripal todos los [place]. Puede colocarse la vacuna contra la gripe a fines de verano, en otoo o en invierno. Pregntele al mdico cundo debe aplicarse la vacuna contra la gripe. Evite el contacto con personas que estn enfermas durante el otoo y el invierno. Es la temporada del resfro y Emergency planning/management officer. Comunquese con un mdico si: Tiene sntomas nuevos. Tiene los siguientes sntomas: Dolor de [place]. Materia fecal lquida (diarrea). Grant Ruts. La tos empeora. Empieza a tener ms mucosidad. Tiene Programme researcher, broadcasting/film/video. Vomita. Solicite ayuda de inmediato si: Le falta el aire. Tiene dificultad para respirar. La piel o las uas se ponen de un color azulado. Presenta dolor muy intenso o rigidez en el cuello. Tiene dolor de cabeza repentino. Le duele la cara o el odo de forma repentina. No puede comer ni beber sin vomitar. Estos sntomas pueden representar un problema grave que constituye Radio broadcast assistant. Solicite atencin mdica de inmediato. Comunquese con el servicio de emergencias de su localidad (911 en los [place]). No espere a ver si los sntomas desaparecen. No conduzca por sus propios medios [employer]. Resumen A la gripe tambin se la conoce como "influenza". Es una [employer] , la nariz y Administrator. Se transmite fcilmente de [place] persona a  otra. Use los medicamentos de venta libre y los recetados solamente como se lo haya indicado el mdico. Aplicarse la vacuna contra la gripe todos los aos es la mejor manera de no contagiarse la gripe. Esta informacin no tiene Theme park manager el consejo del mdico. Asegrese de hacerle al mdico cualquier pregunta que tenga. Document Revised: 10/11/2020 Document Reviewed: 10/11/2020 Elsevier Patient Education  2022 [employer].

## 2021-11-18 ENCOUNTER — Other Ambulatory Visit: Payer: Self-pay

## 2021-11-28 ENCOUNTER — Other Ambulatory Visit: Payer: Self-pay

## 2021-11-28 ENCOUNTER — Ambulatory Visit: Payer: Self-pay | Attending: Primary Care

## 2022-02-04 ENCOUNTER — Other Ambulatory Visit: Payer: Self-pay

## 2022-02-04 ENCOUNTER — Ambulatory Visit (INDEPENDENT_AMBULATORY_CARE_PROVIDER_SITE_OTHER): Payer: Self-pay | Admitting: Primary Care

## 2022-02-04 ENCOUNTER — Encounter: Payer: Self-pay | Admitting: Neurology

## 2022-02-04 ENCOUNTER — Encounter (INDEPENDENT_AMBULATORY_CARE_PROVIDER_SITE_OTHER): Payer: Self-pay | Admitting: Primary Care

## 2022-02-04 VITALS — BP 121/86 | HR 84 | Temp 97.8°F | Ht 60.0 in | Wt 151.2 lb

## 2022-02-04 DIAGNOSIS — G44209 Tension-type headache, unspecified, not intractable: Secondary | ICD-10-CM

## 2022-02-04 DIAGNOSIS — Z13220 Encounter for screening for lipoid disorders: Secondary | ICD-10-CM

## 2022-02-04 DIAGNOSIS — Z23 Encounter for immunization: Secondary | ICD-10-CM

## 2022-02-04 DIAGNOSIS — N920 Excessive and frequent menstruation with regular cycle: Secondary | ICD-10-CM

## 2022-02-04 DIAGNOSIS — Z131 Encounter for screening for diabetes mellitus: Secondary | ICD-10-CM

## 2022-02-04 LAB — POCT GLYCOSYLATED HEMOGLOBIN (HGB A1C): Hemoglobin A1C: 5.6 % (ref 4.0–5.6)

## 2022-02-04 NOTE — Progress Notes (Signed)
[facility]         ,       Subjective:    Leslie Hess is a 41 y.o. 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 Symptoms began about 4  years  ago. Generally, the headaches last all day  and occur every day. The headaches  seem to be start and night than  waking up with them. The headaches are usually throbbing and are located at the back of her neck (nap) and radiates to the cranial (top of head) .  The patient rates her most severe headaches a 6 on a scale from 1 to 10. Recently, the headaches have been increasing in both severity and frequency. Work attendance or other daily activities are not affected by the headaches. Precipitating factors include: none which have been determined. The headaches are usually not preceded by an aura. Associated neurologic symptoms: loss of balance. The patient denies dizziness and loss of balance. Home treatment has included acetaminophen and ibuprofen with marked improvement. Other history includes: nothing pertinent. Family history includes no known family members with significant headaches.  The following portions of the patient's history were reviewed and updated as appropriate: allergies, current medications, past family history, past medical history, past social history, and past surgical history.  Review of Systems Pertinent items noted in HPI and remainder of comprehensive ROS otherwise negative.    Objective:  BP 121/86 (BP Location: Right Arm, Patient Position: Sitting, Cuff Size: Normal)    Pulse 84    Temp 97.8 F (36.6 C) (Oral)    Ht 5' (1.524 m)    Wt 151 lb 3.2 oz (68.6 kg)    LMP 01/04/2022 (Exact Date)    SpO2 99%    BMI 29.53 kg/m    BP 121/86 (BP Location: Right Arm, Patient Position: Sitting, Cuff Size: Normal)    Pulse 84    Temp 97.8 F (36.6 C) (Oral)    Ht 5' (1.524 m)    Wt 151 lb 3.2 oz (68.6 kg)    LMP 01/04/2022 (Exact Date)    SpO2 99%    BMI 29.53  kg/m  General appearance: alert, cooperative, and appears stated age Head: Normocephalic, without obvious abnormality, atraumatic Eyes: conjunctivae/corneas clear. PERRL, EOM's intact. Fundi benign. Ears: normal TM's and external ear canals both ears Neck: no adenopathy, no carotid bruit, no JVD, supple, symmetrical, trachea midline, and thyroid not enlarged, symmetric, no tenderness/mass/nodules Back: symmetric, no curvature. ROM normal. No CVA tenderness. Lungs: clear to auscultation bilaterally Heart: regular rate and rhythm, S1, S2 normal, no murmur, click, rub or gallop Abdomen: soft, non-tender; bowel sounds normal; no masses,  no organomegaly Extremities: extremities normal, atraumatic, no cyanosis or edema Pulses: 2+ and symmetric Skin: Skin color, texture, turgor normal. No rashes or lesions    Assessment:  [name] was seen today for headache.  Diagnoses and all orders for this visit:  Screening for diabetes mellitus -     HgB A1c 5.6  Need for Tdap vaccination -     Tdap vaccine greater than or equal to 7yo IM  Need for immunization against influenza -     Flu Vaccine QUAD 5moIM (Fluarix, Fluzone & Alfiuria Quad PF)  Tension-type headache, not intractable, unspecified chronicity pattern -     Ambulatory referral to Neurology -     CMP14+EGFR; Future  Menorrhagia with regular cycle -     CBC with Differential; Future  Lipid screening -  Lipid panel; Future  This note has been created with Surveyor, quantity. Any transcriptional errors are unintentional.  Kerin Perna, NP 2/7/202311:40 AM

## 2022-02-04 NOTE — Patient Instructions (Signed)
Gripe en los adultos °Influenza, Adult °A la gripe también se la conoce como “influenza”. Es una infección en los pulmones, la nariz y la garganta (vías respiratorias). Se transmite fácilmente de persona a persona (es contagiosa). La gripe causa síntomas que son como los de un resfrío, junto con fiebre alta y dolores corporales. °¿Cuáles son las causas? °La causa de esta afección es el virus de la influenza. Puede contraer el virus de las siguientes maneras: °Al inhalar gotitas que quedan en el aire después de que una persona infectada con gripe tosió o estornudó. °Al tocar algo que está contaminado con el virus y luego llevarse la mano a la boca, la nariz o los ojos. °¿Qué incrementa el riesgo? °Hay ciertas cosas que lo pueden hacer más propenso a tener gripe. Estas incluyen lo siguiente: °No lavarse las manos con frecuencia. °Tener contacto cercano con muchas personas durante la temporada de resfrío y gripe. °Tocarse la boca, los ojos o la nariz sin antes lavarse las manos. °No recibir la vacuna antigripal todos los años. °Puede correr un mayor riesgo de tener gripe, y problemas graves, como una infección pulmonar (neumonía), si usted: °Es mayor de 65 años de edad. °Está embarazada. °Tiene debilitado el sistema que combate las defensas (sistema inmunitario) debido a una enfermedad o a que toma determinados medicamentos. °Tiene una afección a largo plazo (crónica), como las siguientes: °Enfermedad cardíaca, renal o pulmonar. °Diabetes. °Asma. °Tiene un trastorno hepático. °Tiene mucho sobrepeso (obesidad mórbida). °Tiene anemia. °¿Cuáles son los signos o síntomas? °Los síntomas normalmente comienzan de repente y duran entre 4 y 14 días. Pueden incluir los siguientes: °Fiebre y escalofríos. °Dolores de cabeza, dolores en el cuerpo o dolores musculares. °Dolor de garganta. °Tos. °Secreción o congestión nasal. °Molestias en el pecho. °No querer comer tanto como lo hace normalmente. °Sensación de debilidad o  cansancio. °Mareos. °Malestar estomacal o vómitos. °¿Cómo se trata? °Si la gripe se detecta de forma temprana, puede recibir tratamiento con medicamentos antivirales. Esto puede ayudar a reducir la gravedad y la duración de la enfermedad. Se los administrarán por boca o a través de un tubo (catéter) intravenoso. °Cuidarse en su hogar puede ayudar a que mejoren los síntomas. El médico puede recomendarle lo siguiente: °Tomar medicamentos de venta libre. °Beber abundante líquido. °La gripe suele desaparecer sola. Si tiene síntomas muy graves u otros problemas, puede recibir tratamiento en un hospital. °Siga estas instrucciones en su casa: °  °Actividad °Descanse todo lo que sea necesario. Duerma lo suficiente. °Quédese en su casa y no concurra al trabajo o a la escuela, como se lo haya indicado el médico. °No salga de su casa hasta que no haya tenido fiebre por 24 horas sin tomar medicamentos. °Salga de su casa solamente para ir al médico. °Comida y bebida °Tome una SRO (solución de rehidratación oral). Es una bebida que se vende en farmacias y tiendas. °Beba suficiente líquido como para mantener la orina de color amarillo pálido. °En la medida en que pueda, beba líquidos transparentes en pequeñas cantidades. Los líquidos transparentes son, por ejemplo: °Agua. °Trocitos de hielo. °Jugo de frutas mezclado con agua. °Bebidas deportivas de bajas calorías. °Coma alimentos suaves que sean fáciles de digerir. En la medida que pueda, consuma pequeñas cantidades. Estos alimentos incluyen: °Bananas. °Puré de manzana. °Arroz. °Carnes magras. °Tostadas. °Galletas. °No coma ni beba lo siguiente: °Líquidos con alto contenido de azúcar o cafeína. °Alcohol. °Alimentos condimentados o con alto contenido de grasa. °Indicaciones generales °Use los medicamentos de venta libre y los recetados solamente   como se lo haya indicado el médico. °Use un humidificador de aire frío para que el aire de su casa esté más húmedo. Esto puede facilitar  la respiración. °Cuando utilice un humidificador de vapor frío, límpielo a diario. Vacíe el agua y cámbiela por agua limpia. °Al toser o estornudar, cúbrase la boca y la nariz. °Lávese las manos frecuentemente con agua y jabón y durante al menos 20 segundos. Esto también es importante después de toser o estornudar. Si no dispone de agua y jabón, use desinfectante para manos con alcohol. °Cumpla con todas las visitas de seguimiento. °¿Cómo se previene? ° °Colóquese la vacuna antigripal todos los años. Puede colocarse la vacuna contra la gripe a fines de verano, en otoño o en invierno. Pregúntele al médico cuándo debe aplicarse la vacuna contra la gripe. °Evite el contacto con personas que estén enfermas durante el otoño y el invierno. Es la temporada del resfrío y la gripe. °Comuníquese con un médico si: °Tiene síntomas nuevos. °Tiene los siguientes síntomas: °Dolor de pecho. °Materia fecal líquida (diarrea). °Fiebre. °La tos empeora. °Empieza a tener más mucosidad. °Tiene malestar estomacal. °Vomita. °Solicite ayuda de inmediato si: °Le falta el aire. °Tiene dificultad para respirar. °La piel o las uñas se ponen de un color azulado. °Presenta dolor muy intenso o rigidez en el cuello. °Tiene dolor de cabeza repentino. °Le duele la cara o el oído de forma repentina. °No puede comer ni beber sin vomitar. °Estos síntomas pueden representar un problema grave que constituye una emergencia. Solicite atención médica de inmediato. Comuníquese con el servicio de emergencias de su localidad (911 en los [place]). °No espere a ver si los síntomas desaparecen. °No conduzca por sus propios medios hasta el hospital. °Resumen °A la gripe también se la conoce como “influenza”. Es una infección en los pulmones, la nariz y la garganta. Se transmite fácilmente de una persona a otra. °Use los medicamentos de venta libre y los recetados solamente como se lo haya indicado el médico. °Aplicarse la vacuna contra la gripe todos los  años es la mejor manera de no contagiarse la gripe. °Esta información no tiene como fin reemplazar el consejo del médico. Asegúrese de hacerle al médico cualquier pregunta que tenga. °Document Revised: 10/11/2020 Document Reviewed: 10/11/2020 °Elsevier Patient Education © 2022 Elsevier Inc. °Vacuna Tdap (tétanos, difteria y tos ferina): lo que debe saber °Tdap (Tetanus, Diphtheria, Pertussis) Vaccine: What You Need to Know °1. ¿Por qué vacunarse? °La vacuna Tdap puede prevenir el tétanos, la difteria y la tos ferina. °La difteria y la tos ferina se contagian de persona a persona. El tétanos ingresa al organismo a través de cortes o heridas. °El TÉTANOS (T) provoca rigidez dolorosa en los músculos. El tétanos puede causar graves problemas de salud, como no poder abrir la boca, tener dificultad para tragar y respirar, o la muerte. °La DIFTERIA (D) puede causar dificultad para respirar, insuficiencia cardíaca, parálisis o muerte. °La TOS FERINA (aP), también conocida como “tos convulsa”, puede causar tos violenta e incontrolable lo que hace difícil respirar, comer o beber. La tos ferina puede ser muy grave, especialmente en los bebés y en los niños pequeños, y causar neumonía, convulsiones, daño cerebral o la muerte. En adolescentes y adultos, puede causar pérdida de peso, pérdida del control de la vejiga, desmayos y fracturas de costillas al toser de manera intensa. °2. Vacuna Tdap °La vacuna Tdap es solo para niños de 7 años en adelante, adolescentes y adultos.  °Los adolescentes deben recibir una dosis única de la vacuna   Tdap, preferentemente a los 11 o 12 años. °Las personas embarazadas deben recibir una dosis de la vacuna Tdap durante cada embarazo, preferiblemente durante la primera parte del tercer trimestre, para ayudar a proteger al recién nacido de la tos ferina. Los bebés tienen mayor riesgo de sufrir complicaciones graves y potencialmente mortales debido a la tos ferina. °Los adultos que nunca recibieron la  vacuna Tdap deben recibir una dosis. °Además, los adultos deben recibir una dosis de refuerzo de Tdap o Td (una vacuna diferente que protege contra el tétanos y la difteria, pero no contra la tos ferina) cada 10 años, o después de 5 años en caso de herida o quemadura grave o sucia. °La vacuna Tdap puede ser administrada al mismo tiempo que otras vacunas. °3. Hable con el médico °Comuníquese con la persona que le coloca las vacunas si la persona que la recibe: °Ha tenido una reacción alérgica después de una dosis anterior de cualquier vacuna contra el tétanos, la difteria o la tos ferina, o cualquier alergia grave, potencialmente mortal °Ha tenido un coma, disminución del nivel de la conciencia o convulsiones prolongadas dentro de los 7 días posteriores a una dosis anterior de cualquier vacuna contra la tos ferina (DTP, DTaP o Tdap) °Tiene convulsiones u otro problema del sistema nervioso °Alguna vez tuvo síndrome de Guillain-Barré (también llamado “SGB”) °Ha tenido dolor intenso o hinchazón después de una dosis anterior de cualquier vacuna contra el tétanos o la difteria °En algunos casos, es posible que el médico decida posponer la aplicación de la vacuna Tdap para una visita en el futuro. °Las personas que sufren trastornos menores, como un resfrío, pueden vacunarse. Las personas que tienen enfermedades moderadas o graves generalmente deben esperar hasta recuperarse para poder recibir la vacuna Tdap.  °Su médico puede darle más información. °4. Riesgos de una reacción a la vacuna °Después de recibir la vacuna Tdap a veces se puede tener dolor, enrojecimiento o hinchazón en el lugar donde se aplicó la inyección, fiebre leve, dolor de cabeza, sensación de cansancio y náuseas, vómitos, diarrea o dolor de estómago. °Las personas a veces se desmayan después de procedimientos médicos, incluida la vacunación. Informe al médico si se siente mareado, tiene cambios en la visión o zumbidos en los oídos.  °Al igual que con  cualquier medicamento, existe una probabilidad muy remota de que una vacuna cause una reacción alérgica grave, otra lesión grave o la muerte. °5. ¿Qué pasa si se presenta un problema grave? °Podría producirse una reacción alérgica después de que la persona vacunada abandone la clínica. Si observa signos de una reacción alérgica grave (ronchas, hinchazón de la cara y la garganta, dificultad para respirar, latidos cardíacos acelerados, mareos o debilidad), llame al 9-1-1 y lleve a la persona al hospital más cercano. °Si se presentan otros signos que le preocupan, comuníquese con su médico.  °Las reacciones adversas deben informarse al Sistema de Informe de Eventos Adversos de Vacunas (Vaccine Adverse Event Reporting System, VAERS). Por lo general, el médico presenta este informe o puede hacerlo usted mismo. Visite el sitio web del VAERS en www.vaers.hhs.gov o llame al 1-800-822-7967. El VAERS es solo para informar reacciones, y los miembros de su personal no proporcionan asesoramiento médico. °6. Programa Nacional de Compensación de Daños por Vacunas °El Programa Nacional de Compensación de Daños por Vacunas (National Vaccine Injury Compensation Program, VICP) es un programa federal que fue creado para compensar a las personas que puedan haber sufrido daños al recibir ciertas vacunas. Las reclamaciones relativas a presuntas lesiones o   muerte debidas a la vacunación tienen un límite de tiempo para su presentación, que puede ser de tan solo dos años. Visite el sitio web del VICP en www.hrsa.gov/vaccinecompensation o llame al 1-800-338-2382 para obtener más información acerca del programa y de cómo presentar un reclamo. °7. ¿Cómo puedo obtener más información? °Pregúntele a su médico. °Comuníquese con el servicio de salud de su localidad o su estado. °Visite el sitio web de la Food and Drug Administration, (FDA) (Administración de Alimentos y Medicamentos) para ver los prospectos de las vacunas e información adicional en  www.fda.gov/vaccines-blood-biologics/vaccines. °Comuníquese con los Centers for Disease Control and Prevention, CDC (Centros para el Control y la Prevención de Enfermedades): °Llame al 1-800-232-4636 (1-800-CDC-INFO) o °Visite el sitio web de los CDC en www.cdc.gov/vaccines. °Declaración de información de la vacuna Tdap (tétanos, difteria y tos ferina) (08/03/2020) °Esta información no tiene como fin reemplazar el consejo del médico. Asegúrese de hacerle al médico cualquier pregunta que tenga. °Document Revised: 10/02/2020 Document Reviewed: 09/17/2020 °Elsevier Patient Education © 2022 Elsevier Inc. ° °

## 2022-02-05 ENCOUNTER — Other Ambulatory Visit (INDEPENDENT_AMBULATORY_CARE_PROVIDER_SITE_OTHER): Payer: Self-pay

## 2022-02-05 DIAGNOSIS — G44209 Tension-type headache, unspecified, not intractable: Secondary | ICD-10-CM

## 2022-02-05 DIAGNOSIS — Z13220 Encounter for screening for lipoid disorders: Secondary | ICD-10-CM

## 2022-02-05 DIAGNOSIS — N920 Excessive and frequent menstruation with regular cycle: Secondary | ICD-10-CM

## 2022-02-06 LAB — CBC WITH DIFFERENTIAL/PLATELET
Basophils Absolute: 0 10*3/uL (ref 0.0–0.2)
Basos: 0 %
EOS (ABSOLUTE): 0.1 10*3/uL (ref 0.0–0.4)
Eos: 1 %
Hematocrit: 37.9 % (ref 34.0–46.6)
Hemoglobin: 12.3 g/dL (ref 11.1–15.9)
Immature Grans (Abs): 0 10*3/uL (ref 0.0–0.1)
Immature Granulocytes: 0 %
Lymphocytes Absolute: 1.6 10*3/uL (ref 0.7–3.1)
Lymphs: 24 %
MCH: 27.3 pg (ref 26.6–33.0)
MCHC: 32.5 g/dL (ref 31.5–35.7)
MCV: 84 fL (ref 79–97)
Monocytes Absolute: 0.5 10*3/uL (ref 0.1–0.9)
Monocytes: 7 %
Neutrophils Absolute: 4.4 10*3/uL (ref 1.4–7.0)
Neutrophils: 68 %
Platelets: 293 10*3/uL (ref 150–450)
RBC: 4.51 x10E6/uL (ref 3.77–5.28)
RDW: 13.3 % (ref 11.7–15.4)
WBC: 6.6 10*3/uL (ref 3.4–10.8)

## 2022-02-06 LAB — CMP14+EGFR
ALT: 20 IU/L (ref 0–32)
AST: 22 IU/L (ref 0–40)
Albumin/Globulin Ratio: 1.7 (ref 1.2–2.2)
Albumin: 4.4 g/dL (ref 3.8–4.8)
Alkaline Phosphatase: 82 IU/L (ref 44–121)
BUN/Creatinine Ratio: 15 (ref 9–23)
BUN: 10 mg/dL (ref 6–20)
Bilirubin Total: 0.4 mg/dL (ref 0.0–1.2)
CO2: 21 mmol/L (ref 20–29)
Calcium: 8.9 mg/dL (ref 8.7–10.2)
Chloride: 102 mmol/L (ref 96–106)
Creatinine, Ser: 0.65 mg/dL (ref 0.57–1.00)
Globulin, Total: 2.6 g/dL (ref 1.5–4.5)
Glucose: 89 mg/dL (ref 70–99)
Potassium: 3.9 mmol/L (ref 3.5–5.2)
Sodium: 138 mmol/L (ref 134–144)
Total Protein: 7 g/dL (ref 6.0–8.5)
eGFR: 115 mL/min/{1.73_m2} (ref >59)

## 2022-02-06 LAB — LIPID PANEL
Chol/HDL Ratio: 4.1 ratio (ref 0.0–4.4)
Cholesterol, Total: 176 mg/dL (ref 100–199)
HDL: 43 mg/dL (ref >39)
LDL Chol Calc (NIH): 108 mg/dL — ABNORMAL HIGH (ref 0–99)
Triglycerides: 139 mg/dL (ref 0–149)
VLDL Cholesterol Cal: 25 mg/dL (ref 5–40)

## 2022-02-07 ENCOUNTER — Telehealth (INDEPENDENT_AMBULATORY_CARE_PROVIDER_SITE_OTHER): Payer: Self-pay

## 2022-02-07 NOTE — Telephone Encounter (Signed)
-----   Message from Grayce Sessions, NP sent at 02/06/2022  2:56 PM EST ----- Labs look good. LDL slightly elevated no medication needed at this time.  Start with a healthy lifestyle diet of fruits vegetables fish nuts whole grains and low saturated fat . Decrease foods high in cholesterol or liver, fatty meats,cheese, butter avocados, nuts and seeds, chocolate and fried foods.

## 2022-02-07 NOTE — Telephone Encounter (Signed)
Call placed to patient with the assistance of pacific interpreter [phone]. Per DPR left voicemail informing patient that labs are normal except slight elevation in cholesterol. No medication needed at this time. Provided dietary advise. Asked patient to return call to [facility] at [phone] with any questions or concerns. Maryjean Morn, CMA

## 2022-04-23 NOTE — Progress Notes (Signed)
? ?NEUROLOGY CONSULTATION NOTE ? ?Leslie Hess ?MRN: [id number] ?DOB: [birth year] ? ?Referring provider: Gwinda Passe, NP ?Primary care provider: Gwinda Passe, NP ? ?Reason for consult:  headache ? ?Assessment/Plan:  ? ?Cervicogenic headache ?Bilateral occipital neuralgia ?Right sided cervicalgia ? ?Start gabapentin 100mg  titrating to 300mg  at bedtime ?Limit use of pain relievers to no more than 2 days out of week to prevent risk of rebound or medication-overuse headache. ?Will see if physical therapy for neck pain is an option ?Follow up 4 to 5 months. ? ? ?Subjective:  ?Leslie Hess is a 41 year old right-handed female who presents for headache.  History supplemented by referring provider's notes.  Interpreter present. ? ?Onset:  since 2019 after her husband had a stroke.  No injury.   ?Location:  starts from back of neck radiating to top of head ?Quality:  pressure, throbbing ?Intensity:  usually 4-5/10 but 9/10 when most severe ?Aura:  absent ?Prodrome:  absen ?Associated symptoms:  Photophobia.  Scalp tender to palpation.  Paresthesia over crown/back of head.  Right lateral neck/trapezius pain.  When severe, feels lightheaded like she may pass out but doesn't.  She denies associated nausea, vomiting, phonophobia, visual disturbance, unilateral numbness or weakness. ?Duration:  1 hour with ibuprofen.  Usually wakes up with it in the morning ?Frequency:  4-5 days a week, 1 day a week is severe ?Frequency of abortive medication: ibuprofen 1-2 days a week ?Triggers:  unknown ?Relieving factors:  sometimes moving neck helps, acetaminophen, ibuprofen ?Activity:  able to function ? ?Cervical spine X-ray on 01/27/2019 personally reviewed was normal.   ? ?Current NSAIDS/analgesics:  ibuprofen, sometimes ASA ?Current triptans:  none ?Current ergotamine:  none ?Current anti-emetic:  none ?Current muscle relaxants:  none ?Current Antihypertensive medications:  none ?Current Antidepressant  medications:  none ?Current Anticonvulsant medications:  none ?Current anti-CGRP:  none ?Current Vitamins/Herbal/Supplements:  none ?Current Antihistamines/Decongestants:  none ?Other therapy:  none ?Hormone/birth control:  none ? ?Past NSAIDS/analgesics:  naproxen, acetaminophen, tramadol ?Past abortive triptans:  none ?Past abortive ergotamine:  none ?Past muscle relaxants:  cyclobenzaprine ?Past anti-emetic:  none ?Past antihypertensive medications:  none ?Past antidepressant medications:  duloxetine ?Past anticonvulsant medications:  none ?Past anti-CGRP:  none ?Past vitamins/Herbal/Supplements:  none ?Past antihistamines/decongestants:  none ?Other past therapies:  none ? ? ?Family history of headache:  no ? ?  ? ? ?PAST MEDICAL HISTORY: ?Past Medical History:  ?Diagnosis Date  ? Medical history non-contributory   ? ? ?PAST SURGICAL HISTORY: ?Past Surgical History:  ?Procedure Laterality Date  ? NO PAST SURGERIES    ? ? ?MEDICATIONS: ?Current Outpatient Medications on File Prior to Visit  ?Medication Sig Dispense Refill  ? acetaminophen (TYLENOL) 500 MG tablet Take 1 tablet (500 mg total) by mouth every 6 (six) hours as needed. 30 tablet 1  ? ibuprofen (ADVIL) 400 MG tablet Take 1 tablet (400 mg total) by mouth every 8 (eight) hours as needed. 90 tablet 0  ? ?No current facility-administered medications on file prior to visit.  ? ? ?ALLERGIES: ?Allergies  ?Allergen Reactions  ? Fish Allergy Itching  ?  Blisters on skin  ? ? ?FAMILY HISTORY: ?Family History  ?Problem Relation Age of Onset  ? Diabetes Mother   ? Hypertension Mother   ? Diabetes Maternal Grandmother   ? ? ?Objective:  ?Blood pressure 121/77, pulse 81, height 5\' 4"  (1.626 m), weight 150 lb 12.8 oz (68.4 kg), SpO2 97 %. ?General: No acute distress.  Patient appears well-groomed.   ?Head:  Normocephalic/atraumatic ?Eyes:  fundi examined but not visualized ?Neck: supple, right paraspinal tenderness, full range of motion ?Heart: regular rate and  rhythm ?Neurological Exam: ?Mental status: alert and oriented to person, place, and time, recent and remote memory intact, fund of knowledge intact, attention and concentration intact, speech fluent and not dysarthric, language intact. ?Cranial nerves: ?CN I: not tested ?CN II: pupils equal, round and reactive to light, visual fields intact ?CN III, IV, VI:  full range of motion, no nystagmus, no ptosis ?CN V: facial sensation intact. ?CN VII: upper and lower face symmetric ?CN VIII: hearing intact ?CN IX, X: gag intact, uvula midline ?CN XI: sternocleidomastoid and trapezius muscles intact ?CN XII: tongue midline ?Bulk & Tone: normal, no fasciculations. ?Motor:  muscle strength 5/5 throughout ?Sensation:  Pinp[place], temperature and vibratory sensation intact. ?Deep Tendon Reflexes:  2+ throughout,  toes downgoing.   ?Finger to nose testing:  Without dysmetria.   ?Heel to shin:  Without dysmetria.   ?Gait:  Normal station and stride.  Romberg negative. ? ? ? ?Thank you for allowing me to take part in the care of this patient. ? ?Shon Millet, DO ? ?CC: Gwinda Passe, NP ? ? ? ? ?

## 2022-04-25 ENCOUNTER — Other Ambulatory Visit: Payer: Self-pay

## 2022-04-25 ENCOUNTER — Encounter: Payer: Self-pay | Admitting: Neurology

## 2022-04-25 ENCOUNTER — Ambulatory Visit (INDEPENDENT_AMBULATORY_CARE_PROVIDER_SITE_OTHER): Payer: Self-pay | Admitting: Neurology

## 2022-04-25 VITALS — BP 121/77 | HR 81 | Ht 64.0 in | Wt 150.8 lb

## 2022-04-25 DIAGNOSIS — G4486 Cervicogenic headache: Secondary | ICD-10-CM

## 2022-04-25 DIAGNOSIS — M5481 Occipital neuralgia: Secondary | ICD-10-CM

## 2022-04-25 DIAGNOSIS — M542 Cervicalgia: Secondary | ICD-10-CM

## 2022-04-25 MED ORDER — GABAPENTIN 100 MG PO CAPS
ORAL_CAPSULE | ORAL | 0 refills | Status: DC
  Filled 2022-04-25: qty 90, 30d supply, fill #0

## 2022-04-25 NOTE — Patient Instructions (Signed)
I think the headaches are coming from the neck ? ?Start gabapentin.  Take 1 pill at bedtime for one week, then 2 pills at bedtime for one week, then 3 pills at bedtime.  We can increase dose further if needed ?Limit use of pain relievers to no more than 2 days out of week to prevent risk of rebound or medication-overuse headache. ?Will see if we can get you physical therapy for neck pain ?Follow up 4 to 5 months. ? ? ? ? ? ?Creo que los dolores de [place] vienen del cuello. ? ?1. Iniciar gabapentina. Tome 1 pastilla a la hora de acostarse durante [place], luego 2 pastillas a la hora de acostarse durante [place], luego 3 pastillas a la hora de [place]. Podemos aumentar la dosis a?n m?s si es necesario ?2. Limite el uso de analg?sicos a no m?s de 2 d?as a la semana para evitar el riesgo de dolor de cabeza de rebote o por uso excesivo de medicamentos. ?3. Ver? si podemos conseguirle fisioterapia para el dolor de cuello. ?4. Seguimiento de 4 a 5 meses. ?

## 2022-04-28 ENCOUNTER — Other Ambulatory Visit: Payer: Self-pay

## 2022-08-25 NOTE — Progress Notes (Unsigned)
NEUROLOGY FOLLOW UP OFFICE NOTE  Leslie Hess [id number]  Assessment/Plan:   Cervicogenic headache Bilateral occipital neuralgia Right sided cervicalgia   Start gabapentin 100mg  titrating to 300mg  at bedtime Limit use of pain relievers to no more than 2 days out of week to prevent risk of rebound or medication-overuse headache. Will see if physical therapy for neck pain is an option Follow up 4 to 5 months.     Subjective:  Leslie Hess is a 41 year old right-handed female who follows up for headache.  UPDATE: Started gabapentin in April. ***  Current NSAIDS/analgesics:  ibuprofen, sometimes ASA Current triptans:  none Current ergotamine:  none Current anti-emetic:  none Current muscle relaxants:  none Current Antihypertensive medications:  none Current Antidepressant medications:  none Current Anticonvulsant medications:  gabapentin 300mg  QHS Current anti-CGRP:  none Current Vitamins/Herbal/Supplements:  none Current Antihistamines/Decongestants:  none Other therapy:  none Hormone/birth control:  none   HISTORY: Onset:  since 2019 after her husband had a stroke.  No injury.   Location:  starts from back of neck radiating to top of head Quality:  pressure, throbbing Intensity:  usually 4-5/10 but 9/10 when most severe Aura:  absent Prodrome:  absen Associated symptoms:  Photophobia.  Scalp tender to palpation.  Paresthesia over crown/back of head.  Right lateral neck/trapezius pain.  When severe, feels lightheaded like she may pass out but doesn't.  She denies associated nausea, vomiting, phonophobia, visual disturbance, unilateral numbness or weakness. Duration:  1 hour with ibuprofen.  Usually wakes up with it in the morning Frequency:  4-5 days a week, 1 day a week is severe Frequency of abortive medication: ibuprofen 1-2 days a week Triggers:  unknown Relieving factors:  sometimes moving neck helps, acetaminophen, ibuprofen Activity:   able to function   Cervical spine X-ray on 01/27/2019 personally reviewed was normal.        Past NSAIDS/analgesics:  naproxen, acetaminophen, tramadol Past abortive triptans:  none Past abortive ergotamine:  none Past muscle relaxants:  cyclobenzaprine Past anti-emetic:  none Past antihypertensive medications:  none Past antidepressant medications:  duloxetine Past anticonvulsant medications:  none Past anti-CGRP:  none Past vitamins/Herbal/Supplements:  none Past antihistamines/decongestants:  none Other past therapies:  none     Family history of headache:  no  PAST MEDICAL HISTORY: Past Medical History:  Diagnosis Date   Medical history non-contributory     MEDICATIONS: Current Outpatient Medications on File Prior to Visit  Medication Sig Dispense Refill   gabapentin (NEURONTIN) 100 MG capsule Take 1 capsule at bedtime for one week, then 2 capsules at bedtime for one week, then 3 capsules at bedtime 90 capsule 0   ibuprofen (ADVIL) 400 MG tablet Take 1 tablet (400 mg total) by mouth every 8 (eight) hours as needed. 90 tablet 0   No current facility-administered medications on file prior to visit.    ALLERGIES: Allergies  Allergen Reactions   Fish Allergy Itching    Blisters on skin    FAMILY HISTORY: Family History  Problem Relation Age of Onset   Diabetes Mother    Hypertension Mother    Diabetes Maternal Grandmother       Objective:  *** General: No acute distress.  Patient appears well-groomed.   Head:  Normocephalic/atraumatic Eyes:  Fundi examined but not visualized Neck: supple, no paraspinal tenderness, full range of motion Heart:  Regular rate and rhythm Lungs:  Clear to auscultation bilaterally Back: No paraspinal tenderness Neurological Exam: alert and oriented to person, place,  and time.  Speech fluent and not dysarthric, language intact.  CN II-XII intact. Bulk and tone normal, muscle strength 5/5 throughout.  Sensation to light touch  intact.  Deep tendon reflexes 2+ throughout, toes downgoing.  Finger to nose testing intact.  Gait normal, Romberg negative.   Leslie Millet, DO  CC: Leslie Passe, NP

## 2022-08-27 ENCOUNTER — Other Ambulatory Visit: Payer: Self-pay

## 2022-08-27 ENCOUNTER — Ambulatory Visit (INDEPENDENT_AMBULATORY_CARE_PROVIDER_SITE_OTHER): Payer: Self-pay | Admitting: Neurology

## 2022-08-27 ENCOUNTER — Encounter: Payer: Self-pay | Admitting: Neurology

## 2022-08-27 VITALS — BP 110/73 | HR 75 | Ht 60.0 in | Wt 152.2 lb

## 2022-08-27 DIAGNOSIS — G4486 Cervicogenic headache: Secondary | ICD-10-CM

## 2022-08-27 DIAGNOSIS — M542 Cervicalgia: Secondary | ICD-10-CM

## 2022-08-27 DIAGNOSIS — M5481 Occipital neuralgia: Secondary | ICD-10-CM

## 2022-08-27 MED ORDER — GABAPENTIN 100 MG PO CAPS
ORAL_CAPSULE | ORAL | 0 refills | Status: DC
  Filled 2022-08-27: qty 90, 30d supply, fill #0
  Filled 2022-09-05: qty 90, 37d supply, fill #0

## 2022-08-27 NOTE — Patient Instructions (Signed)
Restart gabapentin - 1 pill at bedtime for one week, then 2 pills at bedtime for one week, then 3 pills at bedtime.  Contact me for refill Look into physical therapy and what the out of pocket cost would be - if reasonable, contact me for a referral Follow up 4 months.     1. Reinicie la gabapentina: 1 pastilla antes de [employer], luego 2 pastillas antes de [employer], luego 3 pastillas antes de [place]. Contctame para recargar 2. Investigue la fisioterapia y cul sera el costo de desembolso personal; si es [place], comunquese conmigo para obtener una derivacin. 3. Seguimiento a los 4 meses.

## 2022-09-03 ENCOUNTER — Other Ambulatory Visit: Payer: Self-pay

## 2022-09-05 ENCOUNTER — Other Ambulatory Visit: Payer: Self-pay

## 2022-12-04 ENCOUNTER — Ambulatory Visit (HOSPITAL_COMMUNITY)
Admission: EM | Admit: 2022-12-04 | Discharge: 2022-12-04 | Disposition: A | Discharge status: Home / Self Care | Payer: Self-pay | Attending: Internal Medicine | Admitting: Internal Medicine

## 2022-12-04 ENCOUNTER — Encounter (HOSPITAL_COMMUNITY): Payer: Self-pay

## 2022-12-04 DIAGNOSIS — K5900 Constipation, unspecified: Secondary | ICD-10-CM

## 2022-12-04 DIAGNOSIS — K219 Gastro-esophageal reflux disease without esophagitis: Secondary | ICD-10-CM

## 2022-12-04 DIAGNOSIS — R079 Chest pain, unspecified: Secondary | ICD-10-CM

## 2022-12-04 DIAGNOSIS — R519 Headache, unspecified: Secondary | ICD-10-CM

## 2022-12-04 DIAGNOSIS — G8929 Other chronic pain: Secondary | ICD-10-CM

## 2022-12-04 MED ORDER — ALUM & MAG HYDROXIDE-SIMETH 200-200-20 MG/5ML PO SUSP
ORAL | Status: AC
  Filled 2022-12-04: qty 30

## 2022-12-04 MED ORDER — POLYETHYLENE GLYCOL 3350 17 GM/SCOOP PO POWD: 1.0000 | Freq: Once | ORAL | 0 refills | Status: AC

## 2022-12-04 MED ORDER — ALUM & MAG HYDROXIDE-SIMETH 200-200-20 MG/5ML PO SUSP: 30.0000 mL | Freq: Once | ORAL | Status: DC

## 2022-12-04 MED ORDER — ALUM & MAG HYDROXIDE-SIMETH 200-200-20 MG/5ML PO SUSP
30.0000 mL | Freq: Once | ORAL | Status: AC
  Administered 2022-12-04: 30 mL via ORAL

## 2022-12-04 MED ORDER — FAMOTIDINE 20 MG PO TABS: 20.0000 mg | ORAL_TABLET | Freq: Two times a day (BID) | ORAL | 0 refills | Status: AC

## 2022-12-04 MED ORDER — LIDOCAINE VISCOUS HCL 2 % MT SOLN
15.0000 mL | Freq: Once | OROMUCOSAL | Status: AC
  Administered 2022-12-04: 15 mL via OROMUCOSAL

## 2022-12-04 MED ORDER — LIDOCAINE VISCOUS HCL 2 % MT SOLN
OROMUCOSAL | Status: AC
  Filled 2022-12-04: qty 15

## 2022-12-04 NOTE — ED Triage Notes (Signed)
Pt is here for chest pain and head pain x 2-3 days.

## 2022-12-04 NOTE — Discharge Instructions (Addendum)
I have prescribed famotidine for you to take in the morning 30 minutes before breakfast and 30 minutes before dinner. This medication will help reduce the amount of acid your stomach makes and therefore improve your stomach pain and chest pain.  Your EKG looks great today.  I do not think that your chest pain is related to your heart. Stop taking ibuprofen as I believe this is contributing to your chest and stomach pain. You may take 1000 mg of Tylenol every 6 hours as needed for pain.  Schedule an appointment for follow-up with your primary care provider and your neurologist.  Avoid spicy or acidic foods like tomatoes, chocolate, coffee, or acidic fruits like oranges as these can trigger symptoms. I have included acid reflux diet education in your packet for your review. Please also allow 2 hours after meals before lying flat to help prevent symptoms.   Take MiraLAX as prescribed once daily to help thin your bowel movements as you are constipated at this time. You may use this once daily until you are able to have a normal bowel movement, then use this as needed.  Increase your water intake and fiber intake to help with constipation in the future.  If your symptoms do not improve in the next 2-3 days with interventions, please return. Please follow up with your primary care provider for further management, evaluation, and ongoing wellness visits. Return or go to the emergency room for severe symptoms of shortness of breath, worsening or uncontrolled abdominal or chest pain, headache, light headedness, feeling faint, nausea, vomiting, bloody vomit or stools, black tarry stools, or any other concerning symptoms. I hope you feel better!

## 2022-12-04 NOTE — ED Provider Notes (Signed)
[facility]    CSN: [id number] Arrival date & time: 12/04/22  1093      History   Chief Complaint Chief Complaint  Patient presents with   Headache   Chest Pain    HPI Leslie Hess is a 41 y.o. female.   Patient presents to urgent care for evaluation of stabbing sensation in the chest that has been present since August 2023 (3 months ago) intermittently but has worsened over the last 2 to 3 days and become more constant.  She cannot identify any triggering or relieving factors for chest discomfort and states that it is mostly to the left side of the chest.  Chest pain is reproducible when she pushes on her chest.  No recent heavy lifting, trauma/injury to the chest wall, URI symptoms, fever/chills, left arm pain, jaw pain, neck pain, dizziness, vomiting, or recent falls. She first noticed chest discomfort 3 months ago after taking gabapentin for headaches prescribed by neurologist. She stopped taking the gabapentin due to onset of chest discomfort shortly after starting it 3 months ago, but the pain has persisted. She has chronic migraine headaches and has been taking ibuprofen/tylenol daily in attempt to relieve them. Last appointment with neurologist was in August, scheduled to have follow-up in January 2024 in 1 month. Headache is currently 4 on a scale of 0-10 and tolerable. No dizziness or vision changes. Complaining of left upper quadrant/epigastric abdominal intermittent pain that started flaring up at around the same time of the intermittent chest pains 3 months ago as well. She has had LUQ abdominal pain for "years" and has had negative workup with CT again. Pain is not better or worse after eating, no acid reflux symptoms, and denies frequent intake of spicy/fatty foods. She does like spicy foods but states she uses "mild spices". Drinks 1 cup of coffee daily, no excessive caffeine intake. Denies smoking and drug use. No change in pain with eating. Chest  discomfort and left upper abdominal discomfort are both currently 6 on a scale of 0-10. Denies history of same symptoms as well as history of gastrointestinal, cardiac, or respiratory problems. No attempted use of any OTC medications prior to arrival for symptoms.    Headache Chest Pain Associated symptoms: headache     Past Medical History:  Diagnosis Date   Medical history non-contributory     There are no problems to display for this patient.   Past Surgical History:  Procedure Laterality Date   NO PAST SURGERIES      OB History     Gravida  3   Para  3   Term  3   Preterm  0   AB  0   Living  3      SAB  0   IAB  0   Ectopic  0   Multiple  0   Live Births  3            Home Medications    Prior to Admission medications   Medication Sig Start Date End Date Taking? Authorizing Provider  famotidine (PEPCID) 20 MG tablet Take 1 tablet (20 mg total) by mouth 2 (two) times daily. 12/04/22  Yes Carlisle Beers, FNP  gabapentin (NEURONTIN) 100 MG capsule Take 1 capsule (100 mg total) by mouth at bedtime for 7 days, THEN 2 capsules (200 mg total) at bedtime for 7 days, THEN 3 capsules (300 mg total) at bedtime for 23 days. 08/27/22 10/12/22  Drema Dallas, DO  ibuprofen (ADVIL) 400 MG tablet Take 1 tablet (400 mg total) by mouth every 8 (eight) hours as needed. 11/13/21   Grayce Sessions, NP  polyethylene glycol powder (GLYCOLAX/MIRALAX) 17 GM/SCOOP powder Take 255 g by mouth once for 1 dose. 12/04/22 12/04/22 Yes Darlisa Spruiell, Donavan Burnet, FNP    Family History Family History  Problem Relation Age of Onset   Diabetes Mother    Hypertension Mother    Diabetes Maternal Grandmother     Social History Social History   Tobacco Use   Smoking status: Never   Smokeless tobacco: Never  Vaping Use   Vaping Use: Never used  Substance Use Topics   Alcohol use: No   Drug use: No     Allergies   Fish allergy   Review of Systems Review of  Systems  Cardiovascular:  Positive for chest pain.  Neurological:  Positive for headaches.  Per HPI   Physical Exam Triage Vital Signs ED Triage Vitals  Enc Vitals Group     BP 12/04/22 1206 124/84     Pulse Rate 12/04/22 1207 80     Resp 12/04/22 1206 16     Temp 12/04/22 1207 98.1 F (36.7 C)     Temp Source 12/04/22 1207 Oral     SpO2 12/04/22 1206 99 %     Weight --      Height --      Head Circumference --      Peak Flow --      Pain Score 12/04/22 1210 6     Pain Loc --      Pain Edu? --      Excl. in GC? --    No data found.  Updated Vital Signs BP 124/84 (BP Location: Left Arm)   Pulse 80   Temp 98.1 F (36.7 C) (Oral)   Resp 18   LMP 12/04/2022   SpO2 100%   Visual Acuity Right Eye Distance:   Left Eye Distance:   Bilateral Distance:    Right Eye Near:   Left Eye Near:    Bilateral Near:     Physical Exam Vitals and nursing note reviewed.  Constitutional:      Appearance: She is not ill-appearing or toxic-appearing.  HENT:     Head: Normocephalic and atraumatic.     Right Ear: Hearing and external ear normal.     Left Ear: Hearing and external ear normal.     Nose: Nose normal.     Mouth/Throat:     Lips: Pink.  Eyes:     General: Lids are normal. Vision grossly intact. Gaze aligned appropriately.     Extraocular Movements: Extraocular movements intact.     Conjunctiva/sclera: Conjunctivae normal.  Cardiovascular:     Rate and Rhythm: Normal rate and regular rhythm.     Heart sounds: Normal heart sounds, S1 normal and S2 normal.  Pulmonary:     Effort: Pulmonary effort is normal. No respiratory distress.     Breath sounds: Normal breath sounds and air entry.  Chest:       Comments: Chest pain is reproducible with palpation at areas indicated above. Abdominal:     General: Abdomen is flat. Bowel sounds are normal.     Palpations: Abdomen is soft.     Tenderness: There is abdominal tenderness in the epigastric area. There is no right  CVA tenderness, left CVA tenderness or guarding.  Musculoskeletal:     Cervical back: Neck supple.  Skin:  General: Skin is warm and dry.     Capillary Refill: Capillary refill takes less than 2 seconds.     Findings: No rash.  Neurological:     General: No focal deficit present.     Mental Status: She is alert and oriented to person, place, and time. Mental status is at baseline.     Cranial Nerves: No dysarthria or facial asymmetry.  Psychiatric:        Mood and Affect: Mood normal.        Speech: Speech normal.        Behavior: Behavior normal.        Thought Content: Thought content normal.        Judgment: Judgment normal.      UC Treatments / Results  Labs (all labs ordered are listed, but only abnormal results are displayed) Labs Reviewed - No data to display  EKG   Radiology No results found.  Procedures Procedures (including critical care time)  Medications Ordered in UC Medications  alum & mag hydroxide-simeth (MAALOX/MYLANTA) 200-200-20 MG/5ML suspension 30 mL (30 mLs Oral Given 12/04/22 1307)  lidocaine (XYLOCAINE) 2 % viscous mouth solution 15 mL (15 mLs Mouth/Throat Given 12/04/22 1308)    Initial Impression / Assessment and Plan / UC Course  I have reviewed the triage vital signs and the nursing notes.  Pertinent labs & imaging results that were available during my care of the patient were reviewed by me and considered in my medical decision making (see chart for details).   1. Chest pain, GERD Presentation is consistent with chest discomfort related to acid reflux and GERD. EKG is without red flag ST/T wave changes, no indication for referral to ED for further workup. Pain is reproducible with palpation of the chest wall. Low suspicion for cardiac nature of chest pain. We will trial use of famotidine 20mg  BID. Patient given GI cocktail in clinic to improve epigastric discomfort. May also use tylenol as needed for discomfort. GERD dietary precautions  discussed regarding spicy foods, NSAIDs, caffeine, and fatty foods. PCP follow-up recommended.   2. Chronic headache May continue tylenol as needed for head pain. Neurovascular exam is stable and without focal deficit. Follow-up with neurologist recommended.  3. Constipation May use Miralax once daily until able to have normal bowel movement, then as needed. Increase water intake and fiber intake to prevent constipation in the future. PCP follow-up recommended.  Discussed physical exam and available lab work findings in clinic with patient.  Counseled patient regarding appropriate use of medications and potential side effects for all medications recommended or prescribed today. Discussed red flag signs and symptoms of worsening condition,when to call the PCP office, return to urgent care, and when to seek higher level of care in the emergency department. Patient verbalizes understanding and agreement with plan. All questions answered. Patient discharged in stable condition.    Final Clinical Impressions(s) / UC Diagnoses   Final diagnoses:  Chest pain, unspecified type  Gastroesophageal reflux disease without esophagitis  Chronic nonintractable headache, unspecified headache type  Constipation, unspecified constipation type     Discharge Instructions      I have prescribed famotidine for you to take in the morning 30 minutes before breakfast and 30 minutes before dinner. This medication will help reduce the amount of acid your stomach makes and therefore improve your stomach pain and chest pain.  Your EKG looks great today.  I do not think that your chest pain is related to your heart. Stop  taking ibuprofen as I believe this is contributing to your chest and stomach pain. You may take 1000 mg of Tylenol every 6 hours as needed for pain.  Schedule an appointment for follow-up with your primary care provider and your neurologist.  Avoid spicy or acidic foods like tomatoes, chocolate,  coffee, or acidic fruits like oranges as these can trigger symptoms. I have included acid reflux diet education in your packet for your review. Please also allow 2 hours after meals before lying flat to help prevent symptoms.   Take MiraLAX as prescribed once daily to help thin your bowel movements as you are constipated at this time. You may use this once daily until you are able to have a normal bowel movement, then use this as needed.  Increase your water intake and fiber intake to help with constipation in the future.  If your symptoms do not improve in the next 2-3 days with interventions, please return. Please follow up with your primary care provider for further management, evaluation, and ongoing wellness visits. Return or go to the emergency room for severe symptoms of shortness of breath, worsening or uncontrolled abdominal or chest pain, headache, light headedness, feeling faint, nausea, vomiting, bloody vomit or stools, black tarry stools, or any other concerning symptoms. I hope you feel better!       ED Prescriptions     Medication Sig Dispense Auth. Provider   polyethylene glycol powder (GLYCOLAX/MIRALAX) 17 GM/SCOOP powder Take 255 g by mouth once for 1 dose. 255 g Joella Prince M, FNP   famotidine (PEPCID) 20 MG tablet Take 1 tablet (20 mg total) by mouth 2 (two) times daily. 30 tablet Talbot Grumbling, FNP      PDMP not reviewed this encounter.   Talbot Grumbling, [place] 12/06/22 2037

## 2023-01-07 ENCOUNTER — Ambulatory Visit: Payer: Self-pay | Attending: Nurse Practitioner

## 2023-01-19 NOTE — Progress Notes (Signed)
NEUROLOGY FOLLOW UP OFFICE NOTE  Leslie Hess [id number]  Assessment/Plan:   1  Cervicogenic headache 2  Bilateral occipital neuralgia 3  Right sided cervicalgia   Will try nortriptyline 10mg  at bedtime.  We can increase dose to 25mg  at bedtime in 4 weeks if needed. Limit use of pain relievers to no more than 2 days out of week to prevent risk of rebound or medication-overuse headache. Advised to follow up with PCP regarding lightheadedness and chest pain. Follow up 5 months.     Subjective:  Leslie Hess is a 42 year old right-handed female who follows up for headache.   UPDATE: Restarted gabapentin last visit.  She stopped taking it because she started experiencing mid and left sided chest pain.  Non radiating down the arm.  She was seen in ED last month for this and was diagnosed with GERD.  EKG was normal.    Still has some mild occipital headaches and neck pain.    Sometimes she says she feels "faint".     Current NSAIDS/analgesics:  ibuprofen, sometimes ASA Current triptans:  none Current ergotamine:  none Current anti-emetic:  none Current muscle relaxants:  none Current Antihypertensive medications:  none Current Antidepressant medications:  none Current Anticonvulsant medications:  none Current anti-CGRP:  none Current Vitamins/Herbal/Supplements:  none Current Antihistamines/Decongestants:  none Other therapy:  none Hormone/birth control:  none   HISTORY: Onset:  since 2019 after her husband had a stroke.  No injury.   Location:  starts from back of neck radiating to top of head Quality:  pressure, throbbing Intensity:  usually 4-5/10 but 9/10 when most severe Aura:  absent Prodrome:  absen Associated symptoms:  Photophobia.  Scalp tender to palpation.  Paresthesia over crown/back of head.  Right lateral neck/trapezius pain.  When severe, feels lightheaded like she may pass out but doesn't.  She denies associated nausea, vomiting,  phonophobia, visual disturbance, unilateral numbness or weakness. Duration:  1 hour with ibuprofen.  Usually wakes up with it in the morning Frequency:  4-5 days a week, 1 day a week is severe Frequency of abortive medication: ibuprofen 1-2 days a week Triggers:  unknown Relieving factors:  sometimes moving neck helps, acetaminophen, ibuprofen Activity:  able to function   Cervical spine X-ray on 01/27/2019 was normal.       Past NSAIDS/analgesics:  naproxen, acetaminophen, tramadol Past abortive triptans:  none Past abortive ergotamine:  none Past muscle relaxants:  cyclobenzaprine Past anti-emetic:  none Past antihypertensive medications:  none Past antidepressant medications:  duloxetine Past anticonvulsant medications:  gabapentin Past anti-CGRP:  none Past vitamins/Herbal/Supplements:  none Past antihistamines/decongestants:  none Other past therapies:  none     Family history of headache:  no  PAST MEDICAL HISTORY: Past Medical History:  Diagnosis Date   Medical history non-contributory     MEDICATIONS: Current Outpatient Medications on File Prior to Visit  Medication Sig Dispense Refill   famotidine (PEPCID) 20 MG tablet Take 1 tablet (20 mg total) by mouth 2 (two) times daily. 30 tablet 0   gabapentin (NEURONTIN) 100 MG capsule Take 1 capsule (100 mg total) by mouth at bedtime for 7 days, THEN 2 capsules (200 mg total) at bedtime for 7 days, THEN 3 capsules (300 mg total) at bedtime for 23 days. 90 capsule 0   ibuprofen (ADVIL) 400 MG tablet Take 1 tablet (400 mg total) by mouth every 8 (eight) hours as needed. 90 tablet 0   No current facility-administered medications on file prior  to visit.    ALLERGIES: Allergies  Allergen Reactions   Fish Allergy Itching    Blisters on skin    FAMILY HISTORY: Family History  Problem Relation Age of Onset   Diabetes Mother    Hypertension Mother    Diabetes Maternal Grandmother       Objective:  Blood pressure  124/79, pulse 89, height 5\' 3"  (1.6 m), weight 151 lb 3.2 oz (68.6 kg), SpO2 99 %. General: No acute distress.  Patient appears well-groomed.   Head:  Normocephalic/atraumatic Eyes:  Fundi examined but not visualized Neck: supple, bilatera paraspinal tenderness, full range of motion Heart:  Regular rate and rhythm Neurological Exam: alert and oriented to person, place, and time.  Speech fluent and not dysarthric, language intact.  CN II-XII intact. Bulk and tone normal, muscle strength 5/5 throughout.  Sensation to light touch intact.  Deep tendon reflexes 2+ throughout.  Finger to nose testing intact.  Gait normal, Romberg negative.   Metta Clines, DO  CC: Juluis Mire, NP

## 2023-01-20 ENCOUNTER — Other Ambulatory Visit: Payer: Self-pay

## 2023-01-20 ENCOUNTER — Ambulatory Visit (INDEPENDENT_AMBULATORY_CARE_PROVIDER_SITE_OTHER): Payer: Self-pay | Admitting: Neurology

## 2023-01-20 ENCOUNTER — Encounter: Payer: Self-pay | Admitting: Neurology

## 2023-01-20 VITALS — BP 124/79 | HR 89 | Ht 63.0 in | Wt 151.2 lb

## 2023-01-20 DIAGNOSIS — M542 Cervicalgia: Secondary | ICD-10-CM

## 2023-01-20 DIAGNOSIS — M5481 Occipital neuralgia: Secondary | ICD-10-CM

## 2023-01-20 DIAGNOSIS — G4486 Cervicogenic headache: Secondary | ICD-10-CM

## 2023-01-20 MED ORDER — NORTRIPTYLINE HCL 10 MG PO CAPS
10.0000 mg | ORAL_CAPSULE | Freq: Every day | ORAL | 5 refills | Status: DC
  Filled 2023-01-20: qty 30, 30d supply, fill #0
  Filled 2023-02-25: qty 30, 30d supply, fill #1
  Filled 2023-05-07: qty 90, 90d supply, fill #2

## 2023-01-20 NOTE — Patient Instructions (Addendum)
Start nortriptyline 10mg  at bedtime.  If no improvement in headaches in 4 weeks, contact me and I will increase dose Limit use of pain relievers such as Tylenol to no more than 2 days out of week to prevent risk of rebound or medication-overuse headache. Follow up with your primary care provider regarding lightheadedness and chest pain. Follow up 5 months.     1. Inicie nortriptilina 10 mg antes de acostarse. Si no mejoran los dolores de [place] en 4 semanas, comunquese conmigo y Garment/textile technologist la dosis. 2. Limite el uso de analgsicos como Tylenol a no ms de 2 das por [employer] para English as a second language teacher riesgo de rebote o dolor de cabeza por uso excesivo de medicamentos. 3. Haga un seguimiento con su proveedor de atencin primaria sobre aturdimiento y Tourist information centre manager. 4. Seguimiento 5 meses.

## 2023-01-21 ENCOUNTER — Other Ambulatory Visit: Payer: Self-pay

## 2023-01-29 ENCOUNTER — Ambulatory Visit (INDEPENDENT_AMBULATORY_CARE_PROVIDER_SITE_OTHER): Payer: Self-pay | Admitting: Primary Care

## 2023-01-29 ENCOUNTER — Encounter (INDEPENDENT_AMBULATORY_CARE_PROVIDER_SITE_OTHER): Payer: Self-pay | Admitting: Primary Care

## 2023-01-29 VITALS — BP 127/85 | HR 90 | Resp 16 | Ht 60.0 in | Wt 152.8 lb

## 2023-01-29 DIAGNOSIS — R7303 Prediabetes: Secondary | ICD-10-CM

## 2023-01-29 DIAGNOSIS — Z13220 Encounter for screening for lipoid disorders: Secondary | ICD-10-CM

## 2023-01-29 DIAGNOSIS — E663 Overweight: Secondary | ICD-10-CM

## 2023-01-29 DIAGNOSIS — R600 Localized edema: Secondary | ICD-10-CM

## 2023-01-29 DIAGNOSIS — L84 Corns and callosities: Secondary | ICD-10-CM

## 2023-01-29 DIAGNOSIS — Z1159 Encounter for screening for other viral diseases: Secondary | ICD-10-CM

## 2023-01-29 NOTE — Patient Instructions (Signed)
Los callos y las callosidades son capas de piel gruesas y endurecidas que se desarrollan cuando la piel intenta protegerse contra la friccin o la presin. A menudo se forman en los pies y los dedos de los pies o en las manos y [place]. Si est sano, no necesita tratamiento para los callos y durezas a menos que le causen dolor o no le guste su apariencia.Recuento de caloras para bajar de peso Calorie Counting for [employer] Loss Las caloras son unidades de Teacher, early years/pre. El cuerpo necesita una cierta cantidad de caloras de los alimentos para que lo ayuden a funcionar durante todo Games developer. Cuando se comen o beben ms caloras de las que el cuerpo [place], este acumula las caloras adicionales mayormente como grasa. Cuando se comen o beben menos caloras de las que el cuerpo [place], este quema grasa para obtener la energa que necesita. El recuento de caloras es el registro de la cantidad de caloras que se comen y English as a second language teacher. El recuento de caloras puede ser de ayuda si necesita perder peso. Si come menos caloras de las que el cuerpo necesita, debera bajar de [place]. Pregntele al mdico cul es un peso sano para usted. Para que el recuento de caloras funcione, usted tendr que ingerir la cantidad de caloras adecuadas cada da, para bajar una cantidad de peso saludable por semana. Un nutricionista puede ayudar a determinar la cantidad de caloras que usted necesita por da y sugerirle formas de Science writer su objetivo calrico. Ardelia Mems cantidad de peso saludable para bajar cada semana suele ser entre 1 y 2 libras (0.5 a 0.9 kg). Esto habitualmente significa que su ingesta diaria de caloras se debera reducir en unas 500 a 750 caloras. Ingerir de 1200 a 1500 caloras por Administrator, Civil Service a la [employer] de las mujeres a Sports coach de [place]. Ingerir de 1500 a 1800 caloras por Administrator, Civil Service a la [employer] de los hombres a Sports coach de [place]. Qu debo saber acerca del recuento de caloras? Trabaje con el mdico o el  nutricionista para determinar cuntas caloras debe recibir Armed forces operational officer. A fin de alcanzar su objetivo diario de caloras, tendr que: Averiguar cuntas caloras hay en cada alimento que le Therapist, occupational. Intente hacerlo antes de comer. Decidir la cantidad que puede comer del alimento. Llevar un registro de los alimentos. Para esto, anote lo que comi y cuntas caloras tena. Para perder peso con xito, es importante equilibrar el recuento de caloras con un estilo de vida saludable que incluya actividad fsica de forma regular. Dnde encuentro informacin sobre las caloras?  Es posible Animator cantidad de caloras que contiene un alimento en la etiqueta de informacin nutricional. Si un alimento no tiene una etiqueta de informacin nutricional, intente buscar las caloras en Internet o pida ayuda al nutricionista. Recuerde que las caloras se calculan por porcin. Si opta por comer ms de una porcin de un alimento, tendr [employer] las caloras de una porcin por la cantidad de porciones que planea comer. Por ejemplo, la etiqueta de un envase de pan puede decir que el tamao de una porcin es 1 rodaja, y que una porcin tiene 90 caloras. Si come 1 rodaja, habr comido 90 caloras. Si come 2 rodajas, habr comido 180 caloras. Cmo llevo un registro de comidas? Despus de cada vez que coma, anote lo siguiente en el registro de alimentos lo antes posible: Lo que comi. Asegrese de [employer], las salsas y otros extras [employer]. La cantidad que comi. Esto  se puede medir en tazas, onzas o cantidad de alimentos. Cuntas caloras haba en cada alimento y en cada bebida. La cantidad total de caloras en la comida que tom. Tenga a Materials engineer de alimentos, por ejemplo, en un anotador de bolsillo o utilice una aplicacin o sitio web en el telfono mvil. Algunos programas calcularn las caloras por usted y Automotive engineer la cantidad de caloras que le quedan para llegar  al objetivo diario. Cules son algunos consejos para controlar las porciones? Sepa cuntas caloras hay en una porcin. Esto lo ayudar a saber cuntas porciones de un alimento determinado puede comer. Use una taza medidora para medir los tamaos de las porciones. Tambin Secondary school teacher las porciones en una balanza de cocina. Con el tiempo, podr hacer un clculo estimativo de los tamaos de las porciones de algunos alimentos. Dedique tiempo a poner porciones de diferentes alimentos en sus platos, tazones y tazas predilectos, a fin de saber cmo se ve una porcin. Intente no comer directamente de un envase de alimentos, por ejemplo, de una bolsa o una caja. Comer directamente del envase dificulta ver cunto est comiendo y puede conducir a Photographer. Ponga la cantidad [employer] gustara comer en una taza o un plato, a fin de asegurarse de que est comiendo la porcin correcta. Use platos, vasos y tazones ms pequeos para medir porciones ms pequeas y Product/process development scientist no comer en exceso. Intente no realizar varias tareas al [employer]. Por ejemplo, evite mirar televisin o usar la [employer] come. Si es la hora de comer, sintese a Conservation officer, nature y disfrute de Environmental education officer. Esto lo ayudar a Marine scientist cundo est satisfecho. Tambin le permitir estar ms consciente de qu come y cunto come. Consejos para seguir Catering manager Al leer las etiquetas de los alimentos Controle el recuento de caloras en comparacin con el tamao de la porcin. El tamao de la porcin puede ser ms pequeo de lo que suele comer. Verifique la fuente de las caloras. Intente elegir alimentos ricos en protenas, fibras y vitaminas, y bajos en grasas saturadas, grasas trans y [place]. Al ir de compras Lea las etiquetas nutricionales cuando compre. Esto lo ayudar a tomar decisiones saludables sobre qu alimentos comprar. Preste atencin a las etiquetas nutricionales de alimentos bajos en grasas o sin grasas. Estos alimentos a  veces tienen la misma cantidad de caloras o ms caloras que las versiones ricas en grasas. Con frecuencia, tambin tienen agregados de azcar, almidn o sal, para darles el sabor que fue eliminado con las grasas. Haga una lista de compras con los alimentos que tienen un menor contenido de caloras y Freight forwarder. Al cocinar Intente cocinar sus alimentos preferidos de una manera ms saludable. Por ejemplo, pruebe hornear en vez de frer. Utilice productos lcteos descremados. Planificacin de las comidas Utilice ms frutas y verduras. La mitad de su plato debe ser de frutas y verduras. Incluya protenas magras, como pollo, pavo y [place]. Estilo de [employer], trate de hacer una de las siguientes cosas: 150 minutos de ejercicio moderado, como caminar. 75 minutos de ejercicio enrgico, como correr. Informacin general Sepa cuntas caloras tienen los alimentos que come con ms frecuencia. Esto le ayudar a contar las caloras ms rpidamente. Encuentre un mtodo para controlar las caloras que funcione para usted. Sea creativo. Pruebe aplicaciones o programas distintos, si llevar un registro de las caloras no funciona para usted. Qu alimentos debo consumir?  Consuma alimentos nutritivos. Es mejor comer un alimento nutritivo, de alto contenido  calrico, como un aguacate, que uno con pocos nutrientes, como una bolsa de patatas fritas. Use sus caloras en alimentos y bebidas que lo sacien y no lo dejen con apetito apenas termina de comer. Ejemplos de alimentos que lo sacian son los frutos secos y Engineer, mining de frutos secos, verduras, Advertising account planner y Clinical research associate con alto contenido de Pharmacist, hospital como los cereales integrales. Los alimentos con alto contenido de [place] son aquellos que tienen ms de 5 g de fibra por porcin. Preste atencin a las [employer] Data. Las bebidas de bajas caloras incluyen agua y refrescos sin Location manager. Es posible que los productos que se enumeran ms [place] no  constituyan una lista completa de los alimentos y las bebidas que puede tomar. Consulte a un nutricionista para obtener ms informacin. Qu alimentos debo limitar? Limite el consumo de alimentos o bebidas que no sean buenas fuentes de vitaminas, minerales o protenas, o que tengan alto contenido de grasas no saludables. Estos incluyen: Caramelos. Otros dulces. Refrescos, bebidas con caf especiales, alcohol y [place]. Es posible que los productos que se enumeran ms [place] no constituyan una lista completa de los alimentos y las bebidas que Nurse, adult. Consulte a un nutricionista para obtener ms informacin. Cmo puedo hacer el recuento de caloras cuando como afuera? Preste atencin a las porciones. A menudo, las porciones son mucho ms grandes al comer afuera. Pruebe con estos consejos para mantener las porciones ms pequeas: Considere la posibilidad de compartir una comida en lugar de tomarla toda usted solo. Si pide su propia comida, coma solo la mitad. Antes de empezar a comer, pida un recipiente y ponga la mitad de la comida en l. Cuando sea posible, considere la posibilidad de pedir porciones ms pequeas del men en lugar de porciones completas. Preste atencin a Marine scientist de alimentos y bebidas. Saber la forma en que se cocinan los alimentos y lo que incluye la comida puede ayudarlo a ingerir menos caloras. Si se detallan las caloras en el men, elija las opciones que contengan la menor cantidad. Elija platos que incluyan verduras, frutas, cereales integrales, productos lcteos con bajo contenido de grasa y Advertising account planner. Opte por los alimentos hervidos, asados, cocidos a la parrilla o al vapor. Evite los alimentos a los que se les ponga mantequilla, que estn empanados o fritos, o que se sirvan con salsa a base de crema. Generalmente, los alimentos que se etiquetan como "crujientes" estn fritos, a menos que se indique lo contrario. Elija el agua, la [place], [place] t  helado sin azcar u otras bebidas que no contengan azcares agregados. Si desea una bebida alcohlica, escoja una opcin con menos caloras, como una copa de vino o una cerveza ligera. Ordene los [employer], las salsas y los jarabes aparte. Estos son, con frecuencia, de alto contenido en caloras, por lo que debe limitar la cantidad que ingiere. Si desea [place], elija una de hortalizas y pida carnes a la parrilla. Evite las guarniciones adicionales como el tocino, el queso o los alimentos fritos. Ordene el aderezo aparte o pida aceite de [place] y vinagre o limn para Haematologist. Haga un clculo estimativo de la cantidad de porciones que le sirven. Conocer el tamao de las porciones lo ayudar a Personnel officer atento a la cantidad de comida que come [employer]. Dnde buscar ms informacin Centers for Disease Control and Prevention (Centros para el Control y [place]): http://www.wolf.info/ U.S. Department of Agriculture (Departamento de Agricultura de los EE. UU.): http://www.wilson-mendoza.org/ Resumen El recuento  de caloras es el registro de la cantidad de caloras que se comen y Audiological scientist. Si come menos caloras de las que el cuerpo necesita, debera bajar de [place]. Una cantidad de peso saludable para bajar por semana suele ser entre 1 y 2 libras (0.5 a 0.9 kg). Esto significa, con frecuencia, reducir su ingesta diaria de caloras unas 500 a 750 caloras. Es posible Veterinary surgeon cantidad de caloras que contiene un alimento en la etiqueta de informacin nutricional. Si un alimento no tiene una etiqueta de informacin nutricional, intente buscar las caloras en Internet o pida ayuda al nutricionista. Use platos, vasos y tazones ms pequeos para medir porciones ms pequeas y Automotive engineer no comer en exceso. Use sus caloras en alimentos y bebidas que lo sacien y no lo dejen con apetito poco tiempo despus de haber comido. Esta informacin no tiene Theme park manager el consejo del mdico. Asegrese de  hacerle al mdico cualquier pregunta que tenga. Document Revised: 04/09/2020 Document Reviewed: 04/09/2020 Elsevier Patient Education  2023 [employer].

## 2023-01-29 NOTE — Progress Notes (Signed)
Leslie Hess, is a 41 y.o. female  HYQ:[id number]  [id number]  DOB - 01-11-1981  No chief complaint on file.      Subjective:   Leslie Hess is a 42 y.o.  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 She states her ankles and feet are swelling and hurting after during activities- walking for a period of time. Patient has No headache, No chest pain, No abdominal pain - No Nausea, No new weakness tingling or numbness, No Cough - shortness of breath  No problems updated.  Allergies  Allergen Reactions   Fish Allergy Itching    Blisters on skin    Past Medical History:  Diagnosis Date   Medical history non-contributory     Current Outpatient Medications on File Prior to Visit  Medication Sig Dispense Refill   famotidine (PEPCID) 20 MG tablet Take 1 tablet (20 mg total) by mouth 2 (two) times daily. 30 tablet 0   gabapentin (NEURONTIN) 100 MG capsule Take 1 capsule (100 mg total) by mouth at bedtime for 7 days, THEN 2 capsules (200 mg total) at bedtime for 7 days, THEN 3 capsules (300 mg total) at bedtime for 23 days. 90 capsule 0   ibuprofen (ADVIL) 400 MG tablet Take 1 tablet (400 mg total) by mouth every 8 (eight) hours as needed. 90 tablet 0   nortriptyline (PAMELOR) 10 MG capsule Take 1 capsule (10 mg total) by mouth at bedtime. 30 capsule 5   No current facility-administered medications on file prior to visit.    Objective:   Vitals:   01/29/23 1454  BP: 127/85  Pulse: 90  Resp: 16  SpO2: 99%  Weight: 152 lb 12.8 oz (69.3 kg)  Height: 5' (1.524 m)    Exam General appearance : Awake, alert, not in any distress. Speech Clear. Not toxic looking HEENT: Atraumatic and Normocephalic, pupils equally reactive to light and accomodation Neck: Supple, no JVD. No cervical lymphadenopathy.  Chest: Good air entry bilaterally, no added sounds  CVS: S1 S2 regular, no murmurs.   Abdomen: Bowel sounds present, Non tender and not distended with no gaurding, rigidity or rebound. Extremities: B/L Lower Ext shows no edema, both legs are warm to touch Neurology: Awake alert, and oriented X 3, CN II-XII intact, Non focal Skin: No Rash  Data Review Lab Results  Component Value Date   HGBA1C 5.6 02/04/2022   HGBA1C 5.2 12/15/2018    Assessment & Plan   Diagnoses and all orders for this visit:   Localized edema /pain Edema- elevate legs TID, increase activity, increase water, decrease sodium intake.  Wear compression socks more routinely if available. Return to the office if no change with symptoms.   Callus of toes Bilateral information in Spanish on AVS   Overweight (BMI 25.0-29.9) Discussed diet and exercise for person with BMI >25. Instructed: You must burn more calories than you eat. Losing 5 percent of your body weight should be considered a success. In the longer term, losing more than 15 percent of your body weight and staying at this weight is an extremely good result. However, keep in mind that even losing 5 percent of your body weight leads to important health benefits, so try not to get discouraged if you're not able to lose more than this. Will recheck weight in 3-6 months.   Prediabetes - educated on lifestyle modifications, including but not limited to diet choices and adding exercise to daily routine.   -  CBC with Differential/Platelet -     Hemoglobin A1c  Lipid screening -     Lipid panel  Encounter for HCV screening test for low risk patient -     HCV Ab w Reflex to Quant PCR     Patient have been counseled extensively about nutrition and exercise. Other issues discussed during this visit include: low cholesterol diet, weight control and daily exercise, foot care, annual eye examinations at Ophthalmology, importance of adherence with medications and regular follow-up. We also discussed long term complications of uncontrolled diabetes  and hypertension.   Return in about 6 weeks (around 03/12/2023) for pap.  The patient was given clear instructions to go to ER or return to medical center if symptoms don't improve, worsen or new problems develop. The patient verbalized understanding. The patient was told to call to get lab results if they haven't heard anything in the next week.   This note has been created with Surveyor, quantity. Any transcriptional errors are unintentional.   Leslie Perna, NP 01/29/2023, 3:24 PM

## 2023-01-30 LAB — CBC WITH DIFFERENTIAL/PLATELET
Basophils Absolute: 0 10*3/uL (ref 0.0–0.2)
Basos: 0 %
EOS (ABSOLUTE): 0.1 10*3/uL (ref 0.0–0.4)
Eos: 1 %
Hematocrit: 38 % (ref 34.0–46.6)
Hemoglobin: 12.4 g/dL (ref 11.1–15.9)
Immature Grans (Abs): 0 10*3/uL (ref 0.0–0.1)
Immature Granulocytes: 0 %
Lymphocytes Absolute: 2 10*3/uL (ref 0.7–3.1)
Lymphs: 29 %
MCH: 27.5 pg (ref 26.6–33.0)
MCHC: 32.6 g/dL (ref 31.5–35.7)
MCV: 84 fL (ref 79–97)
Monocytes Absolute: 0.5 10*3/uL (ref 0.1–0.9)
Monocytes: 7 %
Neutrophils Absolute: 4.4 10*3/uL (ref 1.4–7.0)
Neutrophils: 63 %
Platelets: 306 10*3/uL (ref 150–450)
RBC: 4.51 x10E6/uL (ref 3.77–5.28)
RDW: 13.4 % (ref 11.7–15.4)
WBC: 7 10*3/uL (ref 3.4–10.8)

## 2023-01-30 LAB — LIPID PANEL
Chol/HDL Ratio: 4.3 ratio (ref 0.0–4.4)
Cholesterol, Total: 178 mg/dL (ref 100–199)
HDL: 41 mg/dL (ref >39)
LDL Chol Calc (NIH): 104 mg/dL — ABNORMAL HIGH (ref 0–99)
Triglycerides: 188 mg/dL — ABNORMAL HIGH (ref 0–149)
VLDL Cholesterol Cal: 33 mg/dL (ref 5–40)

## 2023-01-30 LAB — CMP14+EGFR
ALT: 17 IU/L (ref 0–32)
AST: 21 IU/L (ref 0–40)
Albumin/Globulin Ratio: 1.9 (ref 1.2–2.2)
Albumin: 4.8 g/dL (ref 3.9–4.9)
Alkaline Phosphatase: 89 IU/L (ref 44–121)
BUN/Creatinine Ratio: 10 (ref 9–23)
BUN: 8 mg/dL (ref 6–20)
Bilirubin Total: 0.2 mg/dL (ref 0.0–1.2)
CO2: 22 mmol/L (ref 20–29)
Calcium: 9.1 mg/dL (ref 8.7–10.2)
Chloride: 104 mmol/L (ref 96–106)
Creatinine, Ser: 0.77 mg/dL (ref 0.57–1.00)
Globulin, Total: 2.5 g/dL (ref 1.5–4.5)
Glucose: 96 mg/dL (ref 70–99)
Potassium: 3.9 mmol/L (ref 3.5–5.2)
Sodium: 140 mmol/L (ref 134–144)
Total Protein: 7.3 g/dL (ref 6.0–8.5)
eGFR: 101 mL/min/{1.73_m2} (ref >59)

## 2023-01-30 LAB — HCV AB W REFLEX TO QUANT PCR: HCV Ab: NONREACTIVE

## 2023-01-30 LAB — HEMOGLOBIN A1C
Est. average glucose Bld gHb Est-mCnc: 123 mg/dL
Hgb A1c MFr Bld: 5.9 % — ABNORMAL HIGH (ref 4.8–5.6)

## 2023-01-30 LAB — HCV INTERPRETATION

## 2023-02-03 ENCOUNTER — Other Ambulatory Visit (INDEPENDENT_AMBULATORY_CARE_PROVIDER_SITE_OTHER): Payer: Self-pay | Admitting: Primary Care

## 2023-02-03 MED ORDER — PRAVASTATIN SODIUM 40 MG PO TABS
40.0000 mg | ORAL_TABLET | Freq: Every day | ORAL | 1 refills | Status: DC
  Filled 2023-02-03: qty 90, 90d supply, fill #0
  Filled 2023-05-07: qty 90, 90d supply, fill #1

## 2023-02-04 ENCOUNTER — Other Ambulatory Visit: Payer: Self-pay

## 2023-02-25 ENCOUNTER — Telehealth: Payer: Self-pay | Admitting: Primary Care

## 2023-02-25 ENCOUNTER — Other Ambulatory Visit: Payer: Self-pay

## 2023-02-25 NOTE — Telephone Encounter (Signed)
Pt applying for CAFA and submitting req'd docs to [facility] today.

## 2023-03-02 ENCOUNTER — Other Ambulatory Visit: Payer: Self-pay

## 2023-03-11 ENCOUNTER — Telehealth (INDEPENDENT_AMBULATORY_CARE_PROVIDER_SITE_OTHER): Payer: Self-pay | Admitting: Primary Care

## 2023-03-11 NOTE — Telephone Encounter (Signed)
Spoke with patient about her appointment in the morning. Patient did confirm she would be here.

## 2023-03-12 ENCOUNTER — Encounter (INDEPENDENT_AMBULATORY_CARE_PROVIDER_SITE_OTHER): Payer: Self-pay | Admitting: Primary Care

## 2023-03-12 ENCOUNTER — Other Ambulatory Visit (HOSPITAL_COMMUNITY)
Admission: RE | Admit: 2023-03-12 | Discharge: 2023-03-12 | Disposition: A | Discharge status: Home / Self Care | Payer: Self-pay | Source: Ambulatory Visit | Attending: Primary Care | Admitting: Primary Care

## 2023-03-12 ENCOUNTER — Ambulatory Visit (INDEPENDENT_AMBULATORY_CARE_PROVIDER_SITE_OTHER): Payer: Self-pay | Admitting: Primary Care

## 2023-03-12 VITALS — BP 116/74 | HR 74 | Resp 16 | Wt 147.4 lb

## 2023-03-12 DIAGNOSIS — Z124 Encounter for screening for malignant neoplasm of cervix: Secondary | ICD-10-CM

## 2023-03-12 NOTE — Patient Instructions (Signed)
Ahora eres prediabtico subi de 5,6 a 5,9. Controle los carbohidratos, arroz, papas, panes, tortilla, dulces, refrescos. Su colesterol es alto, [place] el riesgo de sufrir un ataque cardaco y/o un derrame cerebral. Para reducir su colesterol, recuerde: ms frutas y verduras, ms pescado y limite las carnes rojas y los productos lcteos. Ms soya, nueces, frijoles, cebada, lentejas, avena y [place] enriquecida con steres de Teaching laboratory technician de [place]. Tambin recomiendo eliminar el azcar y los alimentos procesados. Se enva nuevo script para pravastatina 40 mg Los resultados de su examen de deteccin de hepatitis son negativos. El resto de tus laboratorios son normales. La funcin renal, heptica y los electrolitos son esencialmente normales. El CBC no indica anemia ni trastornos hemorrgicos.

## 2023-03-12 NOTE — Progress Notes (Signed)
  [facility] PAP Patient name: Leslie Hess MRN [id number]  Date of birth: 09/18/1981 Chief Complaint:   Gynecologic Exam  History of Present Illness:   Leslie Hess is a 42 y.o. [id number] female being seen today for a routine well-woman exam. Chaperone present .  CC:gyn  The current method of family planning is vasectomy.  No LMP recorded. Last pap 02/02/19.  Family h/o breast cancer: No Last colonoscopy: N/A. Family h/o colorectal cancer: No  Review of Systems:    Denies any headaches, blurred vision, fatigue, shortness of breath, chest pain, abdominal pain, abnormal vaginal discharge/itching/odor/irritation, problems with periods, bowel movements, urination, or intercourse unless otherwise stated above.  Pertinent History Reviewed:   Reviewed past medical,surgical, social and family history.  Reviewed problem list, medications and allergies.  Physical Assessment:   Vitals:   03/12/23 0915  BP: 116/74  Pulse: 74  Resp: 16  SpO2: 100%  Weight: 147 lb 6.4 oz (66.9 kg)  Body mass index is 28.79 kg/m.        Physical Examination:  General appearance - well appearing, and in no distress Mental status - alert, oriented to person, place, and time Psych:  She has a normal mood and affect Skin - warm and dry, normal color, no suspicious lesions noted Chest - effort normal, all lung fields clear to auscultation bilaterally Heart - normal rate and regular rhythm Neck:  midline trachea, no thyromegaly or nodules Breasts - breasts appear normal, no suspicious masses, no skin or nipple changes or axillary nodes Educated patient on proper self breast examination and had patient to demonstrate SBE. Abdomen - soft, nontender, nondistended, no masses or organomegaly Pelvic-VULVA: normal appearing vulva with no masses, tenderness or lesions   VAGINA: normal appearing vagina with normal color and discharge, no lesions    CERVIX: normal appearing cervix without discharge or lesions, no CMT UTERUS: uterus is felt to be normal size, shape, consistency and nontender  ADNEXA: No adnexal masses or tenderness noted. Extremities:  No swelling or varicosities noted  No results found for this or any previous visit (from the past 24 hour(s)).   Assessment & Plan:  Leslie Hess was seen today for gynecologic exam.  Diagnoses and all orders for this visit:  Cervical cancer screening -     Cervicovaginal ancillary only -     Cytology - PAP     This note has been created with Dragon speech recognition software and Engineer, materials. Any transcriptional errors are unintentional.   Kerin Perna, NP 03/12/2023, 9:33 AM

## 2023-03-17 LAB — CERVICOVAGINAL ANCILLARY ONLY
Bacterial Vaginitis (gardnerella): NEGATIVE
Candida Glabrata: NEGATIVE
Candida Vaginitis: POSITIVE — AB
Chlamydia: NEGATIVE
Comment: NEGATIVE
Comment: NEGATIVE
Comment: NEGATIVE
Comment: NEGATIVE
Comment: NEGATIVE
Comment: NORMAL
Neisseria Gonorrhea: NEGATIVE
Trichomonas: NEGATIVE

## 2023-03-18 LAB — CYTOLOGY - PAP
Comment: NEGATIVE
Diagnosis: NEGATIVE
High risk HPV: NEGATIVE

## 2023-03-19 ENCOUNTER — Other Ambulatory Visit: Payer: Self-pay

## 2023-03-19 ENCOUNTER — Other Ambulatory Visit (INDEPENDENT_AMBULATORY_CARE_PROVIDER_SITE_OTHER): Payer: Self-pay | Admitting: Primary Care

## 2023-03-19 MED ORDER — FLUCONAZOLE 150 MG PO TABS
150.0000 mg | ORAL_TABLET | Freq: Every day | ORAL | 1 refills | Status: AC
  Filled 2023-03-19: qty 1, 1d supply, fill #0

## 2023-05-07 ENCOUNTER — Other Ambulatory Visit: Payer: Self-pay

## 2023-05-07 ENCOUNTER — Emergency Department (HOSPITAL_COMMUNITY): Payer: Self-pay

## 2023-05-07 ENCOUNTER — Emergency Department (HOSPITAL_COMMUNITY)
Admission: EM | Admit: 2023-05-07 | Discharge: 2023-05-07 | Disposition: A | Discharge status: Home / Self Care | Payer: Self-pay | Attending: Emergency Medicine | Admitting: Emergency Medicine

## 2023-05-07 DIAGNOSIS — R0789 Other chest pain: Secondary | ICD-10-CM | POA: Insufficient documentation

## 2023-05-07 DIAGNOSIS — R519 Headache, unspecified: Secondary | ICD-10-CM | POA: Insufficient documentation

## 2023-05-07 LAB — BASIC METABOLIC PANEL
Anion gap: 8 (ref 5–15)
BUN: 7 mg/dL (ref 6–20)
CO2: 24 mmol/L (ref 22–32)
Calcium: 8.9 mg/dL (ref 8.9–10.3)
Chloride: 107 mmol/L (ref 98–111)
Creatinine, Ser: 0.82 mg/dL (ref 0.44–1.00)
GFR, Estimated: >60 mL/min (ref >60)
Glucose, Bld: 97 mg/dL (ref 70–99)
Potassium: 3.8 mmol/L (ref 3.5–5.1)
Sodium: 139 mmol/L (ref 135–145)

## 2023-05-07 LAB — CBC
HCT: 37.9 % (ref 36.0–46.0)
Hemoglobin: 12.1 g/dL (ref 12.0–15.0)
MCH: 27.3 pg (ref 26.0–34.0)
MCHC: 31.9 g/dL (ref 30.0–36.0)
MCV: 85.4 fL (ref 80.0–100.0)
Platelets: 287 10*3/uL (ref 150–400)
RBC: 4.44 MIL/uL (ref 3.87–5.11)
RDW: 13.3 % (ref 11.5–15.5)
WBC: 5.9 10*3/uL (ref 4.0–10.5)
nRBC: 0 % (ref 0.0–0.2)

## 2023-05-07 LAB — I-STAT BETA HCG BLOOD, ED (MC, WL, AP ONLY): I-stat hCG, quantitative: <5 m[IU]/mL (ref <5)

## 2023-05-07 LAB — TROPONIN I (HIGH SENSITIVITY): Troponin I (High Sensitivity): <2 ng/L (ref <18)

## 2023-05-07 MED ORDER — IBUPROFEN 400 MG PO TABS
600.0000 mg | ORAL_TABLET | Freq: Once | ORAL | Status: AC
  Administered 2023-05-07: 600 mg via ORAL
  Filled 2023-05-07: qty 1

## 2023-05-07 MED ORDER — SODIUM CHLORIDE 0.9 % IV BOLUS
1000.0000 mL | Freq: Once | INTRAVENOUS | Status: AC
  Administered 2023-05-07: 1000 mL via INTRAVENOUS

## 2023-05-07 NOTE — Discharge Instructions (Addendum)
Please take Tylenol and Motrin for your symptoms at home.  You can take 1000 mg of Tylenol every 6 hours and 600 mg of ibuprofen every 6 hours as needed for your symptoms.  You can take these medicines together as needed, either at the same time, or alternating every 3 hours.  Tome Tylenol y Motrin para sus sntomas en casa. Puede tomar 1000 mg de Tylenol cada 6 horas y 600 mg de ibuprofeno cada 6 horas segn sea necesario para sus sntomas. Puede tomar estos medicamentos juntos segn sea necesario, al mismo tiempo o alternndolos cada 3 horas.

## 2023-05-07 NOTE — ED Notes (Signed)
Pt in XRAY 

## 2023-05-07 NOTE — ED Triage Notes (Addendum)
Pt/translator stated, yesterday my heart feels like its speeding up. When I stand up I feel like Im going to faint. When I inhale my chest hurts. I also feel dizzy and have a headache .

## 2023-05-07 NOTE — ED Provider Notes (Signed)
[place] EMERGENCY DEPARTMENT AT [facility] Provider Note  CSN: [id number] Arrival date & time: 05/07/23 4098  Chief Complaint(s) Chest Pain, Headache, and Dizziness  HPI Leslie Hess is a 42 y.o. female presenting to the emergency department with chest pain.  Patient reports anterior chest pain which does not radiate.  Worse with inhaling.  Not exertional.  No cough.  No runny nose or sore throat.  No productive cough.  No hemoptysis.  No recent travel or surgeries.  No history of blood clots.  No leg pain or leg swelling.  No fevers or chills.  No nausea or vomiting.  No diaphoresis.  No syncope.  Has been present for around 3 days.  Has not taken anything for symptoms.  She also reports chronic headache reports this is identical to previous headaches.  She reports that she has seen neurology for this.  Also reports occasionally feeling lightheaded on standing.  No syncope.  No numbness, tingling, weakness, neck stiffness, fevers or chills.  No vision changes.   Past Medical History Past Medical History:  Diagnosis Date   Medical history non-contributory    There are no problems to display for this patient.  Home Medication(s) Prior to Admission medications   Medication Sig Start Date End Date Taking? Authorizing Provider  fluconazole (DIFLUCAN) 150 MG tablet Take 1 tablet (150 mg total) by mouth daily. 03/19/23   Grayce Sessions, NP  famotidine (PEPCID) 20 MG tablet Take 1 tablet (20 mg total) by mouth 2 (two) times daily. 12/04/22   Carlisle Beers, FNP  gabapentin (NEURONTIN) 100 MG capsule Take 1 capsule (100 mg total) by mouth at bedtime for 7 days, THEN 2 capsules (200 mg total) at bedtime for 7 days, THEN 3 capsules (300 mg total) at bedtime for 23 days. 08/27/22 10/12/22  Drema Dallas, DO  ibuprofen (ADVIL) 400 MG tablet Take 1 tablet (400 mg total) by mouth every 8 (eight) hours as needed. 11/13/21   Grayce Sessions, NP  nortriptyline  (PAMELOR) 10 MG capsule Take 1 capsule (10 mg total) by mouth at bedtime. 01/20/23   Drema Dallas, DO  pravastatin (PRAVACHOL) 40 MG tablet Take 1 tablet (40 mg total) by mouth daily. 02/03/23   Grayce Sessions, NP                                                                                                                                    Past Surgical History Past Surgical History:  Procedure Laterality Date   NO PAST SURGERIES     Family History Family History  Problem Relation Age of Onset   Diabetes Mother    Hypertension Mother    Diabetes Maternal Grandmother     Social History Social History   Tobacco Use   Smoking status: Never   Smokeless tobacco: Never  Vaping Use   Vaping Use: Never used  Substance Use Topics  Alcohol use: No   Drug use: No   Allergies Fish allergy  Review of Systems Review of Systems  All other systems reviewed and are negative.   Physical Exam Vital Signs  I have reviewed the triage vital signs BP 102/67   Pulse 71   Resp 18   LMP 05/06/2023   SpO2 100%  Physical Exam Vitals and nursing note reviewed.  Constitutional:      General: She is not in acute distress.    Appearance: She is well-developed.  HENT:     Head: Normocephalic and atraumatic.     Mouth/Throat:     Mouth: Mucous membranes are moist.  Eyes:     Pupils: Pupils are equal, round, and reactive to light.  Cardiovascular:     Rate and Rhythm: Normal rate and regular rhythm.     Heart sounds: No murmur heard. Pulmonary:     Effort: Pulmonary effort is normal. No respiratory distress.     Breath sounds: Normal breath sounds.  Chest:     Chest wall: Tenderness (reproduces pain) present.  Abdominal:     General: Abdomen is flat.     Palpations: Abdomen is soft.     Tenderness: There is no abdominal tenderness.  Musculoskeletal:        General: No tenderness.     Cervical back: Neck supple. No rigidity.     Right lower leg: No edema.     Left lower leg:  No edema.  Skin:    General: Skin is warm and dry.  Neurological:     General: No focal deficit present.     Mental Status: She is alert. Mental status is at baseline.     Comments: Cranial nerves II through XII intact, strength 5 out of 5 in the bilateral upper and lower extremities, no sensory deficit to light touch   Psychiatric:        Mood and Affect: Mood normal.        Behavior: Behavior normal.     ED Results and Treatments Labs (all labs ordered are listed, but only abnormal results are displayed) Labs Reviewed  BASIC METABOLIC PANEL  CBC  I-STAT BETA HCG BLOOD, ED ([facility], [facility], [facility] ONLY)  TROPONIN I (HIGH SENSITIVITY)                                                                                                                          Radiology DG Chest 2 View  Result Date: 05/07/2023 CLINICAL DATA:  Chest pain EXAM: CHEST - 2 VIEW COMPARISON:  None FINDINGS: The heart size and mediastinal contours are within normal limits. Both lungs are clear. The visualized skeletal structures are unremarkable. IMPRESSION: No focal airspace opacity. Electronically Signed   By: Lorenza Cambridge M.D.   On: 05/07/2023 10:23    Pertinent labs & imaging results that were available during my care of the patient were reviewed by me and considered in my medical decision making (see MDM  for details).  Medications Ordered in ED Medications  ibuprofen (ADVIL) tablet 600 mg (600 mg Oral Given 05/07/23 1113)  sodium chloride 0.9 % bolus 1,000 mL (1,000 mLs Intravenous New Bag/Given 05/07/23 1113)                                                                                                                                     Procedures Procedures  (including critical care time)  Medical Decision Making / ED Course   MDM:  42 year old female presenting to the emergency department with chest pain and chronic headache.  Patient well-appearing, ECG is unremarkable.  Physical exam notable for  reproducible chest pain with palpation of chest wall.  Neurologic exam is reassuring.  Suspect musculoskeletal chest wall pain.  Patient PERC negative.  Extremely low concern for ACS, EKG not suspicious, history extremely atypical.  Chest x-ray without evidence of pneumonia, pneumothorax.  No falls or trauma to suggest rib fracture.  Troponin ordered in triage, will follow-up.  Patient also reports chronic headaches.  Sounds similar to tension headaches or cervicogenic headache.  Has seen neurology for the same.  Neurologic exam is normal.  She denies any change in her headaches.  She does report follow-up with neurology.  Will treat symptoms with ibuprofen.  With normal neurologic exam, extremely low concern for acute intracranial process such as bleeding, intracranial tumor or mass effect, meningitis or encephalitis, or other acute process. .  Clinical Course as of 05/07/23 1323  Thu May 07, 2023  1322 Patient feels better.  Laboratory testing reassuring including troponin which is undetectable.  Patient has had symptoms for multiple days so doubt utility of checking delta troponin.  Suspect chest wall pain. Will discharge patient to home. All questions answered. Patient comfortable with plan of discharge. Return precautions discussed with patient and specified on the after visit summary.  [WS]    Clinical Course User Index [WS] Suezanne Jacquet, Jerilee Field, MD     Additional history obtained: -Additional history obtained from family -External records from outside source obtained and reviewed including: Chart review including previous notes, labs, imaging, consultation notes including previous neurology notes   Lab Tests: -I ordered, reviewed, and interpreted labs.   The pertinent results include:   Labs Reviewed  BASIC METABOLIC PANEL  CBC  I-STAT BETA HCG BLOOD, ED ([facility], [facility], [facility] ONLY)  TROPONIN I (HIGH SENSITIVITY)    Notable for normal results  EKG   EKG  Interpretation  Date/Time:  Thursday May 07 2023 09:26:25 EDT Ventricular Rate:  85 PR Interval:  155 QRS Duration: 80 QT Interval:  367 QTC Calculation: 437 R Axis:   67 Text Interpretation: Sinus rhythm Confirmed by Alvino Blood ([id number]) on 05/07/2023 9:41:31 AM         Imaging Studies ordered: I ordered imaging studies including CXR On my interpretation imaging demonstrates no acute process I independently visualized and interpreted imaging. I agree with the  radiologist interpretation   Medicines ordered and prescription drug management: Meds ordered this encounter  Medications   ibuprofen (ADVIL) tablet 600 mg   sodium chloride 0.9 % bolus 1,000 mL    -I have reviewed the patients home medicines and have made adjustments as needed   Cardiac Monitoring: The patient was maintained on a cardiac monitor.  I personally viewed and interpreted the cardiac monitored which showed an underlying rhythm of: NSR  Reevaluation: After the interventions noted above, I reevaluated the patient and found that their symptoms have improved  Co morbidities that complicate the patient evaluation  Past Medical History:  Diagnosis Date   Medical history non-contributory       Dispostion: Disposition decision including need for hospitalization was considered, and patient discharged from emergency department.    Final Clinical Impression(s) / ED Diagnoses Final diagnoses:  Atypical chest pain     This chart was dictated using voice recognition software.  Despite best efforts to proofread,  errors can occur which can change the documentation meaning.    Lonell Grandchild, MD 05/07/23 1323

## 2023-05-22 ENCOUNTER — Ambulatory Visit (INDEPENDENT_AMBULATORY_CARE_PROVIDER_SITE_OTHER): Payer: Self-pay | Admitting: Primary Care

## 2023-06-12 ENCOUNTER — Encounter (INDEPENDENT_AMBULATORY_CARE_PROVIDER_SITE_OTHER): Payer: Self-pay | Admitting: Primary Care

## 2023-06-12 ENCOUNTER — Ambulatory Visit (INDEPENDENT_AMBULATORY_CARE_PROVIDER_SITE_OTHER): Payer: Self-pay | Admitting: Primary Care

## 2023-06-12 VITALS — BP 114/79 | HR 75 | Resp 16 | Wt 149.0 lb

## 2023-06-12 DIAGNOSIS — E782 Mixed hyperlipidemia: Secondary | ICD-10-CM

## 2023-06-13 LAB — LIPID PANEL
Chol/HDL Ratio: 4 ratio (ref 0.0–4.4)
Cholesterol, Total: 201 mg/dL — ABNORMAL HIGH (ref 100–199)
HDL: 50 mg/dL (ref >39)
LDL Chol Calc (NIH): 130 mg/dL — ABNORMAL HIGH (ref 0–99)
Triglycerides: 120 mg/dL (ref 0–149)
VLDL Cholesterol Cal: 21 mg/dL (ref 5–40)

## 2023-06-14 NOTE — Progress Notes (Signed)
   Established Patient Office Visit  Subjective   Patient ID: Leslie Hess    DOB: 03/17/1981  Age: 42 y.o. MRN: [id number]  Hyperlipidemia   Leslie Hess is a 42 year old Hispanic female in today for follow-up on hyperlipidemia.  She has been taking pravastatin 40 mg daily without any problems or concerns.  Blood pressure is well-controlled. Patient has No headache, No chest pain, No abdominal pain - No Nausea, No new weakness tingling or numbness, No Cough - shortness of breath    Active Ambulatory Problems    Diagnosis Date Noted   No Active Ambulatory Problems   Resolved Ambulatory Problems    Diagnosis Date Noted   No Resolved Ambulatory Problems   Past Medical History:  Diagnosis Date   Medical history non-contributory      ROS  Comprehensive ROS Pertinent positive and negative noted in HPI     Objective:    Blood Pressure 114/79   Pulse 75   Respiration 16   Weight 149 lb (67.6 kg)   Oxygen Saturation 99%   Body Mass Index 29.10 kg/m   Physical Exam Vitals reviewed.  Constitutional:      Appearance: Normal appearance. She is obese.  HENT:     Head: Normocephalic.     Right Ear: External ear normal.     Left Ear: External ear normal.     Nose: Nose normal.  Cardiovascular:     Rate and Rhythm: Normal rate and regular rhythm.  Pulmonary:     Effort: Pulmonary effort is normal.     Breath sounds: Normal breath sounds.  Abdominal:     General: Bowel sounds are normal.     Palpations: Abdomen is soft.  Musculoskeletal:        General: Normal range of motion.  Skin:    General: Skin is warm and dry.  Neurological:     Mental Status: She is alert and oriented to person, place, and time.  Psychiatric:        Mood and Affect: Mood normal.        Behavior: Behavior normal.      No results found for any visits on 11/06/22.   The ASCVD Risk score (Arnett DK, et al., 2019) failed to calculate for the following reasons:   The  systolic blood pressure is missing   Cannot find a previous HDL lab   Cannot find a previous total cholesterol lab    Assessment & Plan:  [name] was seen today for hyperlipidemia.  Diagnoses and all orders for this visit:  Mixed hyperlipidemia -     Lipid Panel   Grayce Sessions, NP

## 2023-06-19 ENCOUNTER — Other Ambulatory Visit (INDEPENDENT_AMBULATORY_CARE_PROVIDER_SITE_OTHER): Payer: Self-pay | Admitting: Primary Care

## 2023-06-19 ENCOUNTER — Other Ambulatory Visit: Payer: Self-pay

## 2023-06-19 MED ORDER — PRAVASTATIN SODIUM 40 MG PO TABS
40.0000 mg | ORAL_TABLET | Freq: Every day | ORAL | 1 refills | Status: DC
  Filled 2023-06-19 – 2023-09-18 (×2): qty 90, 90d supply, fill #0

## 2023-06-29 NOTE — Progress Notes (Unsigned)
NEUROLOGY FOLLOW UP OFFICE NOTE  Avea Pavloff [id number]  Assessment/Plan:   1  Cervicogenic headache 2  Bilateral occipital neuralgia 3  Right sided cervicalgia 4. Near syncope   Nortriptyline 10mg  at bedtime.  We can increase dose to 25mg  at bedtime if needed. Limit use of pain relievers to no more than 2 days out of week to prevent risk of rebound or medication-overuse headache. Suggested referral to cardiology for cardiac workup of near syncope.  She will first discuss with her PCP. Follow up 6 months.     Subjective:  Marce Scurry is a 42 year old right-handed female who follows up for headache.   UPDATE: Started nortriptyline.  She does note some improvement.  Headaches and neck pain have resolved but still gets dizzy, like she is going to faint.  It occurs spontaneously but not positional.  Sometimes it feels like her heartbeat "jumps".  She went to the ED on 5/9 for atypical chest pain.  EKG and troponin were negative for acute coronary syndrome.  No evidence of anemia.      Current NSAIDS/analgesics:  ibuprofen, sometimes ASA Current triptans:  none Current ergotamine:  none Current anti-emetic:  none Current muscle relaxants:  none Current Antihypertensive medications:  none Current Antidepressant medications:  nortriptyline 10mg  at bedtime  Current Anticonvulsant medications:  none Current anti-CGRP:  none Current Vitamins/Herbal/Supplements:  none Current Antihistamines/Decongestants:  none Other therapy:  none Hormone/birth control:  none   HISTORY: Onset:  since 2019 after her husband had a stroke.  No injury.   Location:  starts from back of neck radiating to top of head Quality:  pressure, throbbing Intensity:  usually 4-5/10 but 9/10 when most severe Aura:  absent Prodrome:  absen Associated symptoms:  Photophobia.  Scalp tender to palpation.  Paresthesia over crown/back of head.  Right lateral neck/trapezius pain.  When  severe, feels lightheaded like she may pass out but doesn't.  She denies associated nausea, vomiting, phonophobia, visual disturbance, unilateral numbness or weakness. Duration:  1 hour with ibuprofen.  Usually wakes up with it in the morning Frequency:  4-5 days a week, 1 day a week is severe Frequency of abortive medication: ibuprofen 1-2 days a week Triggers:  unknown Relieving factors:  sometimes moving neck helps, acetaminophen, ibuprofen Activity:  able to function   Cervical spine X-ray on 01/27/2019 was normal.       Past NSAIDS/analgesics:  naproxen, acetaminophen, tramadol Past abortive triptans:  none Past abortive ergotamine:  none Past muscle relaxants:  cyclobenzaprine Past anti-emetic:  none Past antihypertensive medications:  none Past antidepressant medications:  duloxetine Past anticonvulsant medications:  gabapentin Past anti-CGRP:  none Past vitamins/Herbal/Supplements:  none Past antihistamines/decongestants:  none Other past therapies:  none     Family history of headache:  no  PAST MEDICAL HISTORY: Past Medical History:  Diagnosis Date   Medical history non-contributory     MEDICATIONS: Current Outpatient Medications on File Prior to Visit  Medication Sig Dispense Refill   fluconazole (DIFLUCAN) 150 MG tablet Take 1 tablet (150 mg total) by mouth daily. 1 tablet 1   famotidine (PEPCID) 20 MG tablet Take 1 tablet (20 mg total) by mouth 2 (two) times daily. 30 tablet 0   gabapentin (NEURONTIN) 100 MG capsule Take 1 capsule (100 mg total) by mouth at bedtime for 7 days, THEN 2 capsules (200 mg total) at bedtime for 7 days, THEN 3 capsules (300 mg total) at bedtime for 23 days. 90 capsule 0  ibuprofen (ADVIL) 400 MG tablet Take 1 tablet (400 mg total) by mouth every 8 (eight) hours as needed. 90 tablet 0   nortriptyline (PAMELOR) 10 MG capsule Take 1 capsule (10 mg total) by mouth at bedtime. 30 capsule 5   pravastatin (PRAVACHOL) 40 MG tablet Take 1 tablet  (40 mg total) by mouth daily. 90 tablet 1   No current facility-administered medications on file prior to visit.    ALLERGIES: Allergies  Allergen Reactions   Fish Allergy Itching    Blisters on skin    FAMILY HISTORY: Family History  Problem Relation Age of Onset   Diabetes Mother    Hypertension Mother    Diabetes Maternal Grandmother       Objective:  Blood pressure 108/62, pulse 90, height 4\' 11"  (1.499 m), weight 150 lb (68 kg), SpO2 99 %. General: No acute distress.  Patient appears well-groomed.      Shon Millet, DO  CC: Gwinda Passe, NP

## 2023-07-01 ENCOUNTER — Ambulatory Visit (INDEPENDENT_AMBULATORY_CARE_PROVIDER_SITE_OTHER): Payer: Self-pay | Admitting: Neurology

## 2023-07-01 ENCOUNTER — Encounter: Payer: Self-pay | Admitting: Neurology

## 2023-07-01 ENCOUNTER — Other Ambulatory Visit: Payer: Self-pay

## 2023-07-01 VITALS — BP 108/62 | HR 90 | Ht 59.0 in | Wt 150.0 lb

## 2023-07-01 DIAGNOSIS — M5481 Occipital neuralgia: Secondary | ICD-10-CM

## 2023-07-01 DIAGNOSIS — R55 Syncope and collapse: Secondary | ICD-10-CM

## 2023-07-01 DIAGNOSIS — G4486 Cervicogenic headache: Secondary | ICD-10-CM

## 2023-07-01 MED ORDER — NORTRIPTYLINE HCL 10 MG PO CAPS
10.0000 mg | ORAL_CAPSULE | Freq: Every day | ORAL | 5 refills | Status: DC
  Filled 2023-07-01 – 2023-09-18 (×2): qty 30, 30d supply, fill #0

## 2023-07-01 NOTE — Patient Instructions (Addendum)
Continue nortriptyline 10mg  at bedtime.  If headaches get worse, contact me Discuss with your primary care provider about possible cardiac workup for the dizziness Limit use of pain relievers to no more than 2 days out of week to prevent risk of rebound or medication-overuse headache. Follow up in 6 months.   Contine nortriptilina 10 mg antes de acostarse.  Si los dolores de [place], [place]. Hable con su proveedor de [employer] un posible examen cardaco para los mareos. Limite el uso de analgsicos a no ms de 2 das por [employer] para Multimedia programmer riesgo de dolor de cabeza por rebote o por uso excesivo de medicamentos. Seguimiento en 6 meses.

## 2023-07-13 ENCOUNTER — Encounter: Payer: Self-pay | Admitting: Neurology

## 2023-07-17 ENCOUNTER — Telehealth: Payer: Self-pay | Admitting: *Deleted

## 2023-07-17 NOTE — Telephone Encounter (Signed)
Pt given lab results per notes of M. Edwards, NP from 06/19/23 on 07/17/23. Pt verbalized understanding.  Your cholesterol higher than expected. High cholesterol may increase risk of heart attack and/or stroke. Consider eating more fruits, vegetables, and lean baked meats such as chicken or fish. Moderate intensity exercise at least 150 minutes as tolerated per week may help as well.  Take your pravastatin 40mg   Written by Grayce Sessions, NP on 06/19/2023  4:00 PM EDT

## 2023-07-20 NOTE — Telephone Encounter (Signed)
Noted  

## 2023-08-06 ENCOUNTER — Ambulatory Visit (INDEPENDENT_AMBULATORY_CARE_PROVIDER_SITE_OTHER): Payer: Self-pay | Admitting: Primary Care

## 2023-08-06 ENCOUNTER — Telehealth (INDEPENDENT_AMBULATORY_CARE_PROVIDER_SITE_OTHER): Payer: Self-pay | Admitting: Primary Care

## 2023-08-06 ENCOUNTER — Encounter (INDEPENDENT_AMBULATORY_CARE_PROVIDER_SITE_OTHER): Payer: Self-pay | Admitting: Primary Care

## 2023-08-06 VITALS — BP 112/77 | HR 79 | Temp 98.2°F | Ht 59.0 in | Wt 149.4 lb

## 2023-08-06 DIAGNOSIS — E6609 Other obesity due to excess calories: Secondary | ICD-10-CM

## 2023-08-06 DIAGNOSIS — E66811 Other obesity due to excess calories: Secondary | ICD-10-CM

## 2023-08-06 DIAGNOSIS — Z6830 Body mass index (BMI) 30.0-30.9, adult: Secondary | ICD-10-CM

## 2023-08-06 DIAGNOSIS — G44209 Tension-type headache, unspecified, not intractable: Secondary | ICD-10-CM

## 2023-08-06 DIAGNOSIS — R55 Syncope and collapse: Secondary | ICD-10-CM

## 2023-08-06 NOTE — Telephone Encounter (Signed)
Pt will be at apt

## 2023-08-06 NOTE — Progress Notes (Signed)
[facility]  Leslie Hess, is a 42 y.o. female  [id number]  [id number]  DOB - [birth year]  Chief Complaint  Patient presents with   Referral    Neurology informed her that she needs to see a cardiologist  Having upper left abdominal pain.       Subjective:   Leslie Hess is a 42 y.o. Hispanic female  (interpret Leslie Hess [id number]) here today for a acute visit. Patient has headache,  chest pain, and abdominal pain. She denies - No Nausea, No new weakness tingling or numbness, No Cough - shortness of breath. Stated the neurologist stated she needed to f/u with a cardiologist EKG and troponin were level. What question this s/s has been present since her husband had a stroke she was the only care giver. Patient is adamant about being evaluated by cardiology referral will be place.  No problems updated.  Allergies  Allergen Reactions   Fish Allergy Itching    Blisters on skin    Past Medical History:  Diagnosis Date   Medical history non-contributory     Current Outpatient Medications on File Prior to Visit  Medication Sig Dispense Refill   famotidine (PEPCID) 20 MG tablet Take 1 tablet (20 mg total) by mouth 2 (two) times daily. 30 tablet 0   fluconazole (DIFLUCAN) 150 MG tablet Take 1 tablet (150 mg total) by mouth daily. 1 tablet 1   gabapentin (NEURONTIN) 100 MG capsule Take 1 capsule (100 mg total) by mouth at bedtime for 7 days, THEN 2 capsules (200 mg total) at bedtime for 7 days, THEN 3 capsules (300 mg total) at bedtime for 23 days. 90 capsule 0   ibuprofen (ADVIL) 400 MG tablet Take 1 tablet (400 mg total) by mouth every 8 (eight) hours as needed. 90 tablet 0   nortriptyline (PAMELOR) 10 MG capsule Take 1 capsule (10 mg total) by mouth at bedtime. 30 capsule 5   pravastatin (PRAVACHOL) 40 MG tablet Take 1 tablet (40 mg total) by mouth daily. 90 tablet 1   No current facility-administered medications on file prior to visit.     Objective:   Vitals:   08/06/23 1416  BP: 112/77  Pulse: 79  Temp: 98.2 F (36.8 C)  TempSrc: Oral  SpO2: 99%  Weight: 149 lb 6.4 oz (67.8 kg)  Height: 4\' 11"  (1.499 m)    Comprehensive ROS Pertinent positive and negative noted in HPI   Exam General appearance : Awake, alert, not in any distress. Speech Clear. Not toxic looking HEENT: Atraumatic and Normocephalic, pupils equally reactive to light and accomodation Neck: Supple, no JVD. No cervical lymphadenopathy.  Chest: Good air entry bilaterally, no added sounds  CVS: S1 S2 regular, no murmurs.  Abdomen: Bowel sounds present, Non tender and not distended with no gaurding, rigidity or rebound. Extremities: B/L Lower Ext shows no edema, both legs are warm to touch Neurology: Awake alert, and oriented X 3, CN II-XII intact, Non focal Skin: No Rash  Data Review Lab Results  Component Value Date   HGBA1C 5.9 (H) 01/29/2023   HGBA1C 5.6 02/04/2022   HGBA1C 5.2 12/15/2018    Assessment & Plan  [name] was seen today for referral.  Diagnoses and all orders for this visit:  Near syncope -     Ambulatory referral to Cardiology  Tension-type headache, not intractable, unspecified chronicity pattern Followed by neurology   Class 1 obesity due to excess calories without serious comorbidity with body mass index (BMI) of 30.0 to  30.9 in adult Obesity is 30-39 indicating an excess in caloric intake or underlining conditions. This may lead to other co-morbidities. Educated on lifestyle modifications of diet and exercise which may reduce obesity.     Patient have been counseled extensively about nutrition and exercise. Other issues discussed during this visit include: low cholesterol diet, weight control and daily exercise, foot care, annual eye examinations at Ophthalmology, importance of adherence with medications and regular follow-up. We also discussed long term complications of uncontrolled diabetes and hypertension.    No follow-ups on file.  The patient was given clear instructions to go to ER or return to medical center if symptoms don't improve, worsen or new problems develop. The patient verbalized understanding. The patient was told to call to get lab results if they haven't heard anything in the next week.   This note has been created with Education officer, environmental. Any transcriptional errors are unintentional.   Grayce Sessions, NP 08/06/2023, 2:51 PM

## 2023-08-06 NOTE — Patient Instructions (Addendum)
Recuento de caloras para bajar de peso Calorie Counting for [employer] Loss Las caloras son unidades de Engineer, drilling. El cuerpo necesita una cierta cantidad de caloras de los alimentos para que lo ayuden a funcionar durante todo Medical laboratory scientific officer. Cuando se comen o beben ms caloras de las que el cuerpo [place], este acumula las caloras adicionales mayormente como grasa. Cuando se comen o beben menos caloras de las que el cuerpo [place], este quema grasa para obtener la energa que necesita. El recuento de caloras es el registro de la cantidad de caloras que se comen y Audiological scientist. El recuento de caloras puede ser de ayuda si necesita perder peso. Si come menos caloras de las que el cuerpo necesita, debera bajar de [place]. Pregntele al mdico cul es un peso sano para usted. Para que el recuento de caloras funcione, usted tendr que ingerir la cantidad de caloras adecuadas cada da, para bajar una cantidad de peso saludable por semana. Un nutricionista puede ayudar a determinar la cantidad de caloras que usted necesita por da y sugerirle formas de Barista su objetivo calrico. Neomia Dear cantidad de peso saludable para bajar cada semana suele ser entre 1 y 2 libras (0.5 a 0.9 kg). Esto habitualmente significa que su ingesta diaria de caloras se debera reducir en unas 500 a 750 caloras. Ingerir de 1200 a 1500 caloras por Clinical research associate a la [employer] de las mujeres a Publishing copy de [place]. Ingerir de 1500 a 1800 caloras por Clinical research associate a la [employer] de los hombres a Publishing copy de [place]. Qu debo saber acerca del recuento de caloras? Trabaje con el mdico o el nutricionista para determinar cuntas caloras debe recibir Management consultant. A fin de alcanzar su objetivo diario de caloras, tendr que: Averiguar cuntas caloras hay en cada alimento que le Lobbyist. Intente hacerlo antes de comer. Decidir la cantidad que puede comer del alimento. Llevar un registro de los alimentos. Para esto, anote lo que comi y cuntas  caloras tena. Para perder peso con xito, es importante equilibrar el recuento de caloras con un estilo de vida saludable que incluya actividad fsica de forma regular. Dnde encuentro informacin sobre las caloras?  Es posible Veterinary surgeon cantidad de caloras que contiene un alimento en la etiqueta de informacin nutricional. Si un alimento no tiene una etiqueta de informacin nutricional, intente buscar las caloras en Internet o pida ayuda al nutricionista. Recuerde que las caloras se calculan por porcin. Si opta por comer ms de una porcin de un alimento, tendr [employer] las caloras de una porcin por la cantidad de porciones que planea comer. Por ejemplo, la etiqueta de un envase de pan puede decir que el tamao de una porcin es 1 rodaja, y que una porcin tiene 90 caloras. Si come 1 rodaja, habr comido 90 caloras. Si come 2 rodajas, habr comido 180 caloras. Cmo llevo un registro de comidas? Despus de cada vez que coma, anote lo siguiente en el registro de alimentos lo antes posible: Lo que comi. Asegrese de [employer], las salsas y otros extras [employer]. La cantidad que comi. Esto se puede medir en tazas, onzas o cantidad de alimentos. Cuntas caloras haba en cada alimento y en cada bebida. La cantidad total de caloras en la comida que tom. Tenga a Scientific laboratory technician de alimentos, por ejemplo, en un anotador de bolsillo o utilice una aplicacin o sitio web en el telfono mvil. Algunos programas calcularn las caloras por usted y Insurance account manager la cantidad de  caloras que le quedan para llegar al objetivo diario. Cules son algunos consejos para controlar las porciones? Sepa cuntas caloras hay en una porcin. Esto lo ayudar a saber cuntas porciones de un alimento determinado puede comer. Use una taza medidora para medir los tamaos de las porciones. Tambin Hydrographic surveyor las porciones en una balanza de cocina. Con el tiempo, podr hacer  un clculo estimativo de los tamaos de las porciones de algunos alimentos. Dedique tiempo a poner porciones de diferentes alimentos en sus platos, tazones y tazas predilectos, a fin de saber cmo se ve una porcin. Intente no comer directamente de un envase de alimentos, por ejemplo, de una bolsa o una caja. Comer directamente del envase dificulta ver cunto est comiendo y puede conducir a Actuary. Ponga la cantidad [employer] gustara comer en una taza o un plato, a fin de asegurarse de que est comiendo la porcin correcta. Use platos, vasos y tazones ms pequeos para medir porciones ms pequeas y Automotive engineer no comer en exceso. Intente no realizar varias tareas al [employer]. Por ejemplo, evite mirar televisin o usar la [employer] come. Si es la hora de comer, sintese a Museum/gallery conservator y disfrute de Chemical engineer. Esto lo ayudar a Public house manager cundo est satisfecho. Tambin le permitir estar ms consciente de qu come y cunto come. Consejos para seguir Surveyor, minerals Al leer las etiquetas de los alimentos Controle el recuento de caloras en comparacin con el tamao de la porcin. El tamao de la porcin puede ser ms pequeo de lo que suele comer. Verifique la fuente de las caloras. Intente elegir alimentos ricos en protenas, fibras y vitaminas, y bajos en grasas saturadas, grasas trans y [place]. Al ir de compras Lea las etiquetas nutricionales cuando compre. Esto lo ayudar a tomar decisiones saludables sobre qu alimentos comprar. Preste atencin a las etiquetas nutricionales de alimentos bajos en grasas o sin grasas. Estos alimentos a veces tienen la misma cantidad de caloras o ms caloras que las versiones ricas en grasas. Con frecuencia, tambin tienen agregados de azcar, almidn o sal, para darles el sabor que fue eliminado con las grasas. Haga una lista de compras con los alimentos que tienen un menor contenido de caloras y Leisure centre manager. Al cocinar Intente cocinar sus alimentos preferidos  de una manera ms saludable. Por ejemplo, pruebe hornear en vez de frer. Utilice productos lcteos descremados. Planificacin de las comidas Utilice ms frutas y verduras. La mitad de su plato debe ser de frutas y verduras. Incluya protenas magras, como pollo, pavo y [place]. Estilo de [employer], trate de hacer una de las siguientes cosas: 150 minutos de ejercicio moderado, como caminar. 75 minutos de ejercicio enrgico, como correr. Informacin general Sepa cuntas caloras tienen los alimentos que come con ms frecuencia. Esto le ayudar a contar las caloras ms rpidamente. Encuentre un mtodo para controlar las caloras que funcione para usted. Sea creativo. Pruebe aplicaciones o programas distintos, si llevar un registro de las caloras no funciona para usted. Qu alimentos debo consumir?  Consuma alimentos nutritivos. Es mejor comer un alimento nutritivo, de alto contenido calrico, como un aguacate, que uno con pocos nutrientes, como una bolsa de patatas fritas. Use sus caloras en alimentos y bebidas que lo sacien y no lo dejen con apetito apenas termina de comer. Ejemplos de alimentos que lo sacian son los frutos secos y Civil engineer, contracting de frutos secos, verduras, Associate Professor y Forensic scientist con alto contenido de Research scientist (life sciences) como los cereales integrales. Los alimentos con [employer]  contenido de [place] son aquellos que tienen ms de 5 g de fibra por porcin. Preste atencin a las [employer] Brands. Las bebidas de bajas caloras incluyen agua y refrescos sin International aid/development worker. Es posible que los productos que se enumeran ms [place] no constituyan una lista completa de los alimentos y las bebidas que puede tomar. Consulte a un nutricionista para obtener ms informacin. Qu alimentos debo limitar? Limite el consumo de alimentos o bebidas que no sean buenas fuentes de vitaminas, minerales o protenas, o que tengan alto contenido de grasas no saludables. Estos incluyen: Caramelos. Otros  dulces. Refrescos, bebidas con caf especiales, alcohol y [place]. Es posible que los productos que se enumeran ms [place] no constituyan una lista completa de los alimentos y las bebidas que Personnel officer. Consulte a un nutricionista para obtener ms informacin. Cmo puedo hacer el recuento de caloras cuando como afuera? Preste atencin a las porciones. A menudo, las porciones son mucho ms grandes al comer afuera. Pruebe con estos consejos para mantener las porciones ms pequeas: Considere la posibilidad de compartir una comida en lugar de tomarla toda usted solo. Si pide su propia comida, coma solo la mitad. Antes de empezar a comer, pida un recipiente y ponga la mitad de la comida en l. Cuando sea posible, considere la posibilidad de pedir porciones ms pequeas del men en lugar de porciones completas. Preste atencin a Soil scientist de alimentos y bebidas. Saber la forma en que se cocinan los alimentos y lo que incluye la comida puede ayudarlo a ingerir menos caloras. Si se detallan las caloras en el men, elija las opciones que contengan la menor cantidad. Elija platos que incluyan verduras, frutas, cereales integrales, productos lcteos con bajo contenido de grasa y Associate Professor. Opte por los alimentos hervidos, asados, cocidos a la parrilla o al vapor. Evite los alimentos a los que se les ponga mantequilla, que estn empanados o fritos, o que se sirvan con salsa a base de crema. Generalmente, los alimentos que se etiquetan como "crujientes" estn fritos, a menos que se indique lo contrario. Elija el agua, la [place], [place] t helado sin azcar u otras bebidas que no contengan azcares agregados. Si desea una bebida alcohlica, escoja una opcin con menos caloras, como una copa de vino o una cerveza ligera. Ordene los [employer], las salsas y los jarabes aparte. Estos son, con frecuencia, de alto contenido en caloras, por lo que debe limitar la cantidad que ingiere. Si desea [place], elija una de hortalizas y pida carnes a la parrilla. Evite las guarniciones adicionales como el tocino, el queso o los alimentos fritos. Ordene el aderezo aparte o pida aceite de [place] y vinagre o limn para Emergency planning/management officer. Haga un clculo estimativo de la cantidad de porciones que le sirven. Conocer el tamao de las porciones lo ayudar a Theme park manager atento a la cantidad de comida que come [employer]. Dnde buscar ms informacin Centers for Disease Control and Prevention (Centros para el Control y la Prevencin de Enfermedades): [email or web address] U.S. Department of Agriculture (Departamento de Agricultura de los EE. UU.): [email or web address] Resumen El recuento de caloras es el registro de la cantidad de caloras que se comen y Audiological scientist. Si come menos caloras de las que el cuerpo necesita, debera bajar de [place]. Una cantidad de peso saludable para bajar por semana suele ser entre 1 y 2 libras (0.5 a 0.9 kg). Esto significa, con frecuencia, reducir su ingesta diaria de caloras unas 500 a 750 caloras. Es posible  encontrar la cantidad de caloras que contiene un alimento en la etiqueta de informacin nutricional. Si un alimento no tiene una etiqueta de informacin nutricional, intente buscar las caloras en Internet o pida ayuda al nutricionista. Use platos, vasos y tazones ms pequeos para medir porciones ms pequeas y Automotive engineer no comer en exceso. Use sus caloras en alimentos y bebidas que lo sacien y no lo dejen con apetito poco tiempo despus de haber comido. Esta informacin no tiene Theme park manager el consejo del mdico. Asegrese de hacerle al mdico cualquier pregunta que tenga. Document Revised: 04/09/2020 Document Reviewed: 04/09/2020 Elsevier Patient Education  2023 Elsevier Inc. Dislipidemia Dyslipidemia La dislipidemia es un desequilibrio de sustancias cerosas parecidas a la grasa (lpidos) en la [place]. El cuerpo necesita lpidos en pequeas cantidades. Con frecuencia, la  dislipidemia implica un nivel alto de colesterol o triglicridos, que son tipos de lpidos. Las formas frecuentes de dislipidemia incluyen las siguientes: Niveles elevados de colesterol LDL. El LDL es el tipo de colesterol que causa la acumulacin de depsitos de grasa (placas) en los vasos sanguneos que transportan la sangre fuera del corazn (arterias). Niveles bajos de colesterol HDL. El HDL es el tipo de colesterol que brinda proteccin contra las enfermedades cardacas. Los niveles altos de HDL eliminan la acumulacin de LDL de las arterias. Niveles altos de triglicridos. Los triglicridos son Neomia Dear sustancia grasa presente en la sangre que se relaciona con la acumulacin de placa en las arterias. Cules son las causas? Hay dos tipos principales de dislipidemia: primaria y [place]. La dislipidemia primaria es causada por cambios (mutaciones) en los genes que se transmiten a travs de las familias (se heredan). Estas mutaciones causan varios tipos de dislipidemia. La dislipidemia secundaria puede ser causada por diversos factores de riesgo que pueden provocar la enfermedad, como las opciones de estilo de vida y Theatre manager. Qu incrementa el riesgo? Tiene ms probabilidades de Aeronautical engineer afeccin si es un hombre mayor o si es una mujer que ha pasado por la menopausia. Otros factores de riesgo son los siguientes: Tener antecedentes familiares de dislipidemia. Tomar determinados medicamentos, entre ellos, pldoras anticonceptivas, corticoesteroides, algunos diurticos y betabloqueantes. Seguir una dieta con alto contenido de grasas saturadas. Fumar cigarrillos o beber alcohol en exceso. Tener ciertas afecciones mdicas, como diabetes, sndrome de ovario poliqustico (SOP), enfermedad renal, enfermedad heptica o hipotiroidismo. No hacer ejercicio regularmente. Tener sobrepeso o ser obeso con demasiada grasa en el abdomen. Cules son los signos o sntomas? En la [employer], la dislipidemia no causa ningn sntoma. En los [place] graves, los niveles muy altos de lpidos pueden causar: Protuberancias de grasa debajo de la piel (xantomas). Un anillo blanco o gris alrededor del centro negro (pupila) del ojo. Los niveles muy altos de triglicridos pueden causar inflamacin del pncreas (pancreatitis). Cmo se diagnostica? Su mdico puede diagnosticar dislipidemia basndose en un anlisis de sangre de rutina (anlisis de sangre en [place]). Como la [employer]ley[employer]son de las personas no tienen sntomas de la afeccin, este anlisis de sangre (perfil de lpidos) se realiza en adultos mayores de 20 aos y se repite cada 4 a 6 aos. En este anlisis, se controla lo siguiente: Colesterol total. Esto mide la cantidad total de colesterol en la sangre, que incluye el colesterol LDL, el colesterol HDL y los triglicridos. Un valor saludable est por debajo de 200 mg/dl (1.47 mmol/l). Colesterol LDL. El valor objetivo de colesterol LDL es diferente para cada persona, en funcin de los factores de riesgo individuales. Un valor saludable  suele estar por debajo de 100 mg/dl (5.62 mmol/l). Consulte al mdico cul debe ser el valor del colesterol LDL para usted. Colesterol HDL. Un nivel de colesterol HDL de 60 mg/dl (1.30 mmol/l) o superior es lo mejor porque ayuda a Health visitor las enfermedades cardacas. Un valor por debajo de 40 mg/dl (8.65 mmol/l) para los hombres o por debajo de 50 mg/dl (7.84 mmol/l) para las mujeres aumenta el riesgo de enfermedad cardaca. Triglicridos. Un valor de triglicridos saludable est por debajo de 150 mg/dl (6.96 mmol/l). Si su perfil de lpidos es anormal, su mdico puede realizar otros anlisis de [place]. Cmo se trata? El tratamiento depende del tipo de dislipidemia que usted tenga y sus otros factores de riesgo de enfermedades cardacas o accidente cerebrovascular. Su mdico tendr un rango objetivo para sus niveles de lpidos en funcin de esta  informacin. El tratamiento para la dislipidemia comienza con cambios en el estilo de vida, tales como dieta y ejercicio. El mdico podra recomendarle que haga lo siguiente: Hacer ejercicio con regularidad. Realizar cambios en la dieta. Si fuma, dejar de hacerlo. Limitar el consumo de bebidas alcohlicas. Si los cambios en la dieta y la actividad fsica no ayudan a Barista sus objetivos, el mdico tambin puede recetarle medicamentos para disminuir los lpidos. El tipo de medicamento recetado con ms frecuencia disminuye el colesterol LDL (estatinas). Si tiene Publishing copy de triglicridos, su mdico puede recetarle otro tipo de frmaco (fibratos) o un suplemento de aceite de pescado con omega-3, o ambos. Siga estas instrucciones en su casa: Comida y bebida  Siga las indicaciones del mdico o el nutricionista respecto de las restricciones para las comidas o las bebidas. Siga una dieta saludable como se lo haya indicado el mdico. Esto puede ayudarle a Barista y Pharmacologist un peso saludable, reducir el colesterol LDL y aumentar el colesterol HDL. Puede incluir: Limitar sus caloras, si tiene sobrepeso. Comer ms frutas, verduras, cereales integrales, pescado y carnes magras. Limitar las grasas saturadas, las grasas trans y Print production planner. No beba alcohol si: Su mdico le indica no hacerlo. Est embarazada, puede estar embarazada o est tratando de [place]. Si bebe alcohol: Limite la cantidad que bebe a lo siguiente: De 0 a 1 medida por da para las mujeres. De 0 a 2 medidas por da para los hombres. Sepa cunta cantidad de alcohol hay en las bebidas que toma. En los [address], una medida equivale a una botella de cerveza de 12 oz (355 ml), un vaso de vino de 5 oz (148 ml) o un vaso de una bebida alcohlica de alta graduacin de 1 oz (44 ml). Actividad Haga ejercicio con regularidad. Siga un programa de ejercicio y entrenamiento de fuerza tal como se lo haya indicado el mdico.  Pregntele al mdico qu actividades son seguras para usted. El mdico puede recomendarle lo siguiente: 30 minutos de [place] de 4 a 6 das por [address] La caminata a paso ligero es un ejemplo de [place]. Entrenamiento de fuerza 2 [employer]. Indicaciones generales No consuma ningn producto que contenga nicotina o tabaco. Estos productos incluyen cigarrillos, tabaco para Theatre manager y aparatos de vapeo, como los Administrator, Civil Service. Si necesita ayuda para dejar de consumir estos productos, consulte al [employer]. Use los medicamentos de venta libre y los recetados solamente como se lo haya indicado el mdico. Esto incluye los suplementos. Concurra a todas las visitas de seguimiento. Esto es importante. Comunquese con un mdico si: Tiene dificultad para cumplir con su plan de [place]  fsica o su dieta. Le cuesta dejar de fumar o controlar el consumo de alcohol. Resumen Con frecuencia, la dislipidemia implica un nivel alto de colesterol o triglicridos, que son tipos de lpidos. El tratamiento depende del tipo de dislipidemia que usted tenga y sus otros factores de riesgo de enfermedades cardacas o accidente cerebrovascular. El tratamiento para la dislipidemia comienza con cambios en el estilo de vida, tales como dieta y ejercicio. Su mdico puede recetarle medicamentos para disminuir los lpidos. Esta informacin no tiene Theme park manager el consejo del mdico. Asegrese de hacerle al mdico cualquier pregunta que tenga. Document Revised: 03/01/2021 Document Reviewed: 03/01/2021 Elsevier Patient Education  2024 [employer].

## 2023-09-18 ENCOUNTER — Other Ambulatory Visit: Payer: Self-pay

## 2023-09-22 ENCOUNTER — Other Ambulatory Visit: Payer: Self-pay

## 2023-10-08 ENCOUNTER — Encounter: Payer: Self-pay | Admitting: Cardiovascular Disease

## 2023-10-08 ENCOUNTER — Ambulatory Visit: Payer: Self-pay | Attending: Cardiovascular Disease | Admitting: Cardiovascular Disease

## 2023-10-08 DIAGNOSIS — R519 Headache, unspecified: Secondary | ICD-10-CM

## 2023-10-08 DIAGNOSIS — Z13220 Encounter for screening for lipoid disorders: Secondary | ICD-10-CM

## 2023-10-08 DIAGNOSIS — R0789 Other chest pain: Secondary | ICD-10-CM

## 2023-10-08 DIAGNOSIS — M940 Chondrocostal junction syndrome [Tietze]: Secondary | ICD-10-CM

## 2023-10-08 DIAGNOSIS — G8929 Other chronic pain: Secondary | ICD-10-CM

## 2023-10-08 DIAGNOSIS — R55 Syncope and collapse: Secondary | ICD-10-CM

## 2023-10-08 DIAGNOSIS — K219 Gastro-esophageal reflux disease without esophagitis: Secondary | ICD-10-CM

## 2023-10-08 NOTE — Patient Instructions (Signed)
Medication Instructions:  Purchase Ibuprofen for chest wall pain from your local pharmacy  Purchase Pepcid as well for chest wall pain from your local pharmacy   *If you need a refill on your cardiac medications before your next appointment, please call your pharmacy*   Lab Work: CMET, TSH, LIPID, LPa If you have labs (blood work) drawn today and your tests are completely normal, you will receive your results only by: MyChart Message (if you have MyChart) OR A paper copy in the mail If you have any lab test that is abnormal or we need to change your treatment, we will call you to review the results.   Testing/Procedures: -calcium score   Follow-Up: At [facility], you and your health needs are our priority.  As part of our continuing mission to provide you with exceptional heart care, we have created designated Provider Care Teams.  These Care Teams include your primary Cardiologist (physician) and Advanced Practice Providers (APPs -  Physician Assistants and Nurse Practitioners) who all work together to provide you with the care you need, when you need it.  We recommend signing up for the patient portal called "MyChart".  Sign up information is provided on this After Visit Summary.  MyChart is used to connect with patients for Virtual Visits (Telemedicine).  Patients are able to view lab/test results, encounter notes, upcoming appointments, etc.  Non-urgent messages can be sent to your provider as well.   To learn more about what you can do with MyChart, go to [email or web address].    Your next appointment:    Pending results, may see available APP  Provider:   Azalee Course, PA-C

## 2023-10-08 NOTE — Progress Notes (Signed)
Cardiology Office Note    Date:  10/12/2023   ID:  Leslie Hess, DOB [birth year], MRN [id number]  PCP:  Grayce Sessions, NP  Cardiologist:  Nicki Guadalajara, MD   New cardiology consult referred by Dr. Shon Millet of neurology and Gwinda Passe, NP primary care for evaluation of chest pain.  The evaluation was done with a Spanish interpreter Grayce Sessions  [phone])   History of Present Illness:  Leslie Hess is a 42 y.o. female from [place].  She sees Gwinda Passe, NP for primary care so has seen Dr. Everlena Cooper of neurology.  She has a history of cervicogenic headache, bilateral occipital neuralgia, and right sided cervicalgia.  He had been recently evaluated in the emergency room with chest pain, headache and dizziness.  The chest pain was nonexertional.  Is worse with taking a deep breath.  She reported that time she noted feeling lightheaded upon standing but denied any frank syncope.  During the ER evaluation from May 07, 2023 blood pressure was 102/67 pulse 71.  Chest x-ray was unremarkable.  She has been on nortriptyline at bedtime with recent dose adjustment by Dr. Shon Millet of neurology.  She has noticed some vague chest pressure which is nonexertional.  She also admits to some GERD.  She believes she is sleeping well but does admit to some mild snoring.  Laboratory on May 07, 2023 showed hemoglobin 12.1 hematocrit 37.9.  On June 12, 2023 lipid study showed total cholesterol 201, triglycerides 120, HDL 50, and LDL 130.  She now presents for cardiology consultation referred by Dr. Everlena Cooper.   Past Medical History:  Diagnosis Date   Medical history non-contributory     Past Surgical History:  Procedure Laterality Date   NO PAST SURGERIES      Current Medications: Outpatient Medications Prior to Visit  Medication Sig Dispense Refill   ibuprofen (ADVIL) 400 MG tablet Take 1 tablet (400 mg total) by mouth every 8 (eight) hours as needed. 90 tablet 0   pravastatin  (PRAVACHOL) 40 MG tablet Take 1 tablet (40 mg total) by mouth daily. 90 tablet 1   famotidine (PEPCID) 20 MG tablet Take 1 tablet (20 mg total) by mouth 2 (two) times daily. (Patient not taking: Reported on 10/08/2023) 30 tablet 0   fluconazole (DIFLUCAN) 150 MG tablet Take 1 tablet (150 mg total) by mouth daily. (Patient not taking: Reported on 10/08/2023) 1 tablet 1   gabapentin (NEURONTIN) 100 MG capsule Take 1 capsule (100 mg total) by mouth at bedtime for 7 days, THEN 2 capsules (200 mg total) at bedtime for 7 days, THEN 3 capsules (300 mg total) at bedtime for 23 days. 90 capsule 0   nortriptyline (PAMELOR) 10 MG capsule Take 1 capsule (10 mg total) by mouth at bedtime. (Patient not taking: Reported on 10/08/2023) 30 capsule 5   No facility-administered medications prior to visit.     Allergies:   Fish allergy   Social History   Socioeconomic History   Marital status: Married    Spouse name: Not on file   Number of children: Not on file   Years of education: Not on file   Highest education level: Not on file  Occupational History   Not on file  Tobacco Use   Smoking status: Never   Smokeless tobacco: Never  Vaping Use   Vaping status: Never Used  Substance and Sexual Activity   Alcohol use: No   Drug use: No   Sexual activity: Yes  Birth control/protection: I.U.D.  Other Topics Concern   Not on file  Social History Narrative   Right handed   Social Determinants of Health   Financial Resource Strain: Not on file  Food Insecurity: Not on file  Transportation Needs: Not on file  Physical Activity: Not on file  Stress: Not on file  Social Connections: Not on file    Mostly she was born in [place].  She is married for 7 years and has 3 children ages 36, 35 and 21.  There is no tobacco use.  She admits to walking 30 minutes on most days without exertional chest pain precipitation.  Family History:  The patient's family history includes Diabetes in her maternal  grandmother and mother; Hypertension in her mother.  Her mother has diabetes and is 25 years old.  Father is 60.  ROS General: Negative; No fevers, chills, or night sweats;  HEENT: Negative; No changes in vision or hearing, sinus congestion, difficulty swallowing Pulmonary: Negative; No cough, wheezing, shortness of breath, hemoptysis Cardiovascular: See HPI GI: Negative; No nausea, vomiting, diarrhea, or abdominal pain GU: Negative; No dysuria, hematuria, or difficulty voiding Musculoskeletal: Negative; no myalgias, joint pain, or weakness Hematologic/Oncology: Negative; no easy bruising, bleeding Endocrine: Negative; no heat/cold intolerance; no diabetes Neuro: Frequent headaches, bilateral occipital neuralgia.  Right sided cervicalgia Skin: Negative; No rashes or skin lesions Psychiatric: Negative; No behavioral problems, depression Sleep: Negative; No snoring, daytime sleepiness, hypersomnolence, bruxism, restless legs, hypnogognic hallucinations, no cataplexy Other comprehensive 14 point system review is negative.   PHYSICAL EXAM:   VS:  BP 118/80   Pulse 73   Ht 5' (1.524 m)   Wt 151 lb (68.5 kg)   LMP 10/04/2023   SpO2 100%   BMI 29.49 kg/m     Repeat blood pressure by me was 112/76  Wt Readings from Last 3 Encounters:  10/08/23 151 lb (68.5 kg)  08/06/23 149 lb 6.4 oz (67.8 kg)  07/01/23 150 lb (68 kg)    General: Alert, oriented, no distress.  Skin: normal turgor, no rashes, warm and dry HEENT: Normocephalic, atraumatic. Pupils equal round and reactive to light; sclera anicteric; extraocular muscles intact;  Nose without nasal septal hypertrophy Mouth/Parynx benign; Mallinpatti scale 3 Neck: No JVD, no carotid bruits; normal carotid upstroke Lungs: clear to ausculatation and percussion; no wheezing or rales Chest wall: Chest wall tenderness to palpation over the lower costochondral region bilaterally mimics her chest pain. Heart: PMI not displaced, RRR, s1 s2  normal, 1/6 systolic murmur, no diastolic murmur, no rubs, gallops, thrills, or heaves Abdomen: soft, nontender; no hepatosplenomehaly, BS+; abdominal aorta nontender and not dilated by palpation. Back: no CVA tenderness Pulses 2+ Musculoskeletal: full range of motion, normal strength, no joint deformities Extremities: no clubbing cyanosis or edema, Homan's sign negative  Neurologic: grossly nonfocal; Cranial nerves grossly wnl Psychologic: Normal mood and affect   Studies/Labs Reviewed:   I personally reviewed the ECG from her ER evaluation on May 07, 2023 which showed normal sinus rhythm at 85 bpm without ST-T abnormalities.  Normal intervals, no ectopy  Recent Labs:    Latest Ref Rng & Units 10/08/2023   11:56 AM 05/07/2023   10:11 AM 01/29/2023    3:56 PM  BMP  Glucose 70 - 99 mg/dL 90  97  96   BUN 6 - 24 mg/dL 10  7  8    Creatinine 0.57 - 1.00 mg/dL 4.54  0.98  1.19   BUN/Creat Ratio 9 - 23 16  10   Sodium 134 - 144 mmol/L 140  139  140   Potassium 3.5 - 5.2 mmol/L 4.1  3.8  3.9   Chloride 96 - 106 mmol/L 104  107  104   CO2 20 - 29 mmol/L 25  24  22    Calcium 8.7 - 10.2 mg/dL 9.2  8.9  9.1         Latest Ref Rng & Units 10/08/2023   11:56 AM 01/29/2023    3:56 PM 02/05/2022    8:39 AM  Hepatic Function  Total Protein 6.0 - 8.5 g/dL 7.1  7.3  7.0   Albumin 3.9 - 4.9 g/dL 4.6  4.8  4.4   AST 0 - 40 IU/L 22  21  22    ALT 0 - 32 IU/L 21  17  20    Alk Phosphatase 44 - 121 IU/L 83  89  82   Total Bilirubin 0.0 - 1.2 mg/dL 0.2  0.2  0.4        Latest Ref Rng & Units 05/07/2023   10:11 AM 01/29/2023    3:56 PM 02/05/2022    8:39 AM  CBC  WBC 4.0 - 10.5 K/uL 5.9  7.0  6.6   Hemoglobin 12.0 - 15.0 g/dL 34.7  42.5  95.6   Hematocrit 36.0 - 46.0 % 37.9  38.0  37.9   Platelets 150 - 400 K/uL 287  306  293    Lab Results  Component Value Date   MCV 85.4 05/07/2023   MCV 84 01/29/2023   MCV 84 02/05/2022   Lab Results  Component Value Date   TSH 0.979 10/08/2023   Lab  Results  Component Value Date   HGBA1C 5.9 (H) 01/29/2023     BNP No results found for: "BNP"  ProBNP No results found for: "PROBNP"   Lipid Panel     Component Value Date/Time   CHOL 181 10/08/2023 1156   TRIG 107 10/08/2023 1156   HDL 49 10/08/2023 1156   CHOLHDL 3.7 10/08/2023 1156   CHOLHDL 3.9 Ratio 03/20/2010 1950   VLDL 29 03/20/2010 1950   LDLCALC 113 (H) 10/08/2023 1156   LABVLDL 19 10/08/2023 1156     RADIOLOGY: No results found.   Additional studies/ records that were reviewed today include:  Viewed the records of [place] [facility] ER evaluation from May 07, 2023.  Records of Dr. Everlena Cooper were reviewed.  Records of [place], [place] were reviewed.  ASSESSMENT:    1. Chest wall pain   2. Costochondritis   3. Mild dizziness   4. Hyperlipidemia   5. Gastroesophageal reflux disease, unspecified whether esophagitis present   6. Chronic nonintractable headache, unspecified headache type     PLAN:  Floree Zuniga is a very pleasant 42 year old female from [place] who speaks predominantly [place].  I did speak with her in Spanish today and also had an interpreter, Alexandro [phone]) assist with the evaluation.  Ms. Sondra Come has been followed by Dr. Everlena Cooper of neurology for chronic headaches and is felt to have cervicogenic headache, bilateral occipital neuralgia, right-sided neuralgia and has been on nortriptyline therapy which recently was increased from 10 to 25 mg at bedtime if needed.  There also aperients some neck pain and at times has felt slight dizziness which is nonpositional.  Had been evaluated in the emergency room on May 07, 2023 for chest pain.  It was felt that her chest pain was noncardiac and most likely musculoskeletal.  ECG and troponins were negative.  Laboratory did not reveal any evidence for anemia.  On physical examination, she has significant chest wall tenderness to palpation and I suspect her chest pain is costochondral and musculoskeletal in  etiology.  I am recommending new set of follow-up fasting laboratory with comprehensive metabolic panel, TSH, fasting lipid studies and will check an LP(a).  She has been on pravastatin 40 mg for hyperlipidemia.  With her elevated LDL cholesterol documented previously, I am scheduling her to undergo a coronary calcium score to assess for underlying coronary calcification and early CAD.  If she does have significant calcification and if her LP(a) is significantly elevated much more aggressive lipid-lowering therapy may be necessary.  She also has GERD symptomatology.  For her chest wall pain, I have recommended ibuprofen to take as needed.  For her GERD I have recommended over-the-counter Pepcid.  On exam, her blood pressure is stable.  Her ECG from her ER evaluation showed sinus rhythm without ectopy with heart rate at 85 bpm.   Medication Adjustments/Labs and Tests Ordered: Current medicines are reviewed at length with the patient today.  Concerns regarding medicines are outlined above.  Medication changes, Labs and Tests ordered today are listed in the Patient Instructions below. Patient Instructions  Medication Instructions:  Purchase Ibuprofen for chest wall pain from your local pharmacy  Purchase Pepcid as well for chest wall pain from your local pharmacy   *If you need a refill on your cardiac medications before your next appointment, please call your pharmacy*   Lab Work: CMET, TSH, LIPID, LPa If you have labs (blood work) drawn today and your tests are completely normal, you will receive your results only by: MyChart Message (if you have MyChart) OR A paper copy in the mail If you have any lab test that is abnormal or we need to change your treatment, we will call you to review the results.   Testing/Procedures: -calcium score   Follow-Up: At [facility], you and your health needs are our priority.  As part of our continuing mission to provide you with exceptional heart  care, we have created designated Provider Care Teams.  These Care Teams include your primary Cardiologist (physician) and Advanced Practice Providers (APPs -  Physician Assistants and Nurse Practitioners) who all work together to provide you with the care you need, when you need it.  We recommend signing up for the patient portal called "MyChart".  Sign up information is provided on this After Visit Summary.  MyChart is used to connect with patients for Virtual Visits (Telemedicine).  Patients are able to view lab/test results, encounter notes, upcoming appointments, etc.  Non-urgent messages can be sent to your provider as well.   To learn more about what you can do with MyChart, go to [email or web address].    Your next appointment:    Pending results, may see available APP  Provider:   Azalee Course, PA-C           Signed, Nicki Guadalajara, MD  10/12/2023 1:35 PM    [facility] [address], [address], [place], [place]  [address] Phone: [phone]

## 2023-10-09 LAB — COMPREHENSIVE METABOLIC PANEL
ALT: 21 [IU]/L (ref 0–32)
AST: 22 [IU]/L (ref 0–40)
Albumin: 4.6 g/dL (ref 3.9–4.9)
Alkaline Phosphatase: 83 [IU]/L (ref 44–121)
BUN/Creatinine Ratio: 16 (ref 9–23)
BUN: 10 mg/dL (ref 6–24)
Bilirubin Total: 0.2 mg/dL (ref 0.0–1.2)
CO2: 25 mmol/L (ref 20–29)
Calcium: 9.2 mg/dL (ref 8.7–10.2)
Chloride: 104 mmol/L (ref 96–106)
Creatinine, Ser: 0.64 mg/dL (ref 0.57–1.00)
Globulin, Total: 2.5 g/dL (ref 1.5–4.5)
Glucose: 90 mg/dL (ref 70–99)
Potassium: 4.1 mmol/L (ref 3.5–5.2)
Sodium: 140 mmol/L (ref 134–144)
Total Protein: 7.1 g/dL (ref 6.0–8.5)
eGFR: 114 mL/min/{1.73_m2} (ref >59)

## 2023-10-09 LAB — LIPID PANEL
Chol/HDL Ratio: 3.7 {ratio} (ref 0.0–4.4)
Cholesterol, Total: 181 mg/dL (ref 100–199)
HDL: 49 mg/dL (ref >39)
LDL Chol Calc (NIH): 113 mg/dL — ABNORMAL HIGH (ref 0–99)
Triglycerides: 107 mg/dL (ref 0–149)
VLDL Cholesterol Cal: 19 mg/dL (ref 5–40)

## 2023-10-09 LAB — TSH: TSH: 0.979 u[IU]/mL (ref 0.450–4.500)

## 2023-10-09 LAB — LIPOPROTEIN A (LPA): Lipoprotein (a): 12.9 nmol/L (ref <75.0)

## 2023-10-12 ENCOUNTER — Encounter: Payer: Self-pay | Admitting: Cardiovascular Disease

## 2023-10-15 ENCOUNTER — Ambulatory Visit (HOSPITAL_COMMUNITY)
Admission: RE | Admit: 2023-10-15 | Discharge: 2023-10-15 | Disposition: A | Discharge status: Home / Self Care | Payer: Self-pay | Source: Ambulatory Visit | Attending: Cardiovascular Disease | Admitting: Cardiovascular Disease

## 2023-10-15 DIAGNOSIS — R0789 Other chest pain: Secondary | ICD-10-CM | POA: Insufficient documentation

## 2023-10-15 DIAGNOSIS — R55 Syncope and collapse: Secondary | ICD-10-CM | POA: Insufficient documentation

## 2023-10-19 ENCOUNTER — Telehealth: Payer: Self-pay | Admitting: Cardiovascular Disease

## 2023-10-19 NOTE — Telephone Encounter (Signed)
Patient returned call for her test results.  

## 2023-10-20 NOTE — Telephone Encounter (Signed)
    Lennette Bihari, MD 10/16/2023  6:21 PM EDT     Coronary calcium score is excellent at 0.   Used Arts administrator: Interpreter # [id number] to call this patient. Left voicemail (dpr) with the information above.

## 2023-10-21 NOTE — Telephone Encounter (Signed)
Spoke to pt regarding test results (coronary ca score=0) who stated that someone had already called her and left a voicemail with this info.  She had called the office back because we had called her again today and she wasn't sure if there was something pending.  I apologized and let her know that there was nothing new and that her test was WNL.  She verbalized understanding.  All info was given to pt in [place] Chief Executive Officer).

## 2023-10-21 NOTE — Telephone Encounter (Signed)
Acknowledged, thank you.

## 2023-10-21 NOTE — Telephone Encounter (Signed)
Pt is returning call.  

## 2023-12-03 NOTE — Progress Notes (Signed)
Multiple attempts to reach pt. Sent letter

## 2023-12-29 NOTE — Progress Notes (Signed)
 NEUROLOGY FOLLOW UP OFFICE NOTE  [name] [id number]  Assessment/Plan:   1  Cervicogenic headache 2  Bilateral occipital neuralgia 3  Right sided cervicalgia    Nortriptyline  10mg  at bedtime. Limit use of pain relievers to no more than 2 days out of week to prevent risk of rebound or medication-overuse headache. Follow up 1 year     Subjective:  Leslie Hess is a 42 year old right-handed female who follows up for headache.   UPDATE: Still without headaches.  No longer getting dizzy.  Still may get some neck pian every now and then.      Current NSAIDS/analgesics:  ibuprofen , sometimes ASA Current triptans:  none Current ergotamine:  none Current anti-emetic:  none Current muscle relaxants:  none Current Antihypertensive medications:  none Current Antidepressant medications:  nortriptyline  10mg  at bedtime  Current Anticonvulsant medications:  none Current anti-CGRP:  none Current Vitamins/Herbal/Supplements:  none Current Antihistamines/Decongestants:  none Other therapy:  none Hormone/birth control:  none   HISTORY: Onset:  since 2019 after her husband had a stroke.  No injury.   Location:  starts from back of neck radiating to top of head Quality:  pressure, throbbing Intensity:  usually 4-5/10 but 9/10 when most severe Aura:  absent Prodrome:  absen Associated symptoms:  Photophobia.  Scalp tender to palpation.  Paresthesia over crown/back of head.  Right lateral neck/trapezius pain.  When severe, feels lightheaded like she may pass out but doesn't.  She denies associated nausea, vomiting, phonophobia, visual disturbance, unilateral numbness or weakness. Duration:  1 hour with ibuprofen .  Usually wakes up with it in the morning Frequency:  4-5 days a week, 1 day a week is severe Frequency of abortive medication: ibuprofen  1-2 days a week Triggers:  unknown Relieving factors:  sometimes moving neck helps, acetaminophen ,  ibuprofen  Activity:  able to function   Cervical spine X-ray on 01/27/2019 was normal.       Past NSAIDS/analgesics:  naproxen , acetaminophen , tramadol  Past abortive triptans:  none Past abortive ergotamine:  none Past muscle relaxants:  cyclobenzaprine  Past anti-emetic:  none Past antihypertensive medications:  none Past antidepressant medications:  duloxetine  Past anticonvulsant medications:  gabapentin  Past anti-CGRP:  none Past vitamins/Herbal/Supplements:  none Past antihistamines/decongestants:  none Other past therapies:  none     Family history of headache:  no  PAST MEDICAL HISTORY: Past Medical History:  Diagnosis Date   Medical history non-contributory     MEDICATIONS: Current Outpatient Medications on File Prior to Visit  Medication Sig Dispense Refill   famotidine  (PEPCID ) 20 MG tablet Take 1 tablet (20 mg total) by mouth 2 (two) times daily. (Patient not taking: Reported on 10/08/2023) 30 tablet 0   fluconazole  (DIFLUCAN ) 150 MG tablet Take 1 tablet (150 mg total) by mouth daily. (Patient not taking: Reported on 10/08/2023) 1 tablet 1   gabapentin  (NEURONTIN ) 100 MG capsule Take 1 capsule (100 mg total) by mouth at bedtime for 7 days, THEN 2 capsules (200 mg total) at bedtime for 7 days, THEN 3 capsules (300 mg total) at bedtime for 23 days. 90 capsule 0   ibuprofen  (ADVIL ) 400 MG tablet Take 1 tablet (400 mg total) by mouth every 8 (eight) hours as needed. 90 tablet 0   nortriptyline  (PAMELOR ) 10 MG capsule Take 1 capsule (10 mg total) by mouth at bedtime. (Patient not taking: Reported on 10/08/2023) 30 capsule 5   pravastatin  (PRAVACHOL ) 40 MG tablet Take 1 tablet (40 mg total) by mouth daily. 90 tablet 1  No current facility-administered medications on file prior to visit.    ALLERGIES: Allergies  Allergen Reactions   Fish Allergy Itching    Blisters on skin    FAMILY HISTORY: Family History  Problem Relation Age of Onset   Diabetes Mother     Hypertension Mother    Diabetes Maternal Grandmother       Objective:  Blood pressure 112/66, pulse 78, height 5' 2 (1.575 m), weight 155 lb 6.4 oz (70.5 kg), SpO2 100%. General: No acute distress.  Patient appears well-groomed.   Head:  Normocephalic/atraumatic Neck:  Supple.  No paraspinal tenderness.  Full range of motion. Heart:  Regular rate and rhythm. Neuro:  Alert and oriented.  Speech fluent and not dysarthric.  Language intact.  CN II-XII intact.  Bulk and tone normal.  Muscle strength 5/5 throughout.  Deep tendon reflexes 2+ throughout.  Gait normal.  Romberg negative.    Juliene Dunnings, DO  CC: Rosaline Bohr, NP

## 2023-12-31 ENCOUNTER — Ambulatory Visit (INDEPENDENT_AMBULATORY_CARE_PROVIDER_SITE_OTHER): Payer: No Typology Code available for payment source | Admitting: Neurology

## 2023-12-31 ENCOUNTER — Other Ambulatory Visit: Payer: Self-pay

## 2023-12-31 ENCOUNTER — Encounter: Payer: Self-pay | Admitting: Neurology

## 2023-12-31 VITALS — BP 112/66 | HR 78 | Ht 62.0 in | Wt 155.4 lb

## 2023-12-31 DIAGNOSIS — G4486 Cervicogenic headache: Secondary | ICD-10-CM

## 2023-12-31 DIAGNOSIS — M542 Cervicalgia: Secondary | ICD-10-CM

## 2023-12-31 DIAGNOSIS — M5481 Occipital neuralgia: Secondary | ICD-10-CM

## 2023-12-31 MED ORDER — NORTRIPTYLINE HCL 10 MG PO CAPS
10.0000 mg | ORAL_CAPSULE | Freq: Every day | ORAL | 11 refills | Status: AC
  Filled 2023-12-31 – 2024-02-15 (×2): qty 30, 30d supply, fill #0
  Filled 2024-03-30: qty 30, 30d supply, fill #1
  Filled 2024-05-05: qty 30, 30d supply, fill #2

## 2023-12-31 NOTE — Patient Instructions (Addendum)
 Continue NORTRIPTYLINE  10MG  AT BEDTIME Limit use of pain relievers to no more than 2 days out of week to prevent risk of rebound or medication-overuse headache. Follow up in one year      1. Contine NORTRIPTYLINE  10MG  A LA HORA DE ACOSTARSE 2. Limite el uso de analgsicos a no ms de 2 das por [employer] para geneticist, molecular de rebote o dolor de cabeza por uso excesivo de medicamentos. 3. Seguimiento en un ao

## 2024-01-05 ENCOUNTER — Ambulatory Visit: Payer: Self-pay | Admitting: Neurology

## 2024-01-12 ENCOUNTER — Other Ambulatory Visit: Payer: Self-pay

## 2024-02-08 ENCOUNTER — Encounter (INDEPENDENT_AMBULATORY_CARE_PROVIDER_SITE_OTHER): Payer: Self-pay

## 2024-02-08 ENCOUNTER — Ambulatory Visit (INDEPENDENT_AMBULATORY_CARE_PROVIDER_SITE_OTHER): Payer: Self-pay | Admitting: Primary Care

## 2024-02-08 ENCOUNTER — Encounter (INDEPENDENT_AMBULATORY_CARE_PROVIDER_SITE_OTHER): Payer: Self-pay | Admitting: Primary Care

## 2024-02-08 ENCOUNTER — Other Ambulatory Visit: Payer: Self-pay

## 2024-02-08 VITALS — BP 118/78 | HR 76 | Resp 16 | Ht 62.0 in | Wt 152.0 lb

## 2024-02-08 DIAGNOSIS — R300 Dysuria: Secondary | ICD-10-CM

## 2024-02-08 LAB — POCT URINALYSIS DIP (CLINITEK)
Bilirubin, UA: NEGATIVE
Glucose, UA: NEGATIVE mg/dL
Ketones, POC UA: NEGATIVE mg/dL
Leukocytes, UA: NEGATIVE
Nitrite, UA: NEGATIVE
POC PROTEIN,UA: NEGATIVE
Spec Grav, UA: 1.01 (ref 1.010–1.025)
Urobilinogen, UA: 0.2 U/dL
pH, UA: 6 (ref 5.0–8.0)

## 2024-02-08 MED ORDER — ROSUVASTATIN CALCIUM 20 MG PO TABS
20.0000 mg | ORAL_TABLET | Freq: Every day | ORAL | 1 refills | Status: DC
  Filled 2024-02-08: qty 90, 90d supply, fill #0

## 2024-02-10 ENCOUNTER — Other Ambulatory Visit: Payer: Self-pay

## 2024-02-13 NOTE — Progress Notes (Signed)
 [facility]  Fanny Agan, is a 43 y.o. female  ZOX:[id number]  WJX:[id number]  DOB - 08/30/1981  Chief Complaint  Patient presents with   Dysuria       Subjective:   Mittie Knittel is a 43 y.o. female here today for an acute visit.  Dysuria  This is a new problem. The current episode started 1 to 4 weeks ago. The problem occurs intermittently. The problem has been waxing and waning. The quality of the pain is described as burning. The pain is at a severity of 4/10. The pain is moderate. There has been no fever. She is Sexually active. There is No history of pyelonephritis. She has tried nothing for the symptoms.    No problems updated.  Comprehensive ROS Pertinent positive and negative noted in HPI   Allergies  Allergen Reactions   Fish Allergy Itching    Blisters on skin    Past Medical History:  Diagnosis Date   Medical history non-contributory     Current Outpatient Medications on File Prior to Visit  Medication Sig Dispense Refill   famotidine (PEPCID) 20 MG tablet Take 1 tablet (20 mg total) by mouth 2 (two) times daily. 30 tablet 0   fluconazole (DIFLUCAN) 150 MG tablet Take 1 tablet (150 mg total) by mouth daily. 1 tablet 1   ibuprofen (ADVIL) 400 MG tablet Take 1 tablet (400 mg total) by mouth every 8 (eight) hours as needed. 90 tablet 0   nortriptyline (PAMELOR) 10 MG capsule Take 1 capsule (10 mg total) by mouth at bedtime. 30 capsule 11   rosuvastatin (CRESTOR) 20 MG tablet Take 1 tablet (20 mg total) by mouth daily. 90 tablet 1   No current facility-administered medications on file prior to visit.   Health Maintenance  Topic Date Due   Flu Shot  07/30/2023   COVID-19 Vaccine (1 - 2024-25 season) Never done   Pap with HPV screening  03/11/2028   DTaP/Tdap/Td vaccine (2 - Td or Tdap) 02/05/2032   Hepatitis C Screening  Completed   HIV Screening  Completed   HPV Vaccine  Aged Out    Objective:   Vitals:    02/08/24 1704  BP: 118/78  Pulse: 76  Resp: 16  SpO2: 97%  Weight: 152 lb (68.9 kg)  Height: 5\' 2"  (1.575 m)     Physical Exam Vitals reviewed.  Constitutional:      Appearance: Normal appearance. She is normal weight.  HENT:     Head: Atraumatic.  Pulmonary:     Effort: Pulmonary effort is normal.     Breath sounds: Normal breath sounds.  Abdominal:     General: Bowel sounds are normal. There is distension.     Palpations: Abdomen is soft.  Musculoskeletal:     Comments: Flank pain  Skin:    General: Skin is warm and dry.  Neurological:     Mental Status: She is oriented to person, place, and time.  Psychiatric:        Mood and Affect: Mood normal.        Behavior: Behavior normal.       Assessment & Plan  Ezrah was seen today for dysuria.  Diagnoses and all orders for this visit:  Dysuria -     POCT URINALYSIS DIP (CLINITEK)      Patient have been counseled extensively about nutrition and exercise. Other issues discussed during this visit include: low cholesterol diet, weight control and daily exercise, foot care, annual eye examinations  at Ophthalmology, importance of adherence with medications and regular follow-up. We also discussed long term complications of uncontrolled diabetes and hypertension.   No follow-ups on file.  The patient was given clear instructions to go to ER or return to medical center if symptoms don't improve, worsen or new problems develop. The patient verbalized understanding. The patient was told to call to get lab results if they haven't heard anything in the next week.   This note has been created with Education officer, environmental. Any transcriptional errors are unintentional.   Grayce Sessions, NP 02/13/2024, 9:23 PM

## 2024-02-14 MED ORDER — ROSUVASTATIN CALCIUM 20 MG PO TABS
20.0000 mg | ORAL_TABLET | Freq: Every day | ORAL | 1 refills | Status: AC
  Filled 2024-02-14: qty 90, 90d supply, fill #0

## 2024-02-14 NOTE — Progress Notes (Signed)
 Duplicate chart

## 2024-02-15 ENCOUNTER — Other Ambulatory Visit: Payer: Self-pay

## 2024-02-15 ENCOUNTER — Ambulatory Visit (HOSPITAL_COMMUNITY)
Admission: EM | Admit: 2024-02-15 | Discharge: 2024-02-15 | Disposition: A | Discharge status: Home / Self Care | Payer: No Typology Code available for payment source | Attending: Family Medicine | Admitting: Family Medicine

## 2024-02-15 ENCOUNTER — Encounter (HOSPITAL_COMMUNITY): Payer: Self-pay

## 2024-02-15 DIAGNOSIS — M722 Plantar fascial fibromatosis: Secondary | ICD-10-CM

## 2024-02-15 DIAGNOSIS — M545 Low back pain, unspecified: Secondary | ICD-10-CM

## 2024-02-15 HISTORY — DX: Pure hypercholesterolemia, unspecified: E78.00

## 2024-02-15 MED ORDER — KETOROLAC TROMETHAMINE 30 MG/ML IJ SOLN
INTRAMUSCULAR | Status: AC
  Filled 2024-02-15: qty 1

## 2024-02-15 MED ORDER — MELOXICAM 15 MG PO TABS
15.0000 mg | ORAL_TABLET | Freq: Every day | ORAL | 0 refills | Status: AC
  Filled 2024-02-15: qty 14, 14d supply, fill #0

## 2024-02-15 MED ORDER — KETOROLAC TROMETHAMINE 30 MG/ML IJ SOLN
30.0000 mg | Freq: Once | INTRAMUSCULAR | Status: AC
  Administered 2024-02-15: 30 mg via INTRAMUSCULAR

## 2024-02-15 NOTE — ED Triage Notes (Addendum)
 Per Interpreter-Fernanda #[id number]  Patient reports that she has had bilateral lower back pain x 20 days. Patient denies radiation of pain into her buttocks or legs.  Patient also added that she has bilateral heel pain.   Patient has not had any medications for her symptoms.

## 2024-02-15 NOTE — ED Provider Notes (Signed)
 [facility]    CSN: [id number] Arrival date & time: 02/15/24  0865      History   Chief Complaint Chief Complaint  Patient presents with   Foot Pain   Back Pain    HPI Leslie Hess is a 43 y.o. female.   Patient presenting today for lumbar back pain that is bilateral as well as bilateral heel pain.  Interpreter was used for the conversation.  Patient notes that she has been dealing with some lumbar back pain that does not radiate down the legs but appears to stay in the 3 S1 region.  Patient states that the pain radiates laterally.  Patient has no concern for radiating symptoms down the leg or any weakness or numbness tingling of the lower extremities.  Patient has a back pains been going on for a few days now.  Patient stays at home and does a lot of housework.  Patient notes that she has bilateral heel pain as well.  Patient notes that the pain is throughout the day whenever she is walking on it.  Patient states that sometimes she wears sandals at home and sometimes she does not.  Patient has hardwood floors at home.   Foot Pain  Back Pain   Past Medical History:  Diagnosis Date   High cholesterol    Medical history non-contributory     There are no active problems to display for this patient.   Past Surgical History:  Procedure Laterality Date   NO PAST SURGERIES      OB History     Gravida  3   Para  3   Term  3   Preterm  0   AB  0   Living  3      SAB  0   IAB  0   Ectopic  0   Multiple  0   Live Births  3            Home Medications    Prior to Admission medications   Medication Sig Start Date End Date Taking? Authorizing Provider  meloxicam (MOBIC) 15 MG tablet Take 1 tablet (15 mg total) by mouth daily. 02/15/24  Yes Brenton Grills, MD  famotidine (PEPCID) 20 MG tablet Take 1 tablet (20 mg total) by mouth 2 (two) times daily. 12/04/22   Carlisle Beers, FNP  fluconazole (DIFLUCAN) 150 MG tablet Take 1  tablet (150 mg total) by mouth daily. 03/19/23   Grayce Sessions, NP  ibuprofen (ADVIL) 400 MG tablet Take 1 tablet (400 mg total) by mouth every 8 (eight) hours as needed. 11/13/21   Grayce Sessions, NP  nortriptyline (PAMELOR) 10 MG capsule Take 1 capsule (10 mg total) by mouth at bedtime. 12/31/23   Drema Dallas, DO  rosuvastatin (CRESTOR) 20 MG tablet Take 1 tablet (20 mg total) by mouth daily. 02/14/24   Grayce Sessions, NP    Family History Family History  Problem Relation Age of Onset   Diabetes Mother    Hypertension Mother    Diabetes Maternal Grandmother     Social History Social History   Tobacco Use   Smoking status: Never   Smokeless tobacco: Never  Vaping Use   Vaping status: Never Used  Substance Use Topics   Alcohol use: No   Drug use: No     Allergies   Fish allergy   Review of Systems Review of Systems  Musculoskeletal:  Positive for back pain.  Physical Exam Triage Vital Signs ED Triage Vitals  Encounter Vitals Group     BP 02/15/24 0858 118/79     Systolic BP Percentile --      Diastolic BP Percentile --      Pulse Rate 02/15/24 0858 74     Resp 02/15/24 0858 14     Temp 02/15/24 0858 98.1 F (36.7 C)     Temp Source 02/15/24 0858 Oral     SpO2 02/15/24 0858 98 %     Weight --      Height --      Head Circumference --      Peak Flow --      Pain Score 02/15/24 0902 8     Pain Loc --      Pain Education --      Exclude from Growth Chart --    No data found.  Updated Vital Signs BP 118/79 (BP Location: Left Arm)   Pulse 74   Temp 98.1 F (36.7 C) (Oral)   Resp 14   LMP 02/03/2024   SpO2 98%   Visual Acuity Right Eye Distance:   Left Eye Distance:   Bilateral Distance:    Right Eye Near:   Left Eye Near:    Bilateral Near:     Physical Exam Lumbar: Inspection of the bilateral lumbar spine reveals no gross abnormalities.  There is tenderness to palpation over the paraspinal area bilaterally as well as some  mild tenderness over the glutes medius area.  No concern for any neurological deficit in the lower extremities.  Straight leg test is negative.  Bilateral foot: Inspection reveals no gross abnormalities of the bilateral foot.  There is tenderness to palpation over the plantar fascia of the heel onto its insertion on the calcaneus.  No tenderness over the Achilles tendon itself.  No tenderness over the plantar fascia near the arch.  Range of motion is full without any pain.  UC Treatments / Results  Labs (all labs ordered are listed, but only abnormal results are displayed) Labs Reviewed - No data to display  EKG   Radiology No results found.  Procedures Procedures (including critical care time)  Medications Ordered in UC Medications  ketorolac (TORADOL) 30 MG/ML injection 30 mg (has no administration in time range)    Initial Impression / Assessment and Plan / UC Course  I have reviewed the triage vital signs and the nursing notes.  Pertinent labs & imaging results that were available during my care of the patient were reviewed by me and considered in my medical decision making (see chart for details).     Patient's lumbar pain is likely muscular in nature, at this time we will go ahead and do Toradol injection as well as meloxicam for the next 10 days.  Advised patient to start doing at home exercises for the back which were provided.  Patient's bilateral heel pain is likely related to plantar fasciitis.  Also gave patient exercises which she can start doing at home and recommended that the meloxicam will help with her symptoms.  Also advised patient to try over the counter inserts into her shoes for when she is working at home.  Patient understanding and agreeable with plan. Final Clinical Impressions(s) / UC Diagnoses   Final diagnoses:  Acute bilateral low back pain without sciatica  Plantar fasciitis     Discharge Instructions      Please do the exercises  provided  Please take your meloxicam 1  pill a day for the next 10 days     ED Prescriptions     Medication Sig Dispense Auth. Provider   meloxicam (MOBIC) 15 MG tablet Take 1 tablet (15 mg total) by mouth daily. 14 tablet Brenton Grills, MD      PDMP not reviewed this encounter.   Brenton Grills, MD 02/15/24 1002

## 2024-02-15 NOTE — Discharge Instructions (Addendum)
 Please do the exercises provided  Please take your meloxicam 1 pill a day for the next 10 days

## 2024-02-16 ENCOUNTER — Other Ambulatory Visit: Payer: Self-pay

## 2024-03-29 ENCOUNTER — Other Ambulatory Visit: Payer: Self-pay

## 2024-03-30 ENCOUNTER — Other Ambulatory Visit: Payer: Self-pay

## 2024-04-01 ENCOUNTER — Other Ambulatory Visit: Payer: Self-pay

## 2024-05-05 ENCOUNTER — Other Ambulatory Visit: Payer: Self-pay

## 2024-05-06 ENCOUNTER — Other Ambulatory Visit: Payer: Self-pay

## 2024-08-08 ENCOUNTER — Ambulatory Visit (INDEPENDENT_AMBULATORY_CARE_PROVIDER_SITE_OTHER): Payer: No Typology Code available for payment source | Admitting: Primary Care
